# Patient Record
Sex: Female | Born: 1950 | Race: White | Hispanic: No | Smoking: Current some day smoker
Health system: Southern US, Community
[De-identification: ages and names within clinical notes are randomized; demographics above are authoritative.]

## PROBLEM LIST (undated history)

## (undated) DIAGNOSIS — F419 Anxiety disorder, unspecified: Secondary | ICD-10-CM

## (undated) DIAGNOSIS — I10 Essential (primary) hypertension: Secondary | ICD-10-CM

## (undated) DIAGNOSIS — E039 Hypothyroidism, unspecified: Secondary | ICD-10-CM

## (undated) DIAGNOSIS — T7840XA Allergy, unspecified, initial encounter: Secondary | ICD-10-CM

## (undated) DIAGNOSIS — J449 Chronic obstructive pulmonary disease, unspecified: Secondary | ICD-10-CM

## (undated) DIAGNOSIS — E785 Hyperlipidemia, unspecified: Secondary | ICD-10-CM

## (undated) DIAGNOSIS — M810 Age-related osteoporosis without current pathological fracture: Secondary | ICD-10-CM

## (undated) DIAGNOSIS — L409 Psoriasis, unspecified: Secondary | ICD-10-CM

## (undated) DIAGNOSIS — K219 Gastro-esophageal reflux disease without esophagitis: Secondary | ICD-10-CM

## (undated) DIAGNOSIS — G8929 Other chronic pain: Secondary | ICD-10-CM

## (undated) DIAGNOSIS — M1990 Unspecified osteoarthritis, unspecified site: Secondary | ICD-10-CM

## (undated) DIAGNOSIS — M542 Cervicalgia: Secondary | ICD-10-CM

## (undated) HISTORY — DX: Other chronic pain: G89.29

## (undated) HISTORY — DX: Hyperlipidemia, unspecified: E78.5

## (undated) HISTORY — PX: NECK SURGERY: SHX720

## (undated) HISTORY — DX: Cervicalgia: M54.2

## (undated) HISTORY — PX: APPENDECTOMY: SHX54

## (undated) HISTORY — DX: Allergy, unspecified, initial encounter: T78.40XA

## (undated) HISTORY — DX: Chronic obstructive pulmonary disease, unspecified: J44.9

## (undated) HISTORY — DX: Psoriasis, unspecified: L40.9

## (undated) HISTORY — PX: ABDOMINAL HYSTERECTOMY: SHX81

## (undated) HISTORY — DX: Age-related osteoporosis without current pathological fracture: M81.0

## (undated) HISTORY — DX: Unspecified osteoarthritis, unspecified site: M19.90

---

## 1975-02-22 HISTORY — PX: SPINE SURGERY: SHX786

## 1998-05-08 ENCOUNTER — Inpatient Hospital Stay (HOSPITAL_COMMUNITY): Admission: EM | Admit: 1998-05-08 | Discharge: 1998-05-14 | Payer: Self-pay | Admitting: Emergency Medicine

## 1998-05-08 ENCOUNTER — Encounter: Payer: Self-pay | Admitting: Internal Medicine

## 1998-05-09 ENCOUNTER — Encounter: Payer: Self-pay | Admitting: Internal Medicine

## 1998-05-10 ENCOUNTER — Encounter: Payer: Self-pay | Admitting: Internal Medicine

## 1998-05-12 ENCOUNTER — Encounter: Payer: Self-pay | Admitting: Internal Medicine

## 2001-05-24 ENCOUNTER — Other Ambulatory Visit: Admission: RE | Admit: 2001-05-24 | Discharge: 2001-05-24 | Payer: Self-pay | Admitting: Family Medicine

## 2001-05-29 ENCOUNTER — Encounter: Admission: RE | Admit: 2001-05-29 | Discharge: 2001-05-29 | Payer: Self-pay | Admitting: Family Medicine

## 2001-05-29 ENCOUNTER — Encounter: Payer: Self-pay | Admitting: Family Medicine

## 2001-11-29 ENCOUNTER — Encounter: Admission: RE | Admit: 2001-11-29 | Discharge: 2001-11-29 | Payer: Self-pay | Admitting: Family Medicine

## 2001-11-29 ENCOUNTER — Encounter: Payer: Self-pay | Admitting: Family Medicine

## 2002-12-02 ENCOUNTER — Encounter: Admission: RE | Admit: 2002-12-02 | Discharge: 2002-12-02 | Payer: Self-pay | Admitting: Family Medicine

## 2002-12-02 ENCOUNTER — Encounter: Payer: Self-pay | Admitting: Family Medicine

## 2003-12-31 ENCOUNTER — Ambulatory Visit: Payer: Self-pay | Admitting: Family Medicine

## 2004-02-03 ENCOUNTER — Encounter: Admission: RE | Admit: 2004-02-03 | Discharge: 2004-02-03 | Payer: Self-pay | Admitting: Family Medicine

## 2004-02-09 ENCOUNTER — Ambulatory Visit: Payer: Self-pay | Admitting: Family Medicine

## 2004-04-09 ENCOUNTER — Ambulatory Visit: Payer: Self-pay | Admitting: Family Medicine

## 2004-04-16 ENCOUNTER — Ambulatory Visit: Payer: Self-pay | Admitting: Family Medicine

## 2004-05-24 ENCOUNTER — Ambulatory Visit: Payer: Self-pay | Admitting: Family Medicine

## 2004-12-24 ENCOUNTER — Ambulatory Visit: Payer: Self-pay | Admitting: Family Medicine

## 2005-04-25 ENCOUNTER — Ambulatory Visit: Payer: Self-pay | Admitting: Family Medicine

## 2005-04-25 ENCOUNTER — Encounter: Payer: Self-pay | Admitting: Family Medicine

## 2005-04-25 ENCOUNTER — Other Ambulatory Visit: Admission: RE | Admit: 2005-04-25 | Discharge: 2005-04-25 | Payer: Self-pay | Admitting: Family Medicine

## 2005-04-25 LAB — CONVERTED CEMR LAB: Pap Smear: NORMAL

## 2005-04-26 ENCOUNTER — Ambulatory Visit: Payer: Self-pay | Admitting: Internal Medicine

## 2005-05-02 ENCOUNTER — Encounter: Admission: RE | Admit: 2005-05-02 | Discharge: 2005-05-02 | Payer: Self-pay | Admitting: Family Medicine

## 2005-05-18 ENCOUNTER — Ambulatory Visit: Payer: Self-pay | Admitting: Family Medicine

## 2005-06-01 ENCOUNTER — Ambulatory Visit: Payer: Self-pay | Admitting: Family Medicine

## 2005-07-28 ENCOUNTER — Ambulatory Visit: Payer: Self-pay | Admitting: Family Medicine

## 2006-05-04 ENCOUNTER — Encounter: Admission: RE | Admit: 2006-05-04 | Discharge: 2006-05-04 | Payer: Self-pay | Admitting: Family Medicine

## 2007-02-25 ENCOUNTER — Encounter: Payer: Self-pay | Admitting: Family Medicine

## 2007-02-26 ENCOUNTER — Telehealth: Payer: Self-pay | Admitting: Family Medicine

## 2007-02-26 ENCOUNTER — Ambulatory Visit: Payer: Self-pay | Admitting: Family Medicine

## 2007-02-26 DIAGNOSIS — L408 Other psoriasis: Secondary | ICD-10-CM | POA: Insufficient documentation

## 2007-02-26 DIAGNOSIS — L409 Psoriasis, unspecified: Secondary | ICD-10-CM | POA: Insufficient documentation

## 2007-02-26 DIAGNOSIS — Z87891 Personal history of nicotine dependence: Secondary | ICD-10-CM | POA: Insufficient documentation

## 2007-02-26 DIAGNOSIS — F17200 Nicotine dependence, unspecified, uncomplicated: Secondary | ICD-10-CM | POA: Insufficient documentation

## 2007-02-26 DIAGNOSIS — E785 Hyperlipidemia, unspecified: Secondary | ICD-10-CM | POA: Insufficient documentation

## 2007-02-27 ENCOUNTER — Encounter: Payer: Self-pay | Admitting: Family Medicine

## 2007-12-06 ENCOUNTER — Telehealth: Payer: Self-pay | Admitting: Family Medicine

## 2008-02-06 ENCOUNTER — Ambulatory Visit: Payer: Self-pay | Admitting: Family Medicine

## 2008-02-08 ENCOUNTER — Encounter: Admission: RE | Admit: 2008-02-08 | Discharge: 2008-02-08 | Payer: Self-pay | Admitting: Family Medicine

## 2008-02-08 LAB — CONVERTED CEMR LAB
AST: 21 units/L (ref 0–37)
Albumin: 3.9 g/dL (ref 3.5–5.2)
Alkaline Phosphatase: 65 units/L (ref 39–117)
Basophils Absolute: 0 10*3/uL (ref 0.0–0.1)
Basophils Relative: 0.5 % (ref 0.0–3.0)
Bilirubin, Direct: 0.1 mg/dL (ref 0.0–0.3)
Cholesterol: 233 mg/dL (ref 0–200)
Eosinophils Relative: 1.3 % (ref 0.0–5.0)
GFR calc Af Amer: 95 mL/min
HDL: 48.7 mg/dL (ref >39.0)
Monocytes Absolute: 0.8 10*3/uL (ref 0.1–1.0)
Monocytes Relative: 9 % (ref 3.0–12.0)
Neutro Abs: 5.4 10*3/uL (ref 1.4–7.7)
Platelets: 249 10*3/uL (ref 150–400)
RBC: 4.91 M/uL (ref 3.87–5.11)
Sodium: 143 meq/L (ref 135–145)
TSH: 2.08 microintl units/mL (ref 0.35–5.50)
Total CHOL/HDL Ratio: 4.8
Total Protein: 6.8 g/dL (ref 6.0–8.3)
Triglycerides: 135 mg/dL (ref 0–149)
VLDL: 27 mg/dL (ref 0–40)
WBC: 8.9 10*3/uL (ref 4.5–10.5)

## 2008-02-11 ENCOUNTER — Ambulatory Visit: Payer: Self-pay | Admitting: Family Medicine

## 2008-02-12 ENCOUNTER — Encounter (INDEPENDENT_AMBULATORY_CARE_PROVIDER_SITE_OTHER): Payer: Self-pay | Admitting: *Deleted

## 2008-03-06 ENCOUNTER — Encounter (INDEPENDENT_AMBULATORY_CARE_PROVIDER_SITE_OTHER): Payer: Self-pay | Admitting: *Deleted

## 2008-03-07 ENCOUNTER — Ambulatory Visit: Payer: Self-pay | Admitting: Internal Medicine

## 2008-03-17 ENCOUNTER — Encounter: Payer: Self-pay | Admitting: Internal Medicine

## 2008-03-17 ENCOUNTER — Ambulatory Visit: Payer: Self-pay | Admitting: Internal Medicine

## 2008-03-18 ENCOUNTER — Encounter: Payer: Self-pay | Admitting: Internal Medicine

## 2008-06-10 ENCOUNTER — Ambulatory Visit: Payer: Self-pay | Admitting: Family Medicine

## 2008-06-10 DIAGNOSIS — M542 Cervicalgia: Secondary | ICD-10-CM | POA: Insufficient documentation

## 2008-06-16 ENCOUNTER — Encounter: Admission: RE | Admit: 2008-06-16 | Discharge: 2008-06-16 | Payer: Self-pay | Admitting: Family Medicine

## 2008-07-10 ENCOUNTER — Encounter: Admission: RE | Admit: 2008-07-10 | Discharge: 2008-07-10 | Payer: Self-pay | Admitting: Orthopaedic Surgery

## 2008-07-10 ENCOUNTER — Encounter: Payer: Self-pay | Admitting: Family Medicine

## 2008-08-20 ENCOUNTER — Ambulatory Visit (HOSPITAL_COMMUNITY): Admission: RE | Admit: 2008-08-20 | Discharge: 2008-08-21 | Payer: Self-pay | Admitting: Orthopaedic Surgery

## 2010-03-23 NOTE — Consult Note (Signed)
Summary: [facility]   Imported By: Lanelle Bal 04/17/2009 08:51:54  _____________________________________________________________________  External Attachment:    Type:   Image     Comment:   External Document

## 2010-04-09 ENCOUNTER — Ambulatory Visit (INDEPENDENT_AMBULATORY_CARE_PROVIDER_SITE_OTHER): Payer: BC Managed Care – PPO | Admitting: Family Medicine

## 2010-04-09 ENCOUNTER — Encounter: Payer: Self-pay | Admitting: Family Medicine

## 2010-04-09 DIAGNOSIS — J209 Acute bronchitis, unspecified: Secondary | ICD-10-CM

## 2010-04-09 DIAGNOSIS — F17200 Nicotine dependence, unspecified, uncomplicated: Secondary | ICD-10-CM

## 2010-04-20 NOTE — Assessment & Plan Note (Signed)
Summary: COLD/CLE   BCBS   Vital Signs:  Patient profile:   60 year old female Height:      60 inches Weight:      147.50 pounds BMI:     28.91 Temp:     98.1 degrees F oral Pulse rate:   76 / minute Pulse rhythm:   regular BP sitting:   128 / 76  (left arm) Cuff size:   regular  Vitals Entered By: Lewanda Rife LPN (April 09, 2010 8:14 AM) CC: cold,productive cough with green to clear phlegm. hoarsness.   History of Present Illness: thinks she has a bad cold productive cough with green phlegm and wheeze  a little fever in the beginning   nasal symptoms are getting better no facial pain   took a guifenisen cough med and helping some   still a smoker -- but cut down to 3-4 cig per day     Allergies: 1)  Asa 2)  Lipitor  Past History:  Past Medical History: Last updated: 06/10/2008 Hyperlipidemia psoriasis  tab abuse  allergic rhinitis   Past Surgical History: Last updated: 03/22/2008 Cervical fusion ( Dr. Channing Mutters 1997) Hysterectomy- endometriosis.  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 Eyes:  Denies blurring, discharge, and eye irritation. CV:  Denies chest pain or discomfort, lightheadness, and palpitations. Resp:  Complains of cough, sputum productive, and wheezing. GI:  Denies abdominal pain, change in bowel habits,  indigestion, and nausea. Derm:  Denies itching, lesion(s), poor wound healing, and rash. Neuro:  Denies numbness and tingling.  Physical Exam  General:  Well-developed,well-nourished,in no acute distress; alert,appropriate and cooperative throughout examination Head:  normocephalic, atraumatic, and no abnormalities observed.  no sinus tenderness Eyes:  vision grossly intact, pupils equal, pupils round, pupils reactive to light, and no injection.   Ears:  R ear normal and L ear normal.   Nose:  nares are injected and congested bilaterally  Mouth:  pharynx pink and moist.   Lungs:  harsh bs with some rhonchi at bases occ end exp wheeze  air exch is fair not sob no rales  Heart:  Normal rate and regular rhythm. S1 and S2 normal without gallop, murmur, click, rub or other extra sounds.   Impression & Recommendations:  Problem # 1:  BRONCHITIS, ACUTE WITH MILD BRONCHOSPASM (ICD-466.0) Assessment New  will cover with zpak in light of smoking hx  proair as needed -inst in how to use pred taper- disc side eff of this  guifen- cod cough syrup with caution pt advised to update me if symptoms worsen or do not improve - esp if inc wheeze or fever  The following medications were removed from the medication list:    Augmentin 875-125 Mg Tabs (Amoxicillin-pot clavulanate) .Marland Kitchen... 1 by mouth two times a day for 10 days Her updated medication list for this problem includes:    Robitussin Chest Congestion 100 Mg/63ml Syrp (Guaifenesin) ..... Otc as directed.  Zithromax Z-pak 250 Mg Tabs (Azithromycin) .Marland Kitchen... Take by mouth as directed    Proair Hfa 108 (90 Base) Mcg/act Aers (Albuterol sulfate) .Marland Kitchen... 2 puffs up to every 4 hours as needed wheeze    Guaifenesin Ac 100-10 Mg/13ml Syrp (Guaifenesin-codeine) .Marland Kitchen... 1-2 teaspoons by mouth up to every 4-6 hours as needed  Orders: Prescription Created Electronically [id number])  Problem # 2:  TOBACCO ABUSE (ICD-305.1) Assessment: Improved this is improved  with some cut down disc that this is a good time to totally quit- unsure if totally motivated discussed in detail risks of smoking, and possible outcomes including COPD, vascular dz, cancer and also respiratory infections/sinus problems   she voiced understanding   Complete Medication List: 1)  Zocor 40 Mg Tabs (Simvastatin) .Marland Kitchen.. 1 by mouth once daily 2)  Robitussin Chest Congestion 100 Mg/62ml Syrp (Guaifenesin) .... Otc as directed. 3)  Zithromax Z-pak 250 Mg Tabs (Azithromycin) .... Take by mouth as directed 4)  Prednisone 10 Mg Tabs (Prednisone) .... Take by mouth as directed 5)  Proair Hfa 108 (90 Base) Mcg/act Aers (Albuterol sulfate) .... 2 puffs up to every 4 hours as needed wheeze 6)  Guaifenesin Ac 100-10 Mg/31ml Syrp (Guaifenesin-codeine) .Marland Kitchen.. 1-2 teaspoons by mouth up to every 4-6 hours as needed  Patient Instructions: 1)  take zpak as directed 2)  cough syrup with caution- will sedate  3)  take the prednisone as follows:  4)  3 pills once daily for 3 days then 5)  2 pills once daily for 3 days then 6)  1 pill once daily for 3 days then stop  7)  use inhaler as needed  8)  drink fluids and rest  9)  if worse - seek care  10)  if not improving next week update me 11)  keep working on quitting smoking  Prescriptions: GUAIFENESIN AC 100-10 MG/5ML SYRP (GUAIFENESIN-CODEINE) 1-2 teaspoons by mouth up to every 4-6 hours as needed  #120cc x 0   Entered and Authorized by:   Judith Part MD   Signed by:   Judith Part MD on 04/09/2010   Method used:   Print then Give to Patient   RxID:   [id number] PROAIR HFA 108 (90 BASE) MCG/ACT AERS (ALBUTEROL SULFATE) 2 puffs up to every 4 hours as needed wheeze  #1 mdi x 1   Entered and Authorized by:   Judith Part MD   Signed by:   Judith Part MD on 04/09/2010   Method used:   Electronically to        [facility]* (retail)       [address]       [place], [place]  [address]       Ph: [phone]       Fax:  [phone]   RxID:   [id number] PREDNISONE 10 MG TABS (PREDNISONE) take by mouth as directed  #18 x 0   Entered and Authorized by:   Judith Part MD   Signed by:   Judith Part MD on 04/09/2010   Method used:   Electronically to        [facility]* (retail)       [address]       [place], [place]  [address]       Ph: [phone]       Fax: [phone]   RxID:   [id number] ZITHROMAX Z-PAK 250 MG TABS (AZITHROMYCIN) take by mouth as directed  #1 pack x 0  Entered and Authorized by:   Judith Part MD   Signed by:   Judith Part MD on 04/09/2010   Method used:   Electronically to        [facility]* (retail)       [address]       [place], [place]  [address]       Ph: [phone]       Fax: [phone]   RxID:   [id number]    Orders Added: 1)  Prescription Created Electronically [G8553] 2)  Est. Patient Level III [94854]    Current Allergies (reviewed today): ASA LIPITOR

## 2010-04-29 ENCOUNTER — Telehealth (INDEPENDENT_AMBULATORY_CARE_PROVIDER_SITE_OTHER): Payer: Self-pay | Admitting: *Deleted

## 2010-04-30 ENCOUNTER — Other Ambulatory Visit: Payer: Self-pay | Admitting: Family Medicine

## 2010-04-30 ENCOUNTER — Encounter (INDEPENDENT_AMBULATORY_CARE_PROVIDER_SITE_OTHER): Payer: Self-pay | Admitting: *Deleted

## 2010-04-30 ENCOUNTER — Other Ambulatory Visit (INDEPENDENT_AMBULATORY_CARE_PROVIDER_SITE_OTHER): Payer: BC Managed Care – PPO

## 2010-04-30 DIAGNOSIS — Z0000 Encounter for general adult medical examination without abnormal findings: Secondary | ICD-10-CM

## 2010-04-30 DIAGNOSIS — E785 Hyperlipidemia, unspecified: Secondary | ICD-10-CM

## 2010-04-30 LAB — LDL CHOLESTEROL, DIRECT: Direct LDL: 136.5 mg/dL

## 2010-04-30 LAB — HEPATIC FUNCTION PANEL
AST: 19 U/L (ref 0–37)
Albumin: 3.6 g/dL (ref 3.5–5.2)
Alkaline Phosphatase: 70 U/L (ref 39–117)
Bilirubin, Direct: 0.1 mg/dL (ref 0.0–0.3)

## 2010-04-30 LAB — BASIC METABOLIC PANEL
BUN: 9 mg/dL (ref 6–23)
CO2: 26 mEq/L (ref 19–32)
Chloride: 108 mEq/L (ref 96–112)
Potassium: 3.7 mEq/L (ref 3.5–5.1)
Sodium: 143 mEq/L (ref 135–145)

## 2010-04-30 LAB — CBC WITH DIFFERENTIAL/PLATELET
Basophils Absolute: 0 10*3/uL (ref 0.0–0.1)
Basophils Relative: 0.5 % (ref 0.0–3.0)
Eosinophils Absolute: 0.2 10*3/uL (ref 0.0–0.7)
Lymphs Abs: 2.1 10*3/uL (ref 0.7–4.0)
Monocytes Absolute: 0.7 10*3/uL (ref 0.1–1.0)
Monocytes Relative: 9.4 % (ref 3.0–12.0)
Neutro Abs: 4 10*3/uL (ref 1.4–7.7)

## 2010-04-30 LAB — LIPID PANEL
Cholesterol: 235 mg/dL — ABNORMAL HIGH (ref 0–200)
HDL: 35.1 mg/dL — ABNORMAL LOW (ref >39.00)
Triglycerides: 318 mg/dL — ABNORMAL HIGH (ref 0.0–149.0)
VLDL: 63.6 mg/dL — ABNORMAL HIGH (ref 0.0–40.0)

## 2010-04-30 LAB — TSH: TSH: 2.73 u[IU]/mL (ref 0.35–5.50)

## 2010-05-03 ENCOUNTER — Encounter (INDEPENDENT_AMBULATORY_CARE_PROVIDER_SITE_OTHER): Payer: BC Managed Care – PPO | Admitting: Family Medicine

## 2010-05-03 ENCOUNTER — Encounter: Payer: Self-pay | Admitting: Family Medicine

## 2010-05-03 ENCOUNTER — Other Ambulatory Visit: Payer: Self-pay | Admitting: Family Medicine

## 2010-05-03 DIAGNOSIS — F329 Major depressive disorder, single episode, unspecified: Secondary | ICD-10-CM

## 2010-05-03 DIAGNOSIS — Z1231 Encounter for screening mammogram for malignant neoplasm of breast: Secondary | ICD-10-CM

## 2010-05-03 DIAGNOSIS — F3289 Other specified depressive episodes: Secondary | ICD-10-CM | POA: Insufficient documentation

## 2010-05-03 DIAGNOSIS — Z0000 Encounter for general adult medical examination without abnormal findings: Secondary | ICD-10-CM

## 2010-05-03 DIAGNOSIS — Z23 Encounter for immunization: Secondary | ICD-10-CM

## 2010-05-04 NOTE — Progress Notes (Signed)
----   Converted from flag ---- ---- 04/29/2010 9:51 AM, Colon Flattery Tower MD wrote: please check wellness and lipid for v70.0 and 272 thanks  ---- 04/28/2010 7:56 AM, Liane Comber CMA (AAMA) wrote: Lab orders please! Good Morning! This pt is scheduled for cpx labs Friday, which labs to draw and dx codes to use? Thanks Tasha ------------------------------

## 2010-05-05 ENCOUNTER — Ambulatory Visit
Admission: RE | Admit: 2010-05-05 | Discharge: 2010-05-05 | Disposition: A | Discharge status: Home / Self Care | Payer: BC Managed Care – PPO | Source: Ambulatory Visit | Attending: Family Medicine | Admitting: Family Medicine

## 2010-05-05 DIAGNOSIS — Z1231 Encounter for screening mammogram for malignant neoplasm of breast: Secondary | ICD-10-CM

## 2010-05-05 LAB — HM MAMMOGRAPHY: HM Mammogram: NORMAL

## 2010-05-10 ENCOUNTER — Encounter: Payer: Self-pay | Admitting: Family Medicine

## 2010-05-11 NOTE — Assessment & Plan Note (Signed)
Summary: cpx   Vital Signs:  Patient profile:   61 year old female Height:      60 inches Weight:      145.25 pounds BMI:     28.47 Temp:     98.9 degrees F oral Pulse rate:   84 / minute Pulse rhythm:   regular BP sitting:   140 / 76  (left arm) Cuff size:   regular  Vitals Entered By: Lewanda Rife LPN (May 03, 2010 2:34 PM)  Serial Vital Signs/Assessments:  Time      Position  BP       Pulse  Resp  Temp     By                     130/70                         Judith Part MD  CC: CPX LMP partial hyst 1980   History of Present Illness: here for health mt exam and to review chronic med problems and new depression  is not doing much - is retired - tries to stay as busy as possible  not very active - no exercise  chronic neck pain keeps her down - had a fusion and not much rom  not interested in pain clinic    wt is down 2 lb with bmi of 28 140/76 bp first check   lipids are changed with inc in trig and dec in LDL  trig 318  and HDL of 35 and LDL 136 (down from 161) has not been taking her zocor for 1 year -- for muscle pain  not a big volume eater -- and eating less fatty foods    tab status - about the same - less than pack a day  does not honestly know if she wants to quit or not  tries to smoke less and less -- does not smoke in house   hyst - past with 1 ov remaining  no pain or bleeding  pap 07  her daughters are here and wanted to talk about new depression worse since retiring and loosing her father  no motivation  will not go out much no smiling or facial change no joy no SI however very poor appetitie   has never had anything like this before  dexa nl 09 (this was 3rd nl dexa) no broken bones still smokes   colon polyp 1/10-- for re check in 5 years   mam 12/09-- wants to schedule  self exam no lumps  Td 07    Allergies: 1)  Asa 2)  Lipitor 3)  Zocor  Past History:  Past Surgical History: Last updated: 03/22/2008 Cervical  fusion ( Dr. Channing Mutters 1997) Hysterectomy- endometriosis.  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 Eyes:  Denies blurring and eye irritation. CV:  Denies chest pain or discomfort, lightheadness, palpitations, and shortness of breath with exertion. Resp:  Denies cough, shortness of breath, and wheezing. GI:  Denies abdominal pain, change in bowel habits, and nausea. GU:  Denies abnormal vaginal bleeding and discharge. MS:  Denies muscle aches and cramps. Derm:  Denies itching, lesion(s), poor wound healing, and rash. Neuro:  Denies numbness and tingling. Psych:  Complains of depression, easily tearful, and irritability; denies anxiety, panic attacks, sense of great danger, and suicidal thoughts/plans. Endo:  Denies cold intolerance, excessive thirst, excessive urination, and heat intolerance. Heme:  Denies abnormal bruising and bleeding.  Physical Exam  General:  well appearing but generally fatigued with dull affect Head:  normocephalic, atraumatic, and no abnormalities observed.   Eyes:  vision grossly intact, pupils equal, pupils round, and pupils reactive to light.  no conjunctival pallor, injection or icterus  Ears:  R ear normal  and L ear normal.   Nose:  no nasal discharge.   Mouth:  pharynx pink and moist.   Neck:  supple with full rom and no masses or thyromegally, no JVD or carotid bruit  Chest Wall:  No deformities, masses, or tenderness noted. Lungs:  CTA with diffusely distant bs  no wheeze  Heart:  Normal rate and regular rhythm. S1 and S2 normal without gallop, murmur, click, rub or other extra sounds. Abdomen:  Bowel sounds positive,abdomen soft and non-tender without masses, organomegaly or hernias noted. no renal bruits  Msk:  No deformity or scoliosis noted of thoracic or lumbar spine.  no acute joint changes  Pulses:  R and L carotid,radial,femoral,dorsalis pedis and posterior tibial pulses are full and equal bilaterally Extremities:  No clubbing, cyanosis, edema, or deformity noted with normal full range of motion of all joints.   Neurologic:  sensation intact to light touch, gait normal, and DTRs symmetrical and normal.   Skin:  Intact without suspicious lesions or rashes Cervical Nodes:  No lymphadenopathy noted Inguinal Nodes:  No significant adenopathy Psych:  dulled affect very little facial exp good relationship to family members present  no SI   Impression & Recommendations:  Problem # 1:  HEALTH MAINTENANCE EXAM (ICD-V70.0) Assessment Comment Only reviewed health habits including diet, exercise and skin cancer prevention reviewed health maintenance list and family history spent much of visit disc depression with pt and her daughters  rev wellness labs in detail will do bimanual pelvic exam at f/u pneumovax today then will get flu shot at pharmacy  Problem # 2:  OTHER SCREENING MAMMOGRAM (ICD-V76.12) Assessment: Comment Only sched mammogram will do breast exam at f/u Orders: Radiology Referral (Radiology)  Problem # 3:  TOBACCO ABUSE (ICD-305.1) Assessment: Unchanged discussed in detail risks of smoking, and possible outcomes including COPD, vascular dz, cancer and also  respiratory infections/sinus problems  pt states she is not ready to quit   Problem # 4:  HYPERLIPIDEMIA (ICD-272.4) Assessment: Improved  off statin LDL is actually down(better diet) and trig up  rev labs rev low fat diet in detail The following medications were removed from the medication list:    Zocor 40 Mg Tabs (Simvastatin) .Marland Kitchen... 1 by mouth once daily  Labs Reviewed: SGOT: 19 (04/30/2010)   SGPT: 11 (04/30/2010)   HDL:35.10 (04/30/2010), 48.7 (02/06/2008)  LDL:DEL (02/06/2008)  Chol:235 (04/30/2010), 233 (02/06/2008)  Trig:318.0 (04/30/2010), 135 (02/06/2008)  Problem # 5:  DEPRESSION (ICD-311) Assessment: New disc this with pt and her daughters  in length  poor motivation  loss and many changes lately  tearful at times and loss of emotions this has also caused some short term mem loss spent 25 minutes face to face time with pt , over 50% of which was spent on counseling and coordination of care  f/u 2 mo - consider med if not imp (pt is fearful of med) Orders: Psychology Referral (Psychology)  Complete Medication List: 1)  Proair Hfa 108 (90 Base) Mcg/act Aers (Albuterol sulfate) .... 2 puffs up to every 4 hours as needed wheeze  Other Orders: Pneumococcal Vaccine (96295) Admin 1st Vaccine (28413)  Patient Instructions: 1)  the current recommendation for calcium intake is 1200-1500 mg daily with 1000 IU of vitamin D  2)  I highly recommend you get a flu shot at a pharmacy today  3)  pneumonia vaccine  4)  if you are interested in a shingles vaccine in early summer - call your insurance company and then call us to schedule it  5)  you can raise your HDL (good cholesterol) by increasing exercise and eating omega 3 fatty acid supplement like fish oil or flax seed oil over the counter 6)  you can lower LDL (bad cholesterol) by limiting saturated fats in diet like red meat, fried foods, egg yolks, fatty breakfast meats, high fat dairy products and shellfish  7)  we will do  counseling referral at check out  8)  we will do mammogram referral at check out  9)  follow up in 2 months - we will do breast and pelvic exam that day as well  10)  goal- to exercise 5 days a week for at least 20 minutes   Orders Added: 1)  Pneumococcal Vaccine [90732] 2)  Admin 1st Vaccine [90471] 3)  Psychology Referral [Psychology] 4)  Radiology Referral [Radiology] 5)  Est. Patient 40-64 years [99396] 6)  Est. Patient Level III [24401]   Immunizations Administered:  Pneumonia Vaccine:    Vaccine Type: Pneumovax    Site: left deltoid    Mfr: Merck    Dose: 0.5 ml    Route: IM    Given by: Lewanda Rife LPN    Exp. Date: 07/16/2011    Lot #: [id number]    VIS given: 01/26/09 version given May 03, 2010.   Immunizations Administered:  Pneumonia Vaccine:    Vaccine Type: Pneumovax    Site: left deltoid    Mfr: Merck    Dose: 0.5 ml    Route: IM    Given by: Lewanda Rife LPN    Exp. Date: 07/16/2011    Lot #: [id number]    VIS given: 01/26/09 version given May 03, 2010.  Current Allergies (reviewed today): ASA LIPITOR ZOCOR

## 2010-05-12 ENCOUNTER — Ambulatory Visit (INDEPENDENT_AMBULATORY_CARE_PROVIDER_SITE_OTHER): Payer: BC Managed Care – PPO | Admitting: Psychology

## 2010-05-12 DIAGNOSIS — F4323 Adjustment disorder with mixed anxiety and depressed mood: Secondary | ICD-10-CM

## 2010-05-20 NOTE — Letter (Signed)
Summary: Results Follow up Letter  [facility]  [address] [place], [place] [address]   Phone: [phone]  Fax: [phone]    05/10/2010 MRN: [id number]    Tammy Neal [address] [place], [place]  [address]  [place]    Dear Ms. Kraner,  The following are the results of your recent test(s):  Test         Result    Pap Smear:        Normal _____  Not Normal _____ Comments: ______________________________________________________ Cholesterol: LDL(Bad cholesterol):         Your goal is less than:         HDL (Good cholesterol):       Your goal is more than: Comments:  ______________________________________________________ Mammogram:        Normal __X___  Not Normal _____ Comments:Repeat in one year.   ___________________________________________________________________ Hemoccult:        Normal _____  Not normal _______ Comments:    _____________________________________________________________________ Other Tests:    We routinely do not discuss normal results over the telephone.  If you desire a copy of the results, or you have any questions about this information we can discuss them at your next office visit.   Sincerely,    Idamae Schuller Lisandro Meggett,MD  MT/ri

## 2010-05-25 ENCOUNTER — Ambulatory Visit: Payer: BC Managed Care – PPO | Admitting: Psychology

## 2010-05-31 LAB — CBC
Hemoglobin: 16.1 g/dL — ABNORMAL HIGH (ref 12.0–15.0)
MCHC: 34.6 g/dL (ref 30.0–36.0)
MCV: 93.9 fL (ref 78.0–100.0)
Platelets: 215 10*3/uL (ref 150–400)

## 2010-05-31 LAB — BASIC METABOLIC PANEL
BUN: 7 mg/dL (ref 6–23)
Calcium: 9.3 mg/dL (ref 8.4–10.5)
Chloride: 108 mEq/L (ref 96–112)
Creatinine, Ser: 0.64 mg/dL (ref 0.4–1.2)
GFR calc Af Amer: >60 mL/min (ref >60)
GFR calc non Af Amer: >60 mL/min (ref >60)
Glucose, Bld: 96 mg/dL (ref 70–99)

## 2010-06-01 ENCOUNTER — Ambulatory Visit (INDEPENDENT_AMBULATORY_CARE_PROVIDER_SITE_OTHER): Payer: BC Managed Care – PPO | Admitting: Psychology

## 2010-06-01 DIAGNOSIS — F4323 Adjustment disorder with mixed anxiety and depressed mood: Secondary | ICD-10-CM

## 2010-06-08 ENCOUNTER — Ambulatory Visit (INDEPENDENT_AMBULATORY_CARE_PROVIDER_SITE_OTHER): Payer: BC Managed Care – PPO | Admitting: Psychology

## 2010-06-08 DIAGNOSIS — F4323 Adjustment disorder with mixed anxiety and depressed mood: Secondary | ICD-10-CM

## 2010-06-23 ENCOUNTER — Ambulatory Visit (INDEPENDENT_AMBULATORY_CARE_PROVIDER_SITE_OTHER): Payer: BC Managed Care – PPO | Admitting: Psychology

## 2010-06-23 DIAGNOSIS — F4323 Adjustment disorder with mixed anxiety and depressed mood: Secondary | ICD-10-CM

## 2010-06-26 ENCOUNTER — Encounter: Payer: Self-pay | Admitting: Family Medicine

## 2010-07-01 IMAGING — CR DG CERVICAL SPINE COMPLETE 4+V
5 series · 5 of 5 positions shown · non-contrast
Comparison: None.

CLINICAL DATA: Neck pain.  Neck fusion 7887.

CERVICAL SPINE - COMPLETE 4+ VIEW

[view not recorded (1 of 5)]
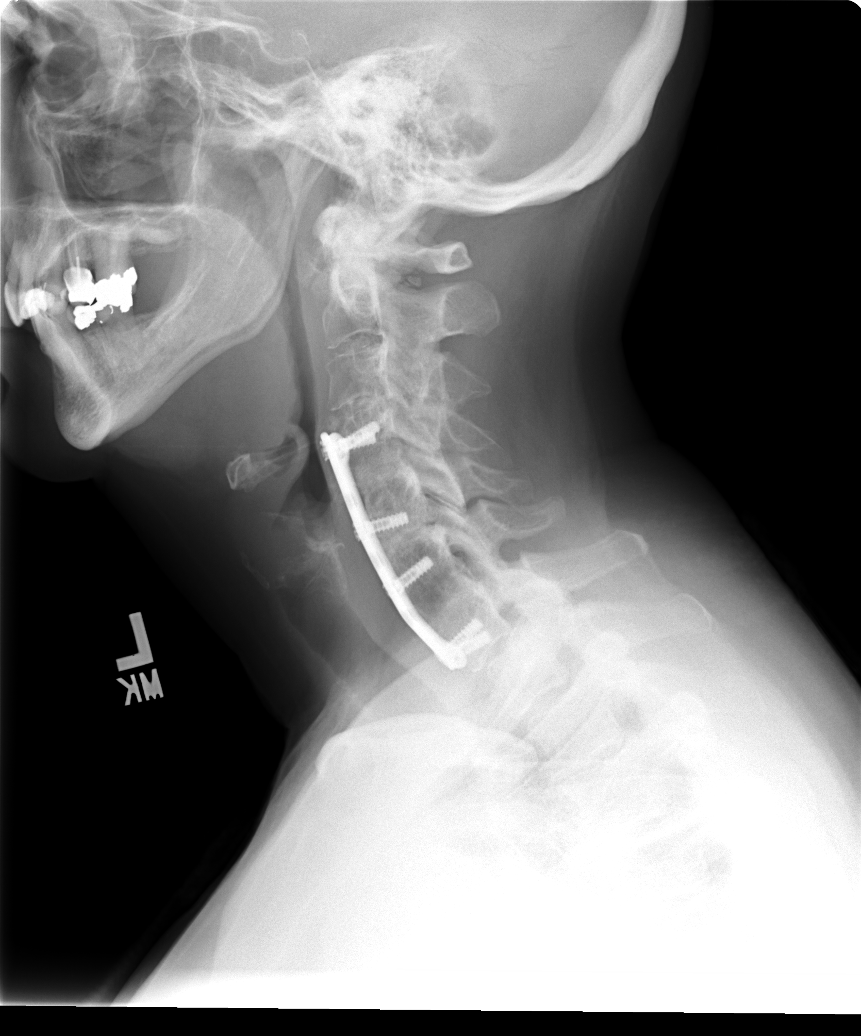

[view not recorded (2 of 5)]
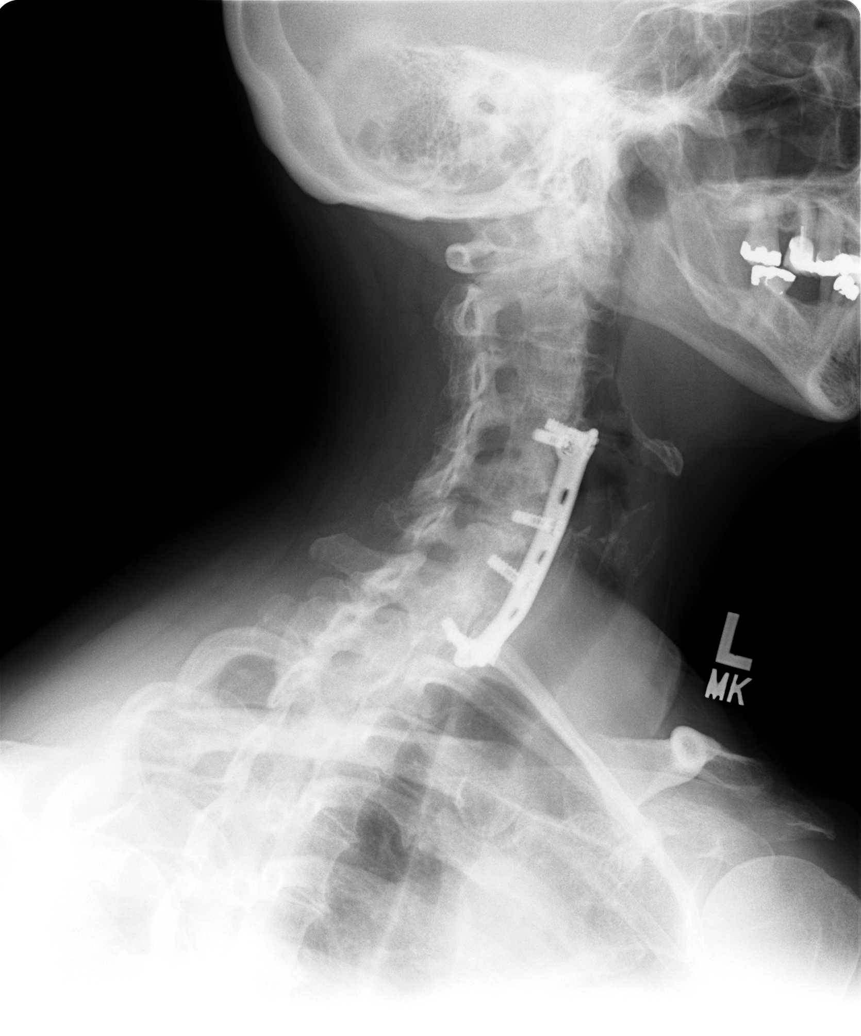

[view not recorded (3 of 5)]
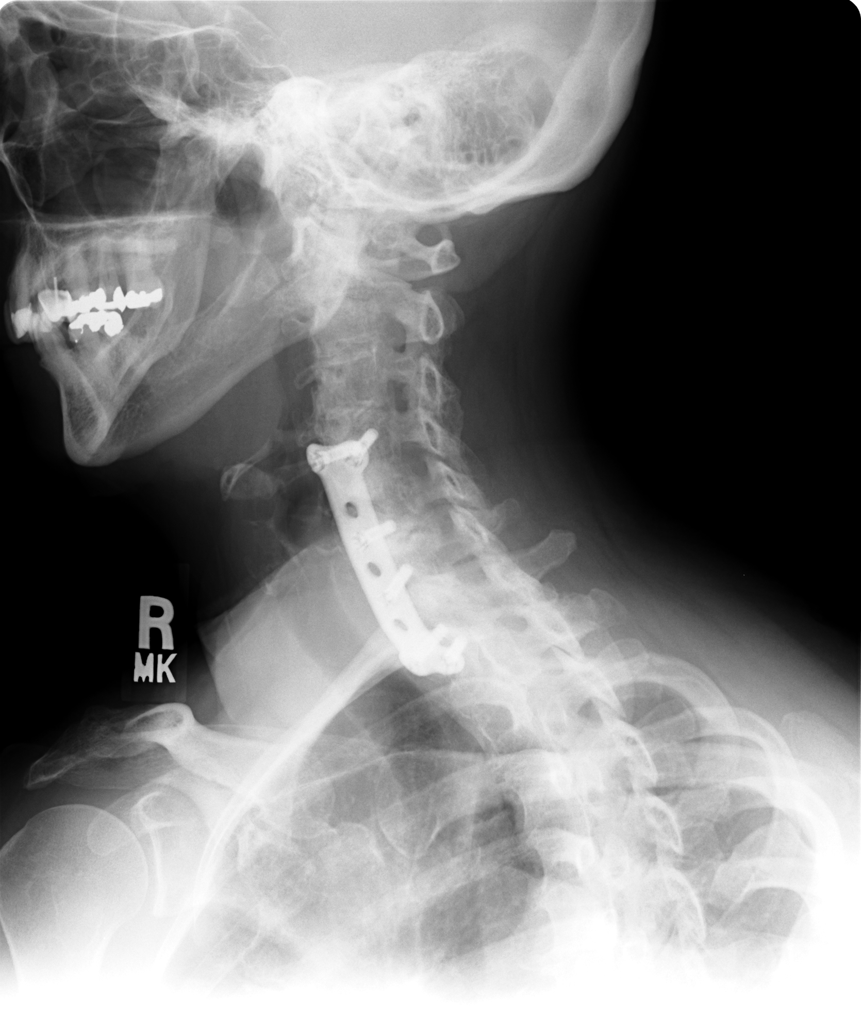

[view not recorded (4 of 5)]
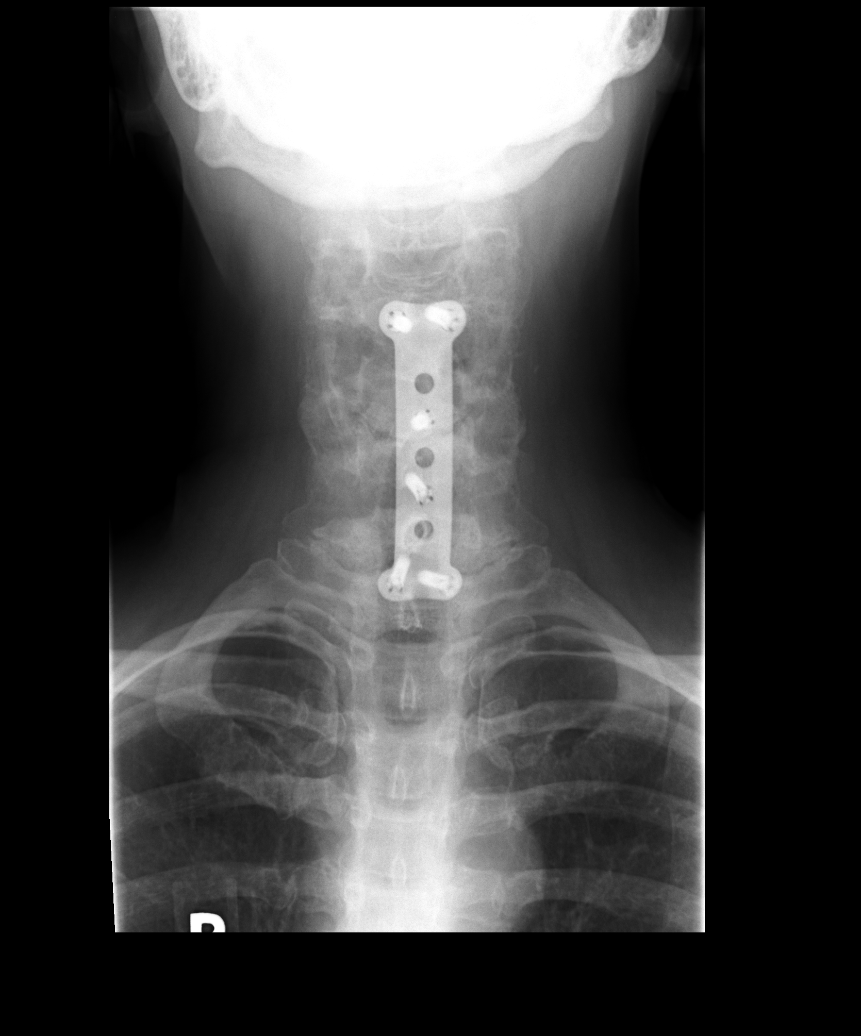

[view not recorded (5 of 5)]
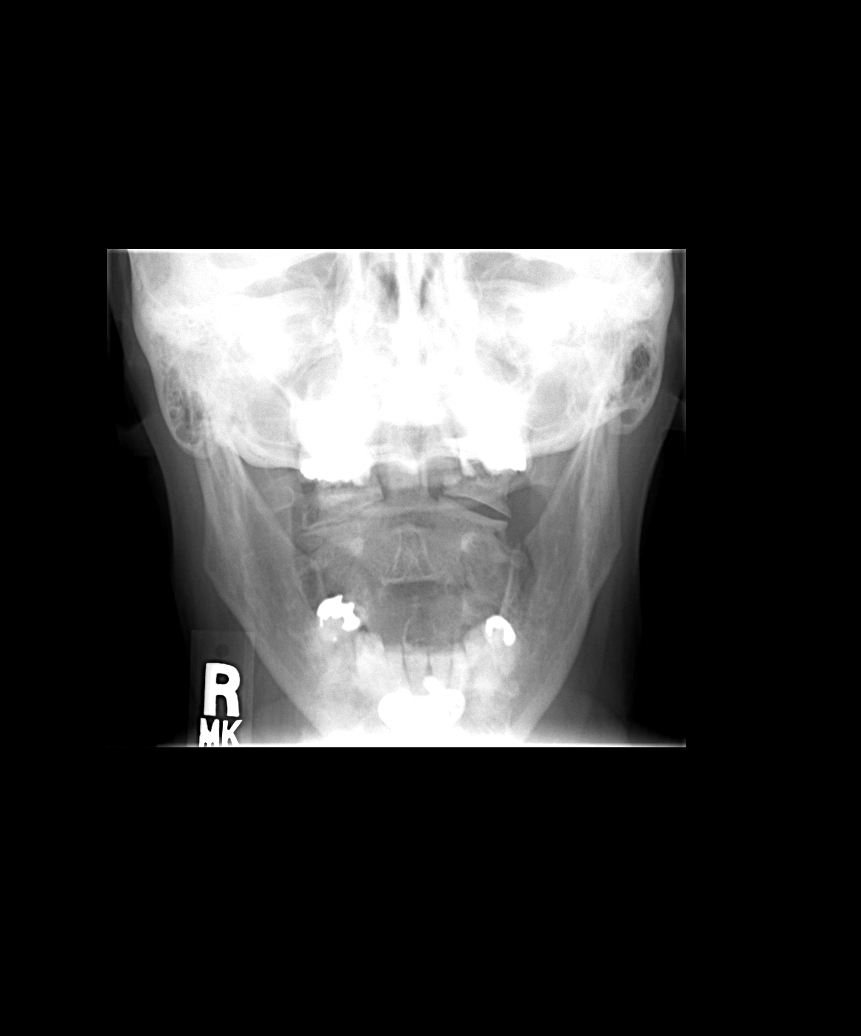

[5 of 5 positions shown; findings below may reference images not displayed]

FINDINGS: 3 mm of anterior slip of C7 on T1.

Anterior plate and interbody fusion of C4-C7.  There appears to be
solid interbody fusion at C4-5  and C6-7.  Question pseudoarthrosis
at C5-6.  This could be better diagnosed with CT scan.  Mild
foraminal narrowing on the left at C3-4 due to uncovertebral
spurring and facet overgrowth.  There is mild foraminal narrowing
on the left at C5-6 due to uncovertebral spurring.  No fracture is
identified.
IMPRESSION: ACDF of C4-C7.  There is possible pseudoarthrosis at C5-6 with
lucency at  the disc space.  This could be better evaluated with
CT.

3 mm anterior slip of C7 on T1 likely due to disc and facet
degeneration.

## 2010-07-05 ENCOUNTER — Encounter: Payer: Self-pay | Admitting: Family Medicine

## 2010-07-05 ENCOUNTER — Ambulatory Visit (INDEPENDENT_AMBULATORY_CARE_PROVIDER_SITE_OTHER): Payer: BC Managed Care – PPO | Admitting: Family Medicine

## 2010-07-05 DIAGNOSIS — Z01419 Encounter for gynecological examination (general) (routine) without abnormal findings: Secondary | ICD-10-CM | POA: Insufficient documentation

## 2010-07-05 DIAGNOSIS — F17200 Nicotine dependence, unspecified, uncomplicated: Secondary | ICD-10-CM

## 2010-07-05 DIAGNOSIS — F329 Major depressive disorder, single episode, unspecified: Secondary | ICD-10-CM

## 2010-07-05 NOTE — Assessment & Plan Note (Signed)
Disc in detail risks of smoking and possible outcomes including copd, vascular/ heart disease, cancer , respiratory and sinus infections  Pt voices understanding  ? If ready to quit

## 2010-07-05 NOTE — Assessment & Plan Note (Signed)
This is much imp after counseling Disc exercise- and better self care  Overall - feels she does not need med  Disc red flags to watch out for for depression

## 2010-07-05 NOTE — Progress Notes (Signed)
Subjective:    Patient ID: Tammy Neal, female    DOB: 1950-12-18, 60 y.o.   MRN: [id number]  HPI Here for f/u of depression and also gyn exam No longer sad or down  Saw counselor- thinks it was helpful  Was released  Got some perspective  Does not need med   No regular exercise- is everyone's caretaker - and this help  Will think about it - does not like to walk -- unless she has a partner Is remodeling her house - painting     Hx of hyst with oophrectomy on one side No symptoms  Needs breast exam  Mam last was normal   Depression disc at last visit Was ref to counseling   Cut way back on smoking - less than 1ppd and not in the house  Is motivated somewhat - to keep going -- has to be ready   Past Medical History  Diagnosis Date  . Hyperlipidemia   . Depression   . Allergy     allergic rhinitis  . Psoriasis   . Neck pain, chronic     with fusion    Past Surgical History  Procedure Date  . Spine surgery 1977    cervical fusion Dr Channing Mutters  . Abdominal hysterectomy     endometriosis 1 ovary left  . Appendectomy     History   Social History  . Marital Status: Single    Spouse Name: N/A    Number of Children: N/A  . Years of Education: N/A   Occupational History  . Not on file.   Social History Main Topics  . Smoking status: Current Everyday Smoker -- 1.0 packs/day  . Smokeless tobacco: Not on file  . Alcohol Use:   . Drug Use:   . Sexually Active:    Other Topics Concern  . Not on file   Social History Narrative  . No narrative on file      Review of Systems ROS  Review of Systems  Constitutional: Negative for fever, appetite change, fatigue and unexpected weight change.  Eyes: Negative for pain and visual disturbance.  Respiratory: Negative for cough and shortness of breath.   Cardiovascular: Negative.   Gastrointestinal: Negative for nausea, diarrhea and constipation.  Genitourinary: Negative for urgency and frequency., vag d/c/ pelvic  pain or bleeding  Skin: Negative for pallor.  Neurological: Negative for weakness, light-headedness, numbness and headaches.  Hematological: Negative for adenopathy. Does not bruise/bleed easily.  Psychiatric/Behavioral: pos for depression that is improved . The patient is not nervous/anxious.      .     Objective:   Physical Exam  Vitals reviewed. Constitutional: She appears well-developed and well-nourished. No distress.  HENT:  Head: Normocephalic and atraumatic.  Neck: No JVD present. No thyromegaly present.  Cardiovascular: Normal rate, regular rhythm and normal heart sounds.   Pulmonary/Chest: Effort normal and breath sounds normal.       Diffusely distant bs   Genitourinary: Vagina normal. No breast swelling, tenderness, discharge or bleeding. No vaginal discharge found.       Nl bimanual exam without masses or tenderness  Musculoskeletal: She exhibits no edema.  Lymphadenopathy:    She has no cervical adenopathy.  Skin: Skin is warm and dry.  Psychiatric: She has a normal mood and affect.       Improved affect Smiles and more facial expression  Good eye contact and comm skills           Assessment & Plan:

## 2010-07-05 NOTE — Patient Instructions (Addendum)
I'm glad your depression is improved  Let me know if symptom re-occur Exercise is important for body and brain-- try to do something 20 minutes per day  Gyn exam done today- normal  Keep thinking about quitting smoking

## 2010-07-05 NOTE — Assessment & Plan Note (Signed)
In smoker who had hyst in past No symptoms  1 ovary remains Nl bimanual exam and breast exam

## 2010-07-06 ENCOUNTER — Ambulatory Visit (INDEPENDENT_AMBULATORY_CARE_PROVIDER_SITE_OTHER): Payer: BC Managed Care – PPO | Admitting: Family Medicine

## 2010-07-06 DIAGNOSIS — Z23 Encounter for immunization: Secondary | ICD-10-CM

## 2010-07-06 DIAGNOSIS — Z2911 Encounter for prophylactic immunotherapy for respiratory syncytial virus (RSV): Secondary | ICD-10-CM

## 2010-07-06 NOTE — Op Note (Signed)
Tammy Neal, BROWNLEY               ACCOUNT NO.:  [id number]   MEDICAL RECORD NO.:  [id number]          PATIENT TYPE:  OIB   LOCATION:  5016                         FACILITY:  [facility]   PHYSICIAN:  Mark C. Ophelia Charter, M.D.    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   PROCEDURE:  C5-C6 posterior cervical fusion and wiring, iliac crest,  bone marrow aspirate, and Vitoss.   SURGEON:  Mark C. Ophelia Charter, MD   ASSISTANT:  Wende Neighbors, PA-C   ANESTHESIA:  General plus Marcaine local.   ESTIMATED BLOOD LOSS:  Minimal.   A 60 year old female, status post anterior cervical fusion with  persistent pain and symptoms, and CT scan documenting motion at C5-6  level with flexion and on flexion/extension x-rays.  She had been fused  from C4-C7, and CT scan showed that the C5-6 level had not fused, but  the other levels were solid.   PROCEDURE:  After induction of general anesthesia orotracheal  intubation, preoperative Ancef prophylaxis, positioning prone on  horseshoe head holder, standard prepping and draping with arms tucked to  side.  Careful padding over the ulnar nerve pads underneath the  shoulders, area squared with 10/10 drapes, Betadine Vi-Drape, iliac  crest was prepped as well on the right side for aspiration for bone  marrow aspirate near the end of the case.  Area squared with towels,  sterile skin marker, Betadine Vi-Drape, laminectomy sheet, sterile Mayo  stand at the head, and surgical time-out procedure checklist was  completed.  Incision was made to midline.  Bifid C5 was identified.  Subperiosteal dissection and there was no motion between C6 and C7, and  no motion between C4-C5.  There was more motion with mobility at the C5-  6 with a few millimeters.  Cross-table lateral x-ray  with the Kocher  clamp placed between 5 and 6 and along between 4 and 5 was obtained  confirming appropriate level.  Anterior hardware was identified as well.  Iliac crest was aspirated through the Vi-Drape after stab incision with  15 blade using the sharp trocar first to the bone, then dull, and then  progressively penetrating taking 2-3 mL of bone marrow aspirate as the  tip of the trocar was moved to optimize ostial progenitor cells.  A 10  mL were taken, put on 10 mL Vitoss strip, which was then covered.  A 24-  gauge wire was twisted into a cable using 3 strands.  It was passed  below C6 and there was a minimal gap between C5 and C6, and C4 was more  prominent, so wire was passed over the top of C4.  There was no motion  at C4-5 from the previous fusion, however, C5 had some partial ankylosis  to the spinous process and there did not appear to be a gap between 4  and 5.  Wire was twisted down securely.  The posterior lamina was pared  with 4-mm bur.  The slope of C4 was rather sharp and notch had been cut  in the top C4 for the wire was passed.  It was twisted down, bent over,  and cut.  Using towel clips or Kochers, there was no motion present at  the C5-6 level after tightening down the wire.  Vitoss was cut into 2  pieces, placed down over the prepared bony surface of the lamina, and  then sequential layer closure of 0 Vicryl in the deep fascia, 2-0 Vicryl  subcutaneous tissue, and then subcuticular skin closure.  Marcaine  infiltration postop.  Tincture of benzoin, Steri-  Strips, postop dressing, and soft cervical collar.  The patient  tolerated the procedure well and was transferred to recovery room in  stable condition.  Instrument count and needle count were correct, and  surgical time-out checklist was completed at the time of closure.      Mark C. Ophelia Charter, M.D.  Electronically Signed     Veverly Fells. Ophelia Charter, M.D.  Electronically Signed    MCY/MEDQ  D:  09/24/2008  T:   09/25/2008  Job:  [id number]

## 2010-07-08 NOTE — Progress Notes (Signed)
  Subjective:    Patient ID: Tammy Neal, female    DOB: [birth year], 60 y.o.   MRN: [id number]  HPI Here for vaccine only   Review of Systems     Objective:   Physical Exam        Assessment & Plan:

## 2011-04-04 ENCOUNTER — Other Ambulatory Visit: Payer: Self-pay | Admitting: Family Medicine

## 2011-04-04 DIAGNOSIS — Z1231 Encounter for screening mammogram for malignant neoplasm of breast: Secondary | ICD-10-CM

## 2011-05-09 ENCOUNTER — Ambulatory Visit
Admission: RE | Admit: 2011-05-09 | Discharge: 2011-05-09 | Disposition: A | Discharge status: Home / Self Care | Payer: BC Managed Care – PPO | Source: Ambulatory Visit | Attending: Family Medicine | Admitting: Family Medicine

## 2011-05-09 DIAGNOSIS — Z1231 Encounter for screening mammogram for malignant neoplasm of breast: Secondary | ICD-10-CM

## 2011-05-11 ENCOUNTER — Encounter: Payer: Self-pay | Admitting: *Deleted

## 2011-06-01 ENCOUNTER — Ambulatory Visit (INDEPENDENT_AMBULATORY_CARE_PROVIDER_SITE_OTHER): Payer: BC Managed Care – PPO | Admitting: Family Medicine

## 2011-06-01 ENCOUNTER — Ambulatory Visit (INDEPENDENT_AMBULATORY_CARE_PROVIDER_SITE_OTHER)
Admission: RE | Admit: 2011-06-01 | Discharge: 2011-06-01 | Disposition: A | Discharge status: Home / Self Care | Payer: BC Managed Care – PPO | Source: Ambulatory Visit | Attending: Family Medicine | Admitting: Family Medicine

## 2011-06-01 ENCOUNTER — Encounter: Payer: Self-pay | Admitting: Family Medicine

## 2011-06-01 VITALS — BP 120/68 | HR 63 | Temp 98.0°F | Ht 60.0 in | Wt 148.8 lb

## 2011-06-01 DIAGNOSIS — M79609 Pain in unspecified limb: Secondary | ICD-10-CM

## 2011-06-01 DIAGNOSIS — M79675 Pain in left toe(s): Secondary | ICD-10-CM

## 2011-06-01 LAB — CBC WITH DIFFERENTIAL/PLATELET
Basophils Absolute: 0 10*3/uL (ref 0.0–0.1)
Basophils Relative: 0.5 % (ref 0.0–3.0)
Eosinophils Absolute: 0.1 10*3/uL (ref 0.0–0.7)
HCT: 44.4 % (ref 36.0–46.0)
Hemoglobin: 15 g/dL (ref 12.0–15.0)
Lymphs Abs: 1.9 10*3/uL (ref 0.7–4.0)
MCHC: 33.7 g/dL (ref 30.0–36.0)
Neutro Abs: 6.3 10*3/uL (ref 1.4–7.7)
Neutrophils Relative %: 69.1 % (ref 43.0–77.0)
RBC: 4.67 Mil/uL (ref 3.87–5.11)
RDW: 13.7 % (ref 11.5–14.6)

## 2011-06-01 LAB — URIC ACID: Uric Acid, Serum: 5.2 mg/dL (ref 2.4–7.0)

## 2011-06-01 NOTE — Patient Instructions (Signed)
Xray of the foot today- will update you with result Also labs for gout /infection  Use ice if foot is bothering you  Wear a roomy wide / sturdy shoe

## 2011-06-01 NOTE — Assessment & Plan Note (Signed)
2 week hx of L 4th toe swelling and pain (not much tenderness today)  Xray  Uric acid, cbc  No hx of injury but that is suspected  Pt does smoke , but nl pulses in foot  Will update Adv cold compress and supportive shoe until then

## 2011-06-01 NOTE — Progress Notes (Signed)
  Subjective:    Patient ID: Tammy Neal, female    DOB: 19-Jan-1951, 61 y.o.   MRN: [id number]  HPI Has a toe that swells and hurts - is the 4th toe 2 weeks ago - is staying about the same  Not really sore to the touch   No injury or stubbing  No new shoes   Does not feel like the nail is ingrown   Rest of foot ok   Smoking - has been working on quitting - cut down to 3-4 cig per day Close to quitting  Proud of this Stress is lower   Review of Systems     Objective:   Physical Exam  Constitutional: She appears well-developed and well-nourished. No distress.       overwt and well appearing   Eyes: Conjunctivae and EOM are normal. Pupils are equal, round, and reactive to light.  Cardiovascular: Normal rate and regular rhythm.   Pulmonary/Chest: Effort normal and breath sounds normal. No respiratory distress. She has no wheezes.       Diffusely distant bs   Musculoskeletal: Normal range of motion. She exhibits edema and tenderness.       L 4th toe is independently swollen (diffuse) with pink color but no warmth Nl rom  Very little tenderness to palp  Pt does favor other foot to walk   Skin: Skin is warm and dry. No rash noted. No pallor.  Psychiatric: She has a normal mood and affect.          Assessment & Plan:

## 2011-06-05 ENCOUNTER — Telehealth: Payer: Self-pay | Admitting: Family Medicine

## 2011-06-05 DIAGNOSIS — M79675 Pain in left toe(s): Secondary | ICD-10-CM

## 2011-06-05 NOTE — Telephone Encounter (Signed)
Message copied by Judy Pimple on Sun Jun 05, 2011  3:50 PM ------      Message from: Patience Musca      Created: Fri Jun 03, 2011  9:27 AM       Patient notified as instructed by telephone. Pt is agreeable to go to foot dr. In [place]. Pt will wait to hear from pt care coordinator and can be reached at [phone].

## 2011-06-05 NOTE — Telephone Encounter (Signed)
Ref to pod in [place] done

## 2011-10-26 ENCOUNTER — Telehealth: Payer: Self-pay | Admitting: Family Medicine

## 2011-10-26 DIAGNOSIS — E785 Hyperlipidemia, unspecified: Secondary | ICD-10-CM

## 2011-10-26 DIAGNOSIS — Z0000 Encounter for general adult medical examination without abnormal findings: Secondary | ICD-10-CM

## 2011-10-26 NOTE — Telephone Encounter (Signed)
Message copied by Judy Pimple on Wed Oct 26, 2011 10:35 PM ------      Message from: Baldomero Lamy      Created: Mon Oct 17, 2011 10:07 AM      Regarding: Cpx labs Thurs  9/5       Please order  future cpx labs for pt's upcomming lab appt.      Thanks      Rodney Booze

## 2011-10-27 ENCOUNTER — Other Ambulatory Visit (INDEPENDENT_AMBULATORY_CARE_PROVIDER_SITE_OTHER): Payer: BC Managed Care – PPO

## 2011-10-27 DIAGNOSIS — E785 Hyperlipidemia, unspecified: Secondary | ICD-10-CM

## 2011-10-27 DIAGNOSIS — Z0000 Encounter for general adult medical examination without abnormal findings: Secondary | ICD-10-CM

## 2011-10-27 LAB — CBC WITH DIFFERENTIAL/PLATELET
Basophils Relative: 0.5 % (ref 0.0–3.0)
Eosinophils Relative: 1.7 % (ref 0.0–5.0)
Lymphocytes Relative: 31.7 % (ref 12.0–46.0)
Neutrophils Relative %: 55.2 % (ref 43.0–77.0)
RBC: 4.79 Mil/uL (ref 3.87–5.11)
WBC: 8.7 10*3/uL (ref 4.5–10.5)

## 2011-10-27 LAB — COMPREHENSIVE METABOLIC PANEL
AST: 21 U/L (ref 0–37)
Albumin: 3.7 g/dL (ref 3.5–5.2)
BUN: 13 mg/dL (ref 6–23)
CO2: 27 mEq/L (ref 19–32)
Calcium: 9.2 mg/dL (ref 8.4–10.5)
Chloride: 108 mEq/L (ref 96–112)
Glucose, Bld: 89 mg/dL (ref 70–99)
Potassium: 4.6 mEq/L (ref 3.5–5.1)

## 2011-10-27 LAB — LIPID PANEL
Cholesterol: 242 mg/dL — ABNORMAL HIGH (ref 0–200)
Triglycerides: 315 mg/dL — ABNORMAL HIGH (ref 0.0–149.0)

## 2011-10-27 LAB — TSH: TSH: 3.16 u[IU]/mL (ref 0.35–5.50)

## 2011-11-02 ENCOUNTER — Ambulatory Visit (INDEPENDENT_AMBULATORY_CARE_PROVIDER_SITE_OTHER): Payer: BC Managed Care – PPO | Admitting: Family Medicine

## 2011-11-02 ENCOUNTER — Encounter: Payer: Self-pay | Admitting: Family Medicine

## 2011-11-02 VITALS — BP 130/74 | HR 72 | Temp 98.3°F | Ht 61.0 in | Wt 148.5 lb

## 2011-11-02 DIAGNOSIS — L304 Erythema intertrigo: Secondary | ICD-10-CM | POA: Insufficient documentation

## 2011-11-02 DIAGNOSIS — F17200 Nicotine dependence, unspecified, uncomplicated: Secondary | ICD-10-CM

## 2011-11-02 DIAGNOSIS — Z23 Encounter for immunization: Secondary | ICD-10-CM

## 2011-11-02 DIAGNOSIS — E785 Hyperlipidemia, unspecified: Secondary | ICD-10-CM

## 2011-11-02 DIAGNOSIS — Z0000 Encounter for general adult medical examination without abnormal findings: Secondary | ICD-10-CM

## 2011-11-02 DIAGNOSIS — B372 Candidiasis of skin and nail: Secondary | ICD-10-CM

## 2011-11-02 MED ORDER — KETOCONAZOLE 2 % EX CREA: TOPICAL_CREAM | Freq: Every day | CUTANEOUS | Status: AC

## 2011-11-02 NOTE — Patient Instructions (Addendum)
Flu shot today Use the ketoconazole cream under breasts for fungal rash- update me if no improvement  Please keep thinking about quitting smoking  Avoid red meat/ fried foods/ egg yolks/ fatty breakfast meats/ butter, cheese and high fat dairy/ and shellfish  -- it is very important to get your cholesterol down to decrease risk of heart attack and stroke  Try to get 1200-1500 mg of calcium per day with at least 1000 iu of vitamin D - for bone health  If you are concerned about memory- follow up with a family member who can give me some history, and I will do a memory test in the office

## 2011-11-02 NOTE — Assessment & Plan Note (Signed)
Disc in detail risks of smoking and possible outcomes including copd, vascular/ heart disease, cancer , respiratory and sinus infections  Pt voices understanding Pt not yet ready to quit  

## 2011-11-02 NOTE — Progress Notes (Signed)
Subjective:    Patient ID: Tammy Neal, female    DOB: 02-11-1951, 61 y.o.   MRN: [id number]  HPI Here for health maintenance exam and to review chronic medical problems   Is doing well overall Summer was ok   Her daughters are worried about her memory  She tends to forget things  She is not worried about , has not been lost or confused   Wt is stable with bmi of 28  Had a hysterectomy with 1 ovary removed in past  Did gyn exam last year-nl  No symptoms at all  No x of abn pap smears   mammo 3/13- she will get a reminder  Self exam-no lumps or changes   colonosc 1/10- was normal  No family hx of colon cancer She has never had a polyp  Flu shot today  dexa nl in 09 Ca and D -not regular  Smoking - has cut down to 1/2 ppd  Is really trying hard to cut down -- but not motivated to totally quit yet  No fractues  Psoriasis is the same Some redness under breasts with itching - ? If that is something else ? From sweating    Lipids Lab Results  Component Value Date   CHOL 242* 10/27/2011   CHOL 235* 04/30/2010   CHOL 233* 02/06/2008   Lab Results  Component Value Date   HDL 37.80* 10/27/2011   HDL 30.86* 04/30/2010   HDL 57.8 02/06/2008   No results found for this basename: LDLCALC   Lab Results  Component Value Date   TRIG 315.0* 10/27/2011   TRIG 318.0* 04/30/2010   TRIG 135 02/06/2008   Lab Results  Component Value Date   CHOLHDL 6 10/27/2011   CHOLHDL 7 04/30/2010   CHOLHDL 4.8 CALC 02/06/2008   Lab Results  Component Value Date   LDLDIRECT 148.4 10/27/2011   LDLDIRECT 136.5 04/30/2010   LDLDIRECT 161.4 02/06/2008   unable to take several statins in the past due to muscle pain  Does not want to try another med  Diet - no shellfish or butter, no red meat , but does eat fried foods  Not big portions   Cannot take asa   Patient Active Problem List  Diagnosis  . HYPERLIPIDEMIA  . TOBACCO ABUSE  . PSORIASIS  . NECK PAIN  . DEPRESSION  . Gynecological  examination  . Pain of toe of left foot  . Routine general medical examination at a health care facility   Past Medical History  Diagnosis Date  . Hyperlipidemia   . Depression   . Allergy     allergic rhinitis  . Psoriasis   . Neck pain, chronic     with fusion   Past Surgical History  Procedure Date  . Spine surgery 1977    cervical fusion Dr Channing Mutters  . Abdominal hysterectomy     endometriosis 1 ovary left  . Appendectomy    History  Substance Use Topics  . Smoking status: Current Every Day Smoker -- 1.0 packs/day  . Smokeless tobacco: Not on file  . Alcohol Use: No   Family History  Problem Relation Age of Onset  . Hypertension Mother   . Diabetes Mother   . Asthma Father   . COPD Father    Allergies  Allergen Reactions  . Aspirin     REACTION: GI upset  . Atorvastatin     REACTION: pain  . Simvastatin     REACTION: muscle pain  No current outpatient prescriptions on file prior to visit.      Review of Systems Review of Systems  Constitutional: Negative for fever, appetite change, fatigue and unexpected weight change.  Eyes: Negative for pain and visual disturbance.  Respiratory: Negative for cough and shortness of breath.   Cardiovascular: Negative for cp or palpitations    Gastrointestinal: Negative for nausea, diarrhea and constipation.  Genitourinary: Negative for urgency and frequency.  Skin: Negative for pallor and pos for rash - psoriasis, and also redness under breasts  Neurological: Negative for weakness, light-headedness, numbness and headaches.  Hematological: Negative for adenopathy. Does not bruise/bleed easily.  Psychiatric/Behavioral: Negative for dysphoric mood. The patient is not nervous/anxious.         Objective:   Physical Exam  Constitutional: She appears well-developed and well-nourished. No distress.       overwt and well appearing   HENT:  Head: Normocephalic and atraumatic.  Right Ear: External ear normal.  Left Ear:  External ear normal.  Nose: Nose normal.  Mouth/Throat: Oropharynx is clear and moist.  Eyes: Conjunctivae normal and EOM are normal. Pupils are equal, round, and reactive to light. No scleral icterus.  Neck: Normal range of motion. Neck supple. No JVD present. Carotid bruit is not present. No thyromegaly present.  Cardiovascular: Normal rate, regular rhythm, normal heart sounds and intact distal pulses.  Exam reveals no gallop.   Pulmonary/Chest: Effort normal and breath sounds normal. No respiratory distress. She has no wheezes. She exhibits no tenderness.  Abdominal: Soft. Bowel sounds are normal. She exhibits no distension, no abdominal bruit and no mass. There is no tenderness.  Genitourinary: No breast swelling, tenderness, discharge or bleeding.       Breast exam: No mass, nodules, thickening, tenderness, bulging, retraction, inflamation, nipple discharge or skin changes noted.  No axillary or clavicular LA.  Chaperoned exam.    Musculoskeletal: Normal range of motion. She exhibits no edema and no tenderness.  Lymphadenopathy:    She has no cervical adenopathy.  Neurological: She is alert. She has normal reflexes. No cranial nerve deficit. She exhibits normal muscle tone. Coordination normal.  Skin: Skin is warm and dry. Rash noted. There is erythema. No pallor.       Small plaque of psoriasis on back  Redness under breasts-moist/ well demarcated-resembling intertrigo candidal  Psychiatric: She has a normal mood and affect.          Assessment & Plan:

## 2011-11-02 NOTE — Assessment & Plan Note (Signed)
Pt intol of statins and does not want to try further med  Disc imp of chol control (esp in smoker) to avoid cardiovasc problems Disc goals for lipids and reasons to control them Rev labs with pt Rev low sat fat diet in detail  Given a handout on the topic about diet Needs to stop fried foods

## 2011-11-02 NOTE — Assessment & Plan Note (Signed)
Mild- under breasts  Disc keeping area clean and dry Ketoconazole px to use daily  Update if not starting to improve in a week or if worsening

## 2011-11-02 NOTE — Assessment & Plan Note (Signed)
Reviewed health habits including diet and exercise and skin cancer prevention Also reviewed health mt list, fam hx and immunizations  Rev wellness labs in detail Flu shot today Recommend smoking cessation/ wt loss Also needs to start ca and D for OP prev

## 2012-06-25 ENCOUNTER — Other Ambulatory Visit: Payer: Self-pay

## 2012-06-25 DIAGNOSIS — Z1231 Encounter for screening mammogram for malignant neoplasm of breast: Secondary | ICD-10-CM

## 2012-07-31 ENCOUNTER — Ambulatory Visit
Admission: RE | Admit: 2012-07-31 | Discharge: 2012-07-31 | Disposition: A | Discharge status: Home / Self Care | Payer: BC Managed Care – PPO | Source: Ambulatory Visit

## 2012-07-31 DIAGNOSIS — Z1231 Encounter for screening mammogram for malignant neoplasm of breast: Secondary | ICD-10-CM

## 2012-08-01 ENCOUNTER — Encounter: Payer: Self-pay | Admitting: *Deleted

## 2013-02-26 ENCOUNTER — Encounter: Payer: Self-pay | Admitting: Internal Medicine

## 2013-03-04 ENCOUNTER — Ambulatory Visit (INDEPENDENT_AMBULATORY_CARE_PROVIDER_SITE_OTHER): Payer: BC Managed Care – PPO | Admitting: Family Medicine

## 2013-03-04 ENCOUNTER — Encounter: Payer: Self-pay | Admitting: Family Medicine

## 2013-03-04 VITALS — BP 130/78 | HR 83 | Temp 99.6°F | Ht 61.0 in | Wt 140.0 lb

## 2013-03-04 DIAGNOSIS — R509 Fever, unspecified: Secondary | ICD-10-CM

## 2013-03-04 DIAGNOSIS — B9789 Other viral agents as the cause of diseases classified elsewhere: Principal | ICD-10-CM

## 2013-03-04 DIAGNOSIS — F17200 Nicotine dependence, unspecified, uncomplicated: Secondary | ICD-10-CM

## 2013-03-04 DIAGNOSIS — J069 Acute upper respiratory infection, unspecified: Secondary | ICD-10-CM | POA: Insufficient documentation

## 2013-03-04 DIAGNOSIS — R059 Cough, unspecified: Secondary | ICD-10-CM

## 2013-03-04 DIAGNOSIS — R05 Cough: Secondary | ICD-10-CM

## 2013-03-04 LAB — POCT INFLUENZA A/B
INFLUENZA A, POC: NEGATIVE
Influenza B, POC: NEGATIVE

## 2013-03-04 NOTE — Progress Notes (Signed)
Pre-visit discussion using our clinic review tool. No additional management support is needed unless otherwise documented below in the visit note.  

## 2013-03-04 NOTE — Assessment & Plan Note (Signed)
Disc in detail risks of smoking and possible outcomes including copd, vascular/ heart disease, cancer , respiratory and sinus infections  Pt voices understanding She is contemplating quitting and has cut down

## 2013-03-04 NOTE — Patient Instructions (Signed)
I think you have a head and chest cold that is getting better  Drink lots of fluids and get extra rest  Try mucinex DM for cough if needed  If worse or facial pain - or wheezing - let me know Update if not starting to improve in a week or if worsening    Think about quitting smoking - keep working on it   Smoking Cessation, Tips for Success If you are ready to quit smoking, congratulations! You have chosen to help yourself be healthier. Cigarettes bring nicotine, tar, carbon monoxide, and other irritants into your body. Your lungs, heart, and blood vessels will be able to work better without these poisons. There are many different ways to quit smoking. Nicotine gum, nicotine patches, a nicotine inhaler, or nicotine nasal spray can help with physical craving. Hypnosis, support groups, and medicines help break the habit of smoking. WHAT THINGS CAN I DO TO MAKE QUITTING EASIER?  Here are some tips to help you quit for good:  Pick a date when you will quit smoking completely. Tell all of your friends and family about your plan to quit on that date.  Do not try to slowly cut down on the number of cigarettes you are smoking. Pick a quit date and quit smoking completely starting on that day.  Throw away all cigarettes.   Clean and remove all ashtrays from your home, work, and car.   On a card, write down your reasons for quitting. Carry the card with you and read it when you get the urge to smoke.   Cleanse your body of nicotine. Drink enough water and fluids to keep your urine clear or pale yellow. Do this after quitting to flush the nicotine from your body.   Learn to predict your moods. Do not let a bad situation be your excuse to have a cigarette. Some situations in your life might tempt you into wanting a cigarette.   Never have "just one" cigarette. It leads to wanting another and another. Remind yourself of your decision to quit.   Change habits associated with smoking. If you  smoked while driving or when feeling stressed, try other activities to replace smoking. Stand up when drinking your coffee. Brush your teeth after eating. Sit in a different chair when you read the paper. Avoid alcohol while trying to quit, and try to drink fewer caffeinated beverages. Alcohol and caffeine may urge you to smoke.   Avoid foods and drinks that can trigger a desire to smoke, such as sugary or spicy foods and alcohol.   Ask people who smoke not to smoke around you.   Have something planned to do right after eating or having a cup of coffee. For example, plan to take a walk or exercise.   Try a relaxation exercise to calm you down and decrease your stress. Remember, you may be tense and nervous for the first 2 weeks after you quit, but this will pass.   Find new activities to keep your hands busy. Play with a pen, coin, or rubber band. Doodle or draw things on paper.   Brush your teeth right after eating. This will help cut down on the craving for the taste of tobacco after meals. You can also try mouthwash.   Use oral substitutes in place of cigarettes. Try using lemon drops, carrots, cinnamon sticks, or chewing gum. Keep them handy so they are available when you have the urge to smoke.   When you have the urge  to smoke, try deep breathing.   Designate your home as a nonsmoking area.   If you are a heavy smoker, ask your health care provider about a prescription for nicotine chewing gum. It can ease your withdrawal from nicotine.   Reward yourself. Set aside the cigarette money you save and buy yourself something nice.   Look for support from others. Join a support group or smoking cessation program. Ask someone at home or at work to help you with your plan to quit smoking.   Always ask yourself, "Do I need this cigarette or is this just a reflex?" Tell yourself, "Today, I choose not to smoke," or "I do not want to smoke." You are reminding yourself of your  decision to quit.  Do not replace cigarette smoking with electronic cigarettes (commonly called e-cigarettes). The safety of e-cigarettes is unknown, and some may contain harmful chemicals.  If you relapse, do not give up! Plan ahead and think about what you will do the next time you get the urge to smoke.  HOW WILL I FEEL WHEN I QUIT SMOKING? You may have symptoms of withdrawal because your body is used to nicotine (the addictive substance in cigarettes). You may crave cigarettes, be irritable, feel very hungry, cough often, get headaches, or have difficulty concentrating. The withdrawal symptoms are only temporary. They are strongest when you first quit but will go away within 10 14 days. When withdrawal symptoms occur, stay in control. Think about your reasons for quitting. Remind yourself that these are signs that your body is healing and getting used to being without cigarettes. Remember that withdrawal symptoms are easier to treat than the major diseases that smoking can cause.  Even after the withdrawal is over, expect periodic urges to smoke. However, these cravings are generally short lived and will go away whether you smoke or not. Do not smoke!  WHAT RESOURCES ARE AVAILABLE TO HELP ME QUIT SMOKING? Your health care provider can direct you to community resources or hospitals for support, which may include:  Group support.  Education.  Hypnosis.  Therapy. Document Released: 11/06/2003 Document Revised: 11/28/2012 Document Reviewed: 07/26/2012 [facility] Patient Information 2014 [place], [place].

## 2013-03-04 NOTE — Assessment & Plan Note (Signed)
Reassuring exam and pt is improving  Disc symptomatic care - see instructions on AVS  Update if not starting to improve in a week or if worsening   Counseled on smoking cessation

## 2013-03-04 NOTE — Progress Notes (Signed)
Subjective:    Patient ID: Tammy Neal, female    DOB: 1950-03-11, 63 y.o.   MRN: [id number]  HPI Here with uri symptoms  Started last week -- and improving but not better  Still coughing   No chills or sweats  99.6 today  Nasal symptoms are improved  No facial pain  Nasal d/c is a little green   Cough- prod - a little bit of mucous (not a lot - white to grey)  No wheeze or sob    Cut way back on cigarettes to less than 1/2 ppd   Patient Active Problem List   Diagnosis Date Noted  . Viral URI with cough 03/04/2013  . Candidal intertrigo 11/02/2011  . Routine general medical examination at a health care facility 10/26/2011  . Pain of toe of left foot 06/01/2011  . Gynecological examination 07/05/2010  . DEPRESSION 05/03/2010  . NECK PAIN 06/10/2008  . HYPERLIPIDEMIA 02/26/2007  . TOBACCO ABUSE 02/26/2007  . PSORIASIS 02/26/2007   Past Medical History  Diagnosis Date  . Hyperlipidemia   . Depression   . Allergy     allergic rhinitis  . Psoriasis   . Neck pain, chronic     with fusion   Past Surgical History  Procedure Laterality Date  . Spine surgery  1977    cervical fusion Dr Carloyn Manner  . Abdominal hysterectomy      endometriosis 1 ovary left  . Appendectomy     History  Substance Use Topics  . Smoking status: Current Every Day Smoker -- 0.50 packs/day  . Smokeless tobacco: Not on file  . Alcohol Use: No   Family History  Problem Relation Age of Onset  . Hypertension Mother   . Diabetes Mother   . Asthma Father   . COPD Father    Allergies  Allergen Reactions  . Aspirin     REACTION: GI upset  . Atorvastatin     REACTION: pain  . Simvastatin     REACTION: muscle pain   No current outpatient prescriptions on file prior to visit.   No current facility-administered medications on file prior to visit.    Review of Systems Review of Systems  Constitutional: Negative for fever, appetite change,  and unexpected weight change.  ENt pos for  congestion and rhinorrhea and st/ neg for facial pain  Eyes: Negative for pain and visual disturbance.  Respiratory: Negative for wheeze and shortness of breath.   Cardiovascular: Negative for cp or palpitations    Gastrointestinal: Negative for nausea, diarrhea and constipation.  Genitourinary: Negative for urgency and frequency.  Skin: Negative for pallor or rash   Neurological: Negative for weakness, light-headedness, numbness and headaches.  Hematological: Negative for adenopathy. Does not bruise/bleed easily.  Psychiatric/Behavioral: Negative for dysphoric mood. The patient is not nervous/anxious.         Objective:   Physical Exam  Constitutional: She appears well-developed and well-nourished. No distress.  HENT:  Head: Normocephalic and atraumatic.  Right Ear: External ear normal.  Left Ear: External ear normal.  Mouth/Throat: Oropharynx is clear and moist. No oropharyngeal exudate.  Nares are injected and congested   No sinus tenderness   Eyes: Conjunctivae and EOM are normal. Pupils are equal, round, and reactive to light. Right eye exhibits no discharge. Left eye exhibits no discharge. No scleral icterus.  Neck: Normal range of motion. Neck supple.  Cardiovascular: Normal rate and regular rhythm.   Pulmonary/Chest: Effort normal and breath sounds normal. No respiratory  distress. She has no wheezes. She has no rales.  Lymphadenopathy:    She has no cervical adenopathy.  Neurological: She is alert.  Skin: Skin is warm and dry. No rash noted.  Psychiatric: She has a normal mood and affect.          Assessment & Plan:

## 2013-03-23 ENCOUNTER — Telehealth: Payer: Self-pay | Admitting: Family Medicine

## 2013-03-23 NOTE — Telephone Encounter (Signed)
Relevant patient education mailed to patient.  

## 2013-04-23 ENCOUNTER — Encounter: Payer: Self-pay | Admitting: Family Medicine

## 2013-04-23 ENCOUNTER — Ambulatory Visit (INDEPENDENT_AMBULATORY_CARE_PROVIDER_SITE_OTHER): Payer: BC Managed Care – PPO | Admitting: Family Medicine

## 2013-04-23 ENCOUNTER — Telehealth: Payer: Self-pay | Admitting: Family Medicine

## 2013-04-23 VITALS — BP 152/90 | HR 88 | Temp 98.0°F | Wt 138.8 lb

## 2013-04-23 DIAGNOSIS — R42 Dizziness and giddiness: Secondary | ICD-10-CM

## 2013-04-23 MED ORDER — FLUTICASONE PROPIONATE 50 MCG/ACT NA SUSP: 2.0000 | Freq: Every day | NASAL | Status: DC

## 2013-04-23 NOTE — Patient Instructions (Addendum)
Let's start monitoring blood pressure at home because today it's elevated - let me know if consistently >140/90. I wonder if this dizzy episode yesterday was due to sinus congestion you currently have. I think you have a viral sinus infection/congestion. Treat with nasal saline (over the counter) and nasal steroid (sent to pharmacy). Let us know if dizziness returns or new symptoms develop.

## 2013-04-23 NOTE — Progress Notes (Signed)
BP 152/90  Pulse 88  Temp(Src) 98 F (36.7 C) (Oral)  Wt 138 lb 12.8 oz (62.959 kg)   CC: dizziness  Subjective:    Patient ID: Tammy Neal, female    DOB: [birth year], 62 y.o.   MRN: [id number]  HPI: Tammy Neal is a 63 y.o. female presenting on 04/23/2013 with Dizziness   Pleasant 63 yo patient of Dr. Marliss Coots presents today with complaint of an episode of dizziness yesterday.  Daughter was with her, asked her to lay down.  This slowly resolved over 1-2 minutes.  Has never had dizzy episode in the past.  Described as lightheadedness.  No vertigo or presyncope.  Increased head congestion and hoarseness over last 1 week.  This is getting better on its own.   No fevers/chills, headaches, vision changes, confusion, slurred speech.  No paresthesias or numbness or weakness of the body with this episode.  H/o neck surgeries x2, with residual chronic neck pain.  Slowly cutting back to 1/2 ppd.   Mother with h/o stroke 60s.  Pt with aspirin intolerance - GI upset.  BP Readings from Last 3 Encounters:  04/23/13 152/90  03/04/13 130/78  11/02/11 130/74    Relevant past medical, surgical, family and social history reviewed and updated as indicated.  Allergies and medications reviewed and updated. No current outpatient prescriptions on file prior to visit.   No current facility-administered medications on file prior to visit.    Review of Systems Per HPI unless specifically indicated above    Objective:    BP 152/90  Pulse 88  Temp(Src) 98 F (36.7 C) (Oral)  Wt 138 lb 12.8 oz (62.959 kg)  Physical Exam  Nursing note and vitals reviewed. Constitutional: She is oriented to person, place, and time. She appears well-developed and well-nourished. No distress.  HENT:  Head: Normocephalic and atraumatic.  Right Ear: Hearing, tympanic membrane, external ear and ear canal normal.  Left Ear: Hearing, tympanic membrane, external ear and ear canal normal.  Nose: No mucosal  edema or rhinorrhea. Right sinus exhibits no maxillary sinus tenderness and no frontal sinus tenderness. Left sinus exhibits no maxillary sinus tenderness and no frontal sinus tenderness.  Mouth/Throat: Uvula is midline, oropharynx is clear and moist and mucous membranes are normal. No oropharyngeal exudate, posterior oropharyngeal edema, posterior oropharyngeal erythema or tonsillar abscesses.  Mild congestion behind L TM  Eyes: Conjunctivae and EOM are normal. Pupils are equal, round, and reactive to light. No scleral icterus.  Neck: Normal range of motion. Neck supple. Carotid bruit is not present.  Cardiovascular: Normal rate, regular rhythm, normal heart sounds and intact distal pulses.   No murmur heard. Pulmonary/Chest: Effort normal and breath sounds normal. No respiratory distress. She has no wheezes. She has no rales.  Lymphadenopathy:    She has no cervical adenopathy.  Neurological: She is alert and oriented to person, place, and time. She has normal strength. No cranial nerve deficit or sensory deficit. She displays a negative Romberg sign. Coordination normal.  CN 2-12 intact Station and gait intact Normal FTN  Skin: Skin is warm and dry. No rash noted.    Orthostatic vital signs negative today in office.    Assessment & Plan:   Problem List Items Addressed This Visit   Dizziness - Primary     ?related to current sinus inflammation/congestion.  Anticipate viral vs noninfectious. Treat with nasal saline , flonase, and rest and hydration. Update if sxs recur or persist for further evaluation. Nonfocal neurological  exam today, negative orthostatic vital signs today. For elevated BP today - pt will monitor and notify me if persistently elevated ( granddaughter is CNA and can check bp at home)        Follow up plan: Return if symptoms worsen or fail to improve.

## 2013-04-23 NOTE — Assessment & Plan Note (Addendum)
?  related to current sinus inflammation/congestion.  Anticipate viral vs noninfectious. Treat with nasal saline , flonase, and rest and hydration. Update if sxs recur or persist for further evaluation. Nonfocal neurological exam today, negative orthostatic vital signs today. For elevated BP today - pt will monitor and notify me if persistently elevated ( granddaughter is CNA and can check bp at home)

## 2013-04-23 NOTE — Progress Notes (Signed)
Pre visit review using our clinic review tool, if applicable. No additional management support is needed unless otherwise documented below in the visit note. 

## 2013-04-23 NOTE — Telephone Encounter (Signed)
Relevant patient education mailed to patient.  

## 2013-07-30 ENCOUNTER — Encounter: Payer: Self-pay | Admitting: Internal Medicine

## 2013-08-06 ENCOUNTER — Encounter: Payer: Self-pay | Admitting: Internal Medicine

## 2013-08-08 ENCOUNTER — Other Ambulatory Visit: Payer: Self-pay

## 2013-08-08 DIAGNOSIS — Z1231 Encounter for screening mammogram for malignant neoplasm of breast: Secondary | ICD-10-CM

## 2013-08-15 ENCOUNTER — Encounter (INDEPENDENT_AMBULATORY_CARE_PROVIDER_SITE_OTHER): Payer: Self-pay

## 2013-08-15 ENCOUNTER — Ambulatory Visit
Admission: RE | Admit: 2013-08-15 | Discharge: 2013-08-15 | Disposition: A | Discharge status: Home / Self Care | Payer: BC Managed Care – PPO | Source: Ambulatory Visit

## 2013-08-15 DIAGNOSIS — Z1231 Encounter for screening mammogram for malignant neoplasm of breast: Secondary | ICD-10-CM

## 2013-08-16 ENCOUNTER — Encounter: Payer: Self-pay | Admitting: *Deleted

## 2013-09-10 ENCOUNTER — Ambulatory Visit (INDEPENDENT_AMBULATORY_CARE_PROVIDER_SITE_OTHER): Payer: BC Managed Care – PPO | Admitting: Internal Medicine

## 2013-09-10 ENCOUNTER — Encounter: Payer: Self-pay | Admitting: Internal Medicine

## 2013-09-10 VITALS — BP 112/64 | HR 74 | Temp 98.0°F | Wt 138.0 lb

## 2013-09-10 DIAGNOSIS — M1990 Unspecified osteoarthritis, unspecified site: Secondary | ICD-10-CM

## 2013-09-10 DIAGNOSIS — M7989 Other specified soft tissue disorders: Secondary | ICD-10-CM

## 2013-09-10 DIAGNOSIS — M129 Arthropathy, unspecified: Secondary | ICD-10-CM

## 2013-09-10 NOTE — Progress Notes (Signed)
Subjective:    Patient ID: Tammy Neal, female    DOB: 10-Sep-1950, 63 y.o.   MRN: [id number]  HPI  Pt presents to the clinic today with c/o toe swelling. This started 2-3 days ago. She reports the 2nd toe on her left foot is affected. The toe is red but is not painful. She denies any specific injury to the area. She reports that she has had something like this in the past. She was sent to a specialist where she had an injection in it which did help. She has not tried anything OTC.  Review of Systems      Past Medical History  Diagnosis Date  . Hyperlipidemia   . Depression   . Allergy     allergic rhinitis  . Psoriasis   . Neck pain, chronic     with fusion    No current outpatient prescriptions on file.   No current facility-administered medications for this visit.    Allergies  Allergen Reactions  . Aspirin     REACTION: GI upset  . Atorvastatin     REACTION: pain  . Simvastatin     REACTION: muscle pain    Family History  Problem Relation Age of Onset  . Hypertension Mother   . Diabetes Mother   . Asthma Father   . COPD Father   . Stroke Mother 47    History   Social History  . Marital Status: Single    Spouse Name: N/A    Number of Children: N/A  . Years of Education: N/A   Occupational History  . Not on file.   Social History Main Topics  . Smoking status: Current Every Day Smoker -- 0.50 packs/day  . Smokeless tobacco: Not on file  . Alcohol Use: No  . Drug Use: No  . Sexual Activity: Not on file   Other Topics Concern  . Not on file   Social History Narrative  . No narrative on file     Constitutional: Denies fever, malaise, fatigue, headache or abrupt weight changes.  Musculoskeletal: Pt reports toe swelling. Denies decrease in range of motion, difficulty with gait, muscle pain.  Skin: Pt reports redness. Denies rashes, lesions or ulcercations.    No other specific complaints in a complete review of systems (except as listed  in HPI above).  Objective:   Physical Exam  BP 112/64  Pulse 74  Temp(Src) 98 F (36.7 C) (Oral)  Wt 138 lb (62.596 kg)  SpO2 98% Wt Readings from Last 3 Encounters:  09/10/13 138 lb (62.596 kg)  04/23/13 138 lb 12.8 oz (62.959 kg)  03/04/13 140 lb (63.504 kg)    General: Appears her stated age, well developed, well nourished in NAD. Skin: Warm, dry and intact. No rashes, lesions or ulcerations noted. Redness noted of 2nd toe left foot but no warmth. Cardiovascular: Normal rate and rhythm. S1,S2 noted.  No murmur, rubs or gallops noted. No JVD or BLE edema. No carotid bruits noted. Pulmonary/Chest: Normal effort and positive vesicular breath sounds. No respiratory distress. No wheezes, rales or ronchi noted.  Musculoskeletal: . Normal flexion and extension of the toe. No pain with palpation. No difficulty with gait.  BMET    Component Value Date/Time   NA 142 10/27/2011 0817   K 4.6 10/27/2011 0817   CL 108 10/27/2011 0817   CO2 27 10/27/2011 0817   GLUCOSE 89 10/27/2011 0817   BUN 13 10/27/2011 0817   CREATININE 0.8 10/27/2011 0817  CALCIUM 9.2 10/27/2011 0817   GFRNONAA >60 08/18/2008 1114   GFRAA  Value: >60        The eGFR has been calculated using the MDRD equation. This calculation has not been validated in all clinical situations. eGFR's persistently <60 mL/min signify possible Chronic Kidney Disease. 08/18/2008 1114    Lipid Panel     Component Value Date/Time   CHOL 242* 10/27/2011 0817   TRIG 315.0* 10/27/2011 0817   HDL 37.80* 10/27/2011 0817   CHOLHDL 6 10/27/2011 0817   VLDL 63.0* 10/27/2011 0817    CBC    Component Value Date/Time   WBC 8.7 10/27/2011 0817   RBC 4.79 10/27/2011 0817   HGB 15.3* 10/27/2011 0817   HCT 45.3 10/27/2011 0817   PLT 243.0 10/27/2011 0817   MCV 94.6 10/27/2011 0817   MCHC 33.7 10/27/2011 0817   RDW 13.4 10/27/2011 0817   LYMPHSABS 2.8 10/27/2011 0817   MONOABS 1.0 10/27/2011 0817   EOSABS 0.1 10/27/2011 0817   BASOSABS 0.0 10/27/2011 0817    Hgb A1C No results  found for this basename: HGBA1C        Assessment & Plan:  2nd tow swelling, left foot, likely arthritis flare:  She is already a patient at [facility], Dr. Milinda Pointer She will call and request an appt I do not think this is gout   RTC as needed or if symptoms persist or worsen

## 2013-09-10 NOTE — Progress Notes (Signed)
Pre visit review using our clinic review tool, if applicable. No additional management support is needed unless otherwise documented below in the visit note. 

## 2013-09-10 NOTE — Patient Instructions (Addendum)
Arthritis, Nonspecific °Arthritis is inflammation of a joint. This usually means pain, redness, warmth or swelling are present. One or more joints may be involved. There are a number of types of arthritis. Your caregiver may not be able to tell what type of arthritis you have right away. °CAUSES  °The most common cause of arthritis is the wear and tear on the joint (osteoarthritis). This causes damage to the cartilage, which can break down over time. The knees, hips, back and neck are most often affected by this type of arthritis. °Other types of arthritis and common causes of joint pain include: °· Sprains and other injuries near the joint. Sometimes minor sprains and injuries cause pain and swelling that develop hours later. °· Rheumatoid arthritis. This affects hands, feet and knees. It usually affects both sides of your body at the same time. It is often associated with chronic ailments, fever, weight loss and general weakness. °· Crystal arthritis. Gout and pseudo gout can cause occasional acute severe pain, redness and swelling in the foot, ankle, or knee. °· Infectious arthritis. Bacteria can get into a joint through a break in overlying skin. This can cause infection of the joint. Bacteria and viruses can also spread through the blood and affect your joints. °· Drug, infectious and allergy reactions. Sometimes joints can become mildly painful and slightly swollen with these types of illnesses. °SYMPTOMS  °· Pain is the main symptom. °· Your joint or joints can also be red, swollen and warm or hot to the touch. °· You may have a fever with certain types of arthritis, or even feel overall ill. °· The joint with arthritis will hurt with movement. Stiffness is present with some types of arthritis. °DIAGNOSIS  °Your caregiver will suspect arthritis based on your description of your symptoms and on your exam. Testing may be needed to find the type of arthritis: °· Blood and sometimes urine tests. °· X-ray tests  and sometimes CT or MRI scans. °· Removal of fluid from the joint (arthrocentesis) is done to check for bacteria, crystals or other causes. Your caregiver (or a specialist) will numb the area over the joint with a local anesthetic, and use a needle to remove joint fluid for examination. This procedure is only minimally uncomfortable. °· Even with these tests, your caregiver may not be able to tell what kind of arthritis you have. Consultation with a specialist (rheumatologist) may be helpful. °TREATMENT  °Your caregiver will discuss with you treatment specific to your type of arthritis. If the specific type cannot be determined, then the following general recommendations may apply. °Treatment of severe joint pain includes: °· Rest. °· Elevation. °· Anti-inflammatory medication (for example, ibuprofen) may be prescribed. Avoiding activities that cause increased pain. °· Only take over-the-counter or prescription medicines for pain and discomfort as recommended by your caregiver. °· Cold packs over an inflamed joint may be used for 10 to 15 minutes every hour. Hot packs sometimes feel better, but do not use overnight. Do not use hot packs if you are diabetic without your caregiver's permission. °· A cortisone shot into arthritic joints may help reduce pain and swelling. °· Any acute arthritis that gets worse over the next 1 to 2 days needs to be looked at to be sure there is no joint infection. °Long-term arthritis treatment involves modifying activities and lifestyle to reduce joint stress jarring. This can include weight loss. Also, exercise is needed to nourish the joint cartilage and remove waste. This helps keep the muscles   around the joint strong. °HOME CARE INSTRUCTIONS  °· Do not take aspirin to relieve pain if gout is suspected. This elevates uric acid levels. °· Only take over-the-counter or prescription medicines for pain, discomfort or fever as directed by your caregiver. °· Rest the joint as much as  possible. °· If your joint is swollen, keep it elevated. °· Use crutches if the painful joint is in your leg. °· Drinking plenty of fluids may help for certain types of arthritis. °· Follow your caregiver's dietary instructions. °· Try low-impact exercise such as: °¨ Swimming. °¨ Water aerobics. °¨ Biking. °¨ Walking. °· Morning stiffness is often relieved by a warm shower. °· Put your joints through regular range-of-motion. °SEEK MEDICAL CARE IF:  °· You do not feel better in 24 hours or are getting worse. °· You have side effects to medications, or are not getting better with treatment. °SEEK IMMEDIATE MEDICAL CARE IF:  °· You have a fever. °· You develop severe joint pain, swelling or redness. °· Many joints are involved and become painful and swollen. °· There is severe back pain and/or leg weakness. °· You have loss of bowel or bladder control. °Document Released: 03/17/2004 Document Revised: 05/02/2011 Document Reviewed: 04/02/2008 °ExitCare® Patient Information ©2015 [employer]. This information is not intended to replace advice given to you by your health care provider. Make sure you discuss any questions you have with your health care provider. ° °

## 2013-09-13 ENCOUNTER — Ambulatory Visit (AMBULATORY_SURGERY_CENTER): Payer: Self-pay | Admitting: *Deleted

## 2013-09-13 ENCOUNTER — Ambulatory Visit: Payer: Self-pay | Admitting: Podiatry

## 2013-09-13 VITALS — Ht 61.0 in | Wt 141.0 lb

## 2013-09-13 DIAGNOSIS — Z86010 Personal history of colonic polyps: Secondary | ICD-10-CM

## 2013-09-13 MED ORDER — MOVIPREP 100 G PO SOLR: ORAL | Status: DC

## 2013-09-13 NOTE — Progress Notes (Signed)
No egg or soy allergy  No anesthesia or intubation problems per pt.  Pt has had 2 cervical fusions but states she has no difficulty in moving her neck  No diet medications taken  Registered in [facility]

## 2013-09-19 ENCOUNTER — Encounter: Payer: Self-pay | Admitting: Podiatry

## 2013-09-19 ENCOUNTER — Ambulatory Visit (INDEPENDENT_AMBULATORY_CARE_PROVIDER_SITE_OTHER): Payer: BC Managed Care – PPO | Admitting: Podiatry

## 2013-09-19 ENCOUNTER — Ambulatory Visit (INDEPENDENT_AMBULATORY_CARE_PROVIDER_SITE_OTHER): Payer: BC Managed Care – PPO

## 2013-09-19 VITALS — BP 149/82 | HR 69 | Resp 16 | Ht 62.0 in | Wt 150.0 lb

## 2013-09-19 DIAGNOSIS — M7750 Other enthesopathy of unspecified foot: Secondary | ICD-10-CM

## 2013-09-19 DIAGNOSIS — M2042 Other hammer toe(s) (acquired), left foot: Secondary | ICD-10-CM

## 2013-09-19 DIAGNOSIS — M2040 Other hammer toe(s) (acquired), unspecified foot: Secondary | ICD-10-CM

## 2013-09-19 DIAGNOSIS — M7752 Other enthesopathy of left foot: Secondary | ICD-10-CM

## 2013-09-19 NOTE — Progress Notes (Signed)
Left #2 toe red and swollen , its been about a month or more. She states it has happened to the second digit of the right foot before and she was given cortisone injection. She denies trauma fever chills nausea vomiting muscle aches or pains rashes and tick bites.  Objective: Vital signs are stable she is alert and oriented x3. Pulses are strongly palpable. Red swollen toe overlying the DIPJ second digit left foot. This does not appear to be states cellulitic nor does it feel warm to the touch. No signs of infection. Radiographic evaluation demonstrates no signs of infection simply arthritis.  Assessment: Probable capsulitis arthritis second digit left foot.  Plan: We sent her for an arthritic profile and I injected periarticular dexamethasone and local anesthetic after sterile Betadine skin prep. I will followup with her in approximately 2 weeks

## 2013-09-20 ENCOUNTER — Telehealth: Payer: Self-pay | Admitting: *Deleted

## 2013-09-20 LAB — ANA: Anti Nuclear Antibody(ANA): NEGATIVE

## 2013-09-20 LAB — C-REACTIVE PROTEIN: CRP: 2.8 mg/L (ref 0.0–4.9)

## 2013-09-20 LAB — RHEUMATOID FACTOR

## 2013-09-20 LAB — URIC ACID: Uric Acid: 4.7 mg/dL (ref 2.5–7.1)

## 2013-09-20 LAB — SEDIMENTATION RATE: SED RATE: 3 mm/h (ref 0–40)

## 2013-09-20 NOTE — Telephone Encounter (Signed)
Informed patient of blood work results.

## 2013-09-20 NOTE — Telephone Encounter (Signed)
Message copied by Dierdre Searles on Fri Sep 20, 2013  3:59 PM ------      Message from: HYATT, MAX T      Created: Fri Sep 20, 2013 12:54 PM       Blood work looks normal and will follow up with her at reg scheduled appointment. ------

## 2013-09-27 ENCOUNTER — Ambulatory Visit (AMBULATORY_SURGERY_CENTER): Payer: BC Managed Care – PPO | Admitting: Internal Medicine

## 2013-09-27 ENCOUNTER — Encounter: Payer: Self-pay | Admitting: Internal Medicine

## 2013-09-27 VITALS — BP 116/62 | HR 64 | Temp 96.5°F | Resp 24 | Ht 61.0 in | Wt 141.0 lb

## 2013-09-27 DIAGNOSIS — D126 Benign neoplasm of colon, unspecified: Secondary | ICD-10-CM

## 2013-09-27 DIAGNOSIS — Z86010 Personal history of colonic polyps: Secondary | ICD-10-CM

## 2013-09-27 DIAGNOSIS — D123 Benign neoplasm of transverse colon: Secondary | ICD-10-CM

## 2013-09-27 MED ORDER — SODIUM CHLORIDE 0.9 % IV SOLN: 500.0000 mL | INTRAVENOUS | Status: DC

## 2013-09-27 NOTE — Patient Instructions (Signed)

## 2013-09-27 NOTE — Progress Notes (Signed)
Report to PACU, RN, vss, BBS= Clear.  

## 2013-09-27 NOTE — Progress Notes (Signed)
YOU HAD AN ENDOSCOPIC PROCEDURE TODAY AT THE [facility]: Refer to the procedure report that was given to you for any specific questions about what was found during the examination.  If the procedure report does not answer your questions, please call your gastroenterologist to clarify.  If you requested that your care partner not be given the details of your procedure findings, then the procedure report has been included in a sealed envelope for you to review at your convenience later.  YOU SHOULD EXPECT: Some feelings of bloating in the abdomen. Passage of more gas than usual.  Walking can help get rid of the air that was put into your GI tract during the procedure and reduce the bloating. If you had a lower endoscopy (such as a colonoscopy or flexible sigmoidoscopy) you may notice spotting of blood in your stool or on the toilet paper. If you underwent a bowel prep for your procedure, then you may not have a normal bowel movement for a few days.  DIET: Your first meal following the procedure should be a light meal and then it is ok to progress to your normal diet.  A half-sandwich or bowl of soup is an example of a good first meal.  Heavy or fried foods are harder to digest and may make you feel nauseous or bloated.  Likewise meals heavy in dairy and vegetables can cause extra gas to form and this can also increase the bloating.  Drink plenty of fluids but you should avoid alcoholic beverages for 24 hours.  ACTIVITY: Your care partner should take you home directly after the procedure.  You should plan to take it easy, moving slowly for the rest of the day.  You can resume normal activity the day after the procedure however you should NOT DRIVE or use heavy machinery for 24 hours (because of the sedation medicines used during the test).    SYMPTOMS TO REPORT IMMEDIATELY: A gastroenterologist can be reached at any hour.  During normal business hours, 8:30 AM to 5:00 PM Monday through Friday,  call [phone].  After hours and on weekends, please call the GI answering service at [phone] who will take a message and have the physician on call contact you.   Following lower endoscopy (colonoscopy or flexible sigmoidoscopy):  Excessive amounts of blood in the stool  Significant tenderness or worsening of abdominal pains  Swelling of the abdomen that is new, acute  Fever of 100F or higher  FOLLOW UP: If any biopsies were taken you will be contacted by phone or by letter within the next 1-3 weeks.  Call your gastroenterologist if you have not heard about the biopsies in 3 weeks.  Our staff will call the home number listed on your records the next business day following your procedure to check on you and address any questions or concerns that you may have at that time regarding the information given to you following your procedure. This is a courtesy call and so if there is no answer at the home number and we have not heard from you through the emergency physician on call, we will assume that you have returned to your regular daily activities without incident.  SIGNATURES/CONFIDENTIALITY: You and/or your care partner have signed paperwork which will be entered into your electronic medical record.  These signatures attest to the fact that that the information above on your After Visit Summary has been reviewed and is understood.  Full responsibility of the confidentiality of this   discharge information lies with you and/or your care-partner.    Handouts were given to your care partner on polyps, diverticulosis, a high fiber diet with liberal fluid intake. You may resume your current medications today. Await biopsy results. Please call if any questions or concerns.

## 2013-09-27 NOTE — Op Note (Signed)
[facility]  [address] [place], [id number]   COLONOSCOPY PROCEDURE REPORT  PATIENT: Tammy Neal, Tammy Neal  MR#: [id number] BIRTHDATE: March 03, 1950 , 52  yrs. old GENDER: Female ENDOSCOPIST: Eustace Quail, MD REFERRED GN:[id number] Program Recall PROCEDURE DATE:  09/27/2013 PROCEDURE:   Colonoscopy with snare polypectomy x 1 First Screening Colonoscopy - Avg.  risk and is 50 yrs.  old or older - No.  Prior Negative Screening - Now for repeat screening. N/A  History of Adenoma - Now for follow-up colonoscopy & has been > or = to 3 yrs.  Yes hx of adenoma.  Has been 3 or more years since last colonoscopy.  Polyps Removed Today? Yes. ASA CLASS:   Class II INDICATIONS:Patient's personal history of adenomatous colon polyps. Index exam 2004 negative; January 2010 small adenomas. MEDICATIONS: MAC sedation, administered by CRNA and propofol (Diprivan) 230mg  IV  DESCRIPTION OF PROCEDURE:   After the risks benefits and alternatives of the procedure were thoroughly explained, informed consent was obtained.  A digital rectal exam revealed no abnormalities of the rectum.   The [id number] PEDIATRIC endoscope was introduced through the anus and advanced to the cecum, which was identified by both the appendix and ileocecal valve. No adverse events experienced.   The quality of the prep was excellent, using MoviPrep  The instrument was then slowly withdrawn as the colon was fully examined.  COLON FINDINGS: A diminutive polyp was found in the transverse colon.  A polypectomy was performed with a cold snare.  The resection was complete and the polyp tissue was completely retrieved.   Severe diverticulosis was noted throughout the colon with marked stenosis at the rectosigmoid junction.   The colon mucosa was otherwise normal.  Retroflexed views revealed no abnormalities. The time to cecum=9 min 06 secs.  Withdrawal time=7 minutes 26 seconds.  The scope was withdrawn and  the procedure completed. COMPLICATIONS: There were no complications.  ENDOSCOPIC IMPRESSION: 1.   Diminutive polyp was found in the transverse colon; polypectomy was performed with a cold snare 2.   Severe diverticulosis was noted throughout the colon with marked stenosis at the rectosigmoid junction 3.   The colon mucosa was otherwise normal  RECOMMENDATIONS: 1. Repeat colonoscopy in 5 years if polyp adenomatous; otherwise 10 years (PEDIATRIC SCOPE)   eSigned:  Eustace Quail, MD 09/27/2013 12:11 PM   cc: Abner Greenspan, MD and The Patient

## 2013-09-27 NOTE — Progress Notes (Signed)
Called to room to assist during endoscopic procedure.  Patient ID and intended procedure confirmed with present staff. Received instructions for my participation in the procedure from the performing physician.  

## 2013-09-30 ENCOUNTER — Telehealth: Payer: Self-pay | Admitting: *Deleted

## 2013-09-30 NOTE — Telephone Encounter (Signed)
  Follow up Call-  Call back number 09/27/2013  Post procedure Call Back phone  # [phone]  Permission to leave phone message Yes     No answer at # given.  Left message on voicemail.

## 2013-10-08 ENCOUNTER — Encounter: Payer: Self-pay | Admitting: Internal Medicine

## 2013-10-14 ENCOUNTER — Ambulatory Visit (INDEPENDENT_AMBULATORY_CARE_PROVIDER_SITE_OTHER): Payer: BC Managed Care – PPO | Admitting: Podiatry

## 2013-10-14 VITALS — BP 132/69 | HR 84 | Resp 16

## 2013-10-14 DIAGNOSIS — M7752 Other enthesopathy of left foot: Secondary | ICD-10-CM

## 2013-10-14 DIAGNOSIS — M7750 Other enthesopathy of unspecified foot: Secondary | ICD-10-CM

## 2013-10-14 MED ORDER — DOXYCYCLINE HYCLATE 100 MG PO TABS: 100.0000 mg | ORAL_TABLET | Freq: Two times a day (BID) | ORAL | Status: DC

## 2013-10-14 NOTE — Progress Notes (Signed)
She presents today for followup of her second digit left foot. She states that after the injection he remains swollen and red. He really doesn't hurt her that much.  Objective: Pulses are palpable left foot. No change in physical exam second digit left foot. Blood work/arthritic profile was normal.  Assessment: Erythematous edematous DIPJ second digit left foot.  Plan: Started her on doxycycline 100 mg twice daily for possible bug bite.

## 2013-11-04 ENCOUNTER — Encounter: Payer: Self-pay | Admitting: Internal Medicine

## 2013-11-04 ENCOUNTER — Ambulatory Visit: Payer: BC Managed Care – PPO | Admitting: Podiatry

## 2013-11-14 ENCOUNTER — Encounter: Payer: Self-pay | Admitting: Podiatry

## 2013-11-14 ENCOUNTER — Ambulatory Visit (INDEPENDENT_AMBULATORY_CARE_PROVIDER_SITE_OTHER): Payer: BC Managed Care – PPO | Admitting: Podiatry

## 2013-11-14 VITALS — BP 140/86 | HR 66 | Resp 16

## 2013-11-14 DIAGNOSIS — M2042 Other hammer toe(s) (acquired), left foot: Secondary | ICD-10-CM

## 2013-11-14 DIAGNOSIS — M2040 Other hammer toe(s) (acquired), unspecified foot: Secondary | ICD-10-CM

## 2013-11-14 DIAGNOSIS — M19079 Primary osteoarthritis, unspecified ankle and foot: Secondary | ICD-10-CM

## 2013-11-15 NOTE — Progress Notes (Signed)
She presents today for followup of a hammertoe and a edematous red toe second digit of the left foot. She states that just her antibiotics until no longer hurts but still remains red.  Objective: Vital signs are stable she is alert and oriented x3 the distal aspect of the second digit left foot does demonstrate mild erythema and typical anatomical abnormality of the joint.  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  Plan: Followup with me as needed.

## 2014-11-25 ENCOUNTER — Ambulatory Visit (INDEPENDENT_AMBULATORY_CARE_PROVIDER_SITE_OTHER)
Admission: RE | Admit: 2014-11-25 | Discharge: 2014-11-25 | Disposition: A | Discharge status: Home / Self Care | Payer: BLUE CROSS/BLUE SHIELD | Source: Ambulatory Visit | Attending: Family Medicine | Admitting: Family Medicine

## 2014-11-25 ENCOUNTER — Ambulatory Visit (INDEPENDENT_AMBULATORY_CARE_PROVIDER_SITE_OTHER): Payer: BLUE CROSS/BLUE SHIELD | Admitting: Family Medicine

## 2014-11-25 ENCOUNTER — Ambulatory Visit
Admission: RE | Admit: 2014-11-25 | Discharge: 2014-11-25 | Disposition: A | Discharge status: Home / Self Care | Payer: BLUE CROSS/BLUE SHIELD | Source: Ambulatory Visit | Attending: Family Medicine | Admitting: Family Medicine

## 2014-11-25 ENCOUNTER — Encounter: Payer: Self-pay | Admitting: Family Medicine

## 2014-11-25 VITALS — BP 134/76 | HR 74 | Temp 98.6°F | Ht 62.0 in | Wt 126.5 lb

## 2014-11-25 DIAGNOSIS — M25562 Pain in left knee: Secondary | ICD-10-CM

## 2014-11-25 DIAGNOSIS — Z23 Encounter for immunization: Secondary | ICD-10-CM | POA: Diagnosis not present

## 2014-11-25 NOTE — Patient Instructions (Signed)
Xray of knee now  Elevate it when sitting  Put a cold compress on knee 10 minutes at a time every chance you get  Tylenol is ok   We will make a plan when xray is read

## 2014-11-25 NOTE — Progress Notes (Signed)
Pre visit review using our clinic review tool, if applicable. No additional management support is needed unless otherwise documented below in the visit note. 

## 2014-11-25 NOTE — Progress Notes (Signed)
Subjective:    Patient ID: Tammy Neal, female    DOB: January 25, 1951, 64 y.o.   MRN: [id number]  HPI Here with L knee pain - started over a year ago but now getting worse  It hurts and at times it feels unstable  Has not fallen   No known trauma   No swelling  No rash or color change   Today pain is worse laterally - it changes places   It occ catches -sharp pain  Rubbing it helps  Has not tried ice or heat  Tried tylenol   Has psoriasis  No known autoimmune arthritis   Patient Active Problem List   Diagnosis Date Noted  . Routine general medical examination at a health care facility 10/26/2011  . Gynecological examination 07/05/2010  . DEPRESSION 05/03/2010  . NECK PAIN 06/10/2008  . HYPERLIPIDEMIA 02/26/2007  . TOBACCO ABUSE 02/26/2007  . PSORIASIS 02/26/2007   Past Medical History  Diagnosis Date  . Allergy     allergic rhinitis  . Psoriasis   . Neck pain, chronic     with fusion  . Arthritis   . Depression     no per pt  . COPD (chronic obstructive pulmonary disease) ([place])   . Hyperlipidemia     no meds  . Osteoporosis    Past Surgical History  Procedure Laterality Date  . Spine surgery  1977    cervical fusion Dr Carloyn Manner x2  . Abdominal hysterectomy      endometriosis 1 ovary left  . Appendectomy     Social History  Substance Use Topics  . Smoking status: Current Every Day Smoker -- 0.50 packs/day  . Smokeless tobacco: Never Used  . Alcohol Use: No   Family History  Problem Relation Age of Onset  . Hypertension Mother   . Diabetes Mother   . Stroke Mother 19  . Asthma Father   . COPD Father   . Colon cancer Neg Hx   . Esophageal cancer Neg Hx   . Ulcerative colitis Neg Hx   . Stomach cancer Neg Hx    Allergies  Allergen Reactions  . Aspirin     REACTION: GI upset  . Atorvastatin     REACTION: pain  . Simvastatin     REACTION: muscle pain   No current outpatient prescriptions on file prior to visit.   No current  facility-administered medications on file prior to visit.      Review of Systems Review of Systems  Constitutional: Negative for fever, appetite change, fatigue and unexpected weight change.  Eyes: Negative for pain and visual disturbance.  Respiratory: Negative for cough and shortness of breath.   Cardiovascular: Negative for cp or palpitations    Gastrointestinal: Negative for nausea, diarrhea and constipation.  Genitourinary: Negative for urgency and frequency.  Skin: Negative for pallor and pos for rash from psoriasis  MSK pos for L knee pain and stiffness Neurological: Negative for weakness, light-headedness, numbness and headaches.  Hematological: Negative for adenopathy. Does not bruise/bleed easily.  Psychiatric/Behavioral: Negative for dysphoric mood. The patient is not nervous/anxious.         Objective:   Physical Exam  Constitutional: She appears well-developed and well-nourished. No distress.  HENT:  Head: Normocephalic and atraumatic.  Eyes: Conjunctivae and EOM are normal. Pupils are equal, round, and reactive to light.  Neck: Normal range of motion. Neck supple.  Cardiovascular: Normal rate.   Pulmonary/Chest: Effort normal and breath sounds normal.  Diffusely distant bs  Musculoskeletal: She exhibits tenderness. She exhibits no edema.       Left knee: She exhibits decreased range of motion. She exhibits no swelling, no effusion, no ecchymosis, no deformity, no erythema, normal alignment, no LCL laxity, normal patellar mobility, normal meniscus and no MCL laxity. Tenderness found. Lateral joint line tenderness noted.  Stable knee-nl lachman and drawer test  Some crepitus  Pain with flex past 90 deg   Nl rom hips     Lymphadenopathy:    She has no cervical adenopathy.  Neurological: She is alert. She has normal reflexes. She exhibits normal muscle tone.  Skin: Skin is warm and dry.  Baseline psoriasis   Psychiatric: She has a normal mood and affect.           Assessment & Plan:   Problem List Items Addressed This Visit      Other   Left knee pain - Primary    Ongoing L knee pain with recent flare/worse when on it , no swelling or hx of trauma  Suspect OA  Non focal exam  Xray today  Tylenol prn Recommend elevation/ ice prn   Adv further with reading       Relevant Orders   DG Knee AP/LAT W/Sunrise Left (Completed)    Other Visit Diagnoses    Need for influenza vaccination        Relevant Orders    Flu Vaccine QUAD 36+ mos PF IM (Fluarix & Fluzone Quad PF) (Completed)

## 2014-11-26 NOTE — Assessment & Plan Note (Signed)
Ongoing L knee pain with recent flare/worse when on it , no swelling or hx of trauma  Suspect OA  Non focal exam  Xray today  Tylenol prn Recommend elevation/ ice prn   Adv further with reading

## 2014-12-02 ENCOUNTER — Telehealth: Payer: Self-pay | Admitting: Family Medicine

## 2014-12-02 DIAGNOSIS — M25562 Pain in left knee: Secondary | ICD-10-CM

## 2014-12-02 NOTE — Telephone Encounter (Signed)
Done

## 2014-12-02 NOTE — Telephone Encounter (Signed)
Ref to orthop for knee pain

## 2014-12-02 NOTE — Addendum Note (Signed)
Addended by: Loura Pardon A on: 12/02/2014 01:54 PM   Modules accepted: Orders

## 2014-12-02 NOTE — Telephone Encounter (Signed)
Patient's daughter called and said patient would like to be referred to Orthopaedic.  Patient prefers Tammy Neal.  Patient is staying with her daughter in [place].  Patient's daughter said she'll bring patient back when appointment is made.  Patient can be reached at her daughter's phone number.

## 2014-12-04 NOTE — Telephone Encounter (Signed)
Called daughter cell phone as requested regarding referral. No answer, no vm set up

## 2014-12-15 ENCOUNTER — Encounter: Payer: Self-pay | Admitting: Family Medicine

## 2014-12-15 ENCOUNTER — Ambulatory Visit (INDEPENDENT_AMBULATORY_CARE_PROVIDER_SITE_OTHER)
Admission: RE | Admit: 2014-12-15 | Discharge: 2014-12-15 | Disposition: A | Discharge status: Home / Self Care | Payer: BLUE CROSS/BLUE SHIELD | Source: Ambulatory Visit | Attending: Family Medicine | Admitting: Family Medicine

## 2014-12-15 ENCOUNTER — Ambulatory Visit (INDEPENDENT_AMBULATORY_CARE_PROVIDER_SITE_OTHER): Payer: BLUE CROSS/BLUE SHIELD | Admitting: Family Medicine

## 2014-12-15 VITALS — BP 130/64 | HR 69 | Temp 98.6°F | Ht 62.0 in | Wt 128.5 lb

## 2014-12-15 DIAGNOSIS — M25562 Pain in left knee: Secondary | ICD-10-CM | POA: Diagnosis not present

## 2014-12-15 DIAGNOSIS — M25552 Pain in left hip: Secondary | ICD-10-CM | POA: Diagnosis not present

## 2014-12-15 DIAGNOSIS — M1612 Unilateral primary osteoarthritis, left hip: Secondary | ICD-10-CM

## 2014-12-15 DIAGNOSIS — M7632 Iliotibial band syndrome, left leg: Secondary | ICD-10-CM | POA: Diagnosis not present

## 2014-12-15 DIAGNOSIS — L409 Psoriasis, unspecified: Secondary | ICD-10-CM

## 2014-12-15 MED ORDER — PREDNISONE 20 MG PO TABS: ORAL_TABLET | ORAL | Status: DC

## 2014-12-15 MED ORDER — TRAMADOL HCL 50 MG PO TABS: 50.0000 mg | ORAL_TABLET | Freq: Four times a day (QID) | ORAL | Status: DC | PRN

## 2014-12-15 NOTE — Progress Notes (Signed)
Dr. Frederico Hamman T. Jame Seelig, MD, [facility] [place], [address] Phone: [phone] Fax: [phone]  12/15/2014  Patient: Tammy Neal, MRN: [id number], DOB: October 07, 1950, 64 y.o.  Primary Physician:  Tammy Pardon, MD   Chief Complaint  Patient presents with  . Hip Pain    Left-radiates down to knee   Subjective:   Tammy Neal is a 64 y.o. very pleasant female patient who presents with the following:  Consulting MD: Dr. Glori Bickers Reason: L Hip pain  Pleasant patient who is been having some intermittent problems off and on for some time, but for the last few months she has been having quite a bit of hip and groin pain on the left, and this is radiating down her leg more on the lateral aspect.  She points also having some pain in the true groin.  Also within the last month or 2 she has been developing some left knee pain, and has some lateral pain and pain along the ITB.  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  Her primary complaint is her left hip.  Additional history is provided by her daughter today.  I reviewed all films with the patient face-to-face in the office today including both her knee and her hip films.  Left hip shows mild to moderate osteoarthritic change with relatively prominent bone spurs, notably inferiorly as well as joint space narrowing.  Relatively the right hip is more well preserved compared to the left.  Left knee is quite preserved with only a minimum amount of osteoarthritis on the [place] view in the medial compartment.  Overall minimal osteoarthritic change for age.  Past Medical History, Surgical History, Social History, Family History, Problem List, Medications, and Allergies have been reviewed and updated if relevant.  Patient Active Problem List   Diagnosis Date Noted  . Left knee pain 11/25/2014  . Routine general  medical examination at a health care facility 10/26/2011  . Gynecological examination 07/05/2010  . DEPRESSION 05/03/2010  . NECK PAIN 06/10/2008  . HYPERLIPIDEMIA 02/26/2007  . TOBACCO ABUSE 02/26/2007  . PSORIASIS 02/26/2007    Past Medical History  Diagnosis Date  . Allergy     allergic rhinitis  . Psoriasis   . Neck pain, chronic     with fusion  . Arthritis   . Depression     no per pt  . COPD (chronic obstructive pulmonary disease) ([place])   . Hyperlipidemia     no meds  . Osteoporosis     Past Surgical History  Procedure Laterality Date  . Spine surgery  1977    cervical fusion Dr Carloyn Manner x2  . Abdominal hysterectomy      endometriosis 1 ovary left  . Appendectomy      Social History   Social History  . Marital Status: Single    Spouse Name: N/A  . Number of Children: N/A  . Years of Education: N/A   Occupational History  . Not on file.   Social History Main Topics  . Smoking status: Current Every Day Smoker -- 0.50 packs/day  . Smokeless tobacco: Never Used  . Alcohol Use: No  . Drug Use: No  . Sexual Activity: Not on file   Other Topics Concern  . Not on file   Social History Narrative    Family History  Problem Relation Age of  Onset  . Hypertension Mother   . Diabetes Mother   . Stroke Mother 41  . Asthma Father   . COPD Father   . Colon cancer Neg Hx   . Esophageal cancer Neg Hx   . Ulcerative colitis Neg Hx   . Stomach cancer Neg Hx     Allergies  Allergen Reactions  . Aspirin     REACTION: GI upset  . Atorvastatin     REACTION: pain  . Simvastatin     REACTION: muscle pain    Medication list reviewed and updated in full in [facility].  GEN: No fevers, chills. Nontoxic. Primarily MSK c/o today. MSK: Detailed in the HPI GI: tolerating PO intake without difficulty Neuro: No numbness, parasthesias, or tingling associated. Otherwise, the pertinent positives and negatives are listed above and in the HPI, otherwise a full  review of systems has been reviewed and is negative unless noted positive.   Objective:   BP 130/64 mmHg  Pulse 69  Temp(Src) 98.6 F (37 C) (Oral)  Ht 5\' 2"  (1.575 m)  Wt 128 lb 8 oz (58.287 kg)  BMI 23.50 kg/m2  GEN: WDWN, NAD, Non-toxic, A & O x 3 HEENT: Atraumatic, Normocephalic. Neck supple. No masses, No LAD. Ears and Nose: No external deformity. CV: RRR, No M/G/R. No JVD. No thrill. No extra heart sounds. PULM: CTA B, no wheezes, crackles, rhonchi. No retractions. No resp. distress. No accessory muscle use. EXTR: No c/c/e NEURO normal sensation and strength throughout. PSYCH: Normally interactive. Conversant. Not depressed or anxious appearing.  Calm demeanor.    HIP EXAM: SIDE: L ROM: Abduction, Flexion, Internal and External range of motion: abduction is fairly limited.  The patient is only able to abduct about 30-35.  Approximate 15 loss of motion and internal and external range of motion, and these all do cause pain terminally Pain with terminal IROM and EROM: yes GTB: NT SLR: NEG Knees: No effusion FABER: NT REVERSE FABER: NT, neg Piriformis: NT at direct palpation Str: flexion: 5/5 abduction: 4+/5 adduction: 5/5 Strength testing non-tender  Knee:  L Gait: moderate trendelenburg gait ROM: 0-135 Effusion: neg Echymosis or edema: none Patellar tendon NT Painful PLICA: neg Patellar grind: negative Medial and lateral patellar facet loading: negative medial and lateral joint lines:NT Mcmurray's neg Flexion-pinch neg Varus and valgus stress: stable Lachman: neg [place] and [place] drawer: neg Hip abduction, IR, ER: WNL Hip flexion str: 5/5 Hip abd: 5/5 Quad: 5/5 VMO atrophy:No Hamstring concentric and eccentric: 5/5   Ober's and Noble exam +  Radiology: Dg Knee Ap/lat W/sunrise Left  11/25/2014  CLINICAL DATA:  Progressive left knee pain over the past year, history of psoriasis, nose soft tissue swelling, no history of trauma. EXAM: LEFT KNEE 3 VIEWS  COMPARISON:  None in PACs FINDINGS: The bones of the left knee are adequately mineralized. There is beaking of the medial tibial spine. There is minimal narrowing of the medial joint compartment. There is no joint effusion. No erosive changes are observed. The patellofemoral joint exhibits no acute abnormality. The proximal fibula is intact. IMPRESSION: There are minimal osteoarthritic changes of the left knee centered on the medial compartment. There is no acute bony abnormality. Electronically Signed   By: David  Martinique M.D.   On: 11/25/2014 13:32   Dg Hip Unilat With Pelvis 2-3 Views Left  12/15/2014  CLINICAL DATA:  Left hip pain for several months. EXAM: DG HIP (WITH OR WITHOUT PELVIS) 2-3V LEFT COMPARISON:  None. FINDINGS: No  fracture.  No bone lesion. There is mild concentric left hip joint space narrowing. Marginal osteophytes project from the femoral head, mostly inferiorly. There is bony productive change along the superior lateral acetabulum. There is mild sclerosis along the inferior margin of the left acetabulum and more subtly along the superior aspect. Bones are demineralized. SI joints and symphysis pubis are normally aligned. Soft tissues are unremarkable. IMPRESSION: 1. No fracture or acute finding.  No bone lesion. 2. Arthropathic changes of the left hip. There are more subtle arthropathic changes of the right hip. Electronically Signed   By: Lajean Manes M.D.   On: 12/15/2014 16:32    Assessment and Plan:   Primary osteoarthritis of left hip  Left hip pain - Plan: DG HIP UNILAT WITH PELVIS 2-3 VIEWS LEFT  Left knee pain  Iliotibial band syndrome affecting lower leg, left  Psoriasis  The patient's primary problem is her left hip with some fairly prominent bone spurs that are most likely limiting her motion.  She also has some mild to moderate arthritis of the left hip.  Her gait is off, and I suspect that this is the reason that her knee is hurting.  Her knee has excellent  range of motion, no sign of internal derangement and minimal to no arthritic change on films.  Multiple areas in the body, I'm going to give the patient some oral prednisone to try to combat multiple joints.  Tramadol p.r.n. Along with Tylenol.  Noted aspirin allergy, and the patient cannot take NSAIDs.  If she is still symptomatic in  4-6 weeks, then I think an ultrasound guided intra-articular hip injection would likely be of benefit.  I appreciate the opportunity to evaluate this very friendly patient. If you have any question regarding her care or prognosis, do not hesitate to ask.   Follow-up: 6 weeks if not better, if better prn  New Prescriptions   PREDNISONE (DELTASONE) 20 MG TABLET    2 tablets a day for 5 days, then 1 tablet a day for 5 days   TRAMADOL (ULTRAM) 50 MG TABLET    Take 1 tablet (50 mg total) by mouth every 6 (six) hours as needed.   Modified Medications   No medications on file   Orders Placed This Encounter  Procedures  . DG HIP UNILAT WITH PELVIS 2-3 VIEWS LEFT    Signed,  Tammy Wiemers T. Shanae Luo, MD   Patient's Medications  New Prescriptions   PREDNISONE (DELTASONE) 20 MG TABLET    2 tablets a day for 5 days, then 1 tablet a day for 5 days   TRAMADOL (ULTRAM) 50 MG TABLET    Take 1 tablet (50 mg total) by mouth every 6 (six) hours as needed.  Previous Medications   No medications on file  Modified Medications   No medications on file  Discontinued Medications   No medications on file

## 2014-12-15 NOTE — Progress Notes (Signed)
Pre visit review using our clinic review tool, if applicable. No additional management support is needed unless otherwise documented below in the visit note. 

## 2015-01-02 ENCOUNTER — Encounter: Payer: Self-pay | Admitting: Family Medicine

## 2015-01-02 ENCOUNTER — Ambulatory Visit (INDEPENDENT_AMBULATORY_CARE_PROVIDER_SITE_OTHER): Payer: BLUE CROSS/BLUE SHIELD | Admitting: Family Medicine

## 2015-01-02 VITALS — BP 114/64 | HR 68 | Temp 98.0°F | Ht 62.0 in | Wt 128.5 lb

## 2015-01-02 DIAGNOSIS — G3184 Mild cognitive impairment, so stated: Secondary | ICD-10-CM | POA: Diagnosis not present

## 2015-01-02 DIAGNOSIS — A084 Viral intestinal infection, unspecified: Secondary | ICD-10-CM | POA: Insufficient documentation

## 2015-01-02 DIAGNOSIS — F17200 Nicotine dependence, unspecified, uncomplicated: Secondary | ICD-10-CM

## 2015-01-02 MED ORDER — DONEPEZIL HCL 5 MG PO TABS: 5.0000 mg | ORAL_TABLET | Freq: Every day | ORAL | Status: DC

## 2015-01-02 NOTE — Progress Notes (Signed)
Subjective:    Patient ID: Tammy Neal, female    DOB: June 02, 1950, 64 y.o.   MRN: [id number]  HPI Here for f/u of hosp in [place] 12/18/08/30   She was very sick with n/v/d - dx with viral gastroenteritis  Was admitted for w/u of this - and 12 hours of symptom  CT of abdomen and pelvis  Some diverticulosis prominent loops of small bowel   Lots of fluids   Last 2 days feels just about 100% Still GI system is unsettled Ate very bland food and soups for the first week   Had eaten at [employer]-chicken tenders/ sweet potato/ green beans   Family thinks she does not drink enough fluids   Memory issues are also getting worse  Still smokes  Also decreased motivation  Does not feel depressed  Would be open to medication  Patient Active Problem List   Diagnosis Date Noted  . Viral gastroenteritis 01/02/2015  . Left knee pain 11/25/2014  . Routine general medical examination at a health care facility 10/26/2011  . Gynecological examination 07/05/2010  . DEPRESSION 05/03/2010  . NECK PAIN 06/10/2008  . HYPERLIPIDEMIA 02/26/2007  . TOBACCO ABUSE 02/26/2007  . PSORIASIS 02/26/2007   Past Medical History  Diagnosis Date  . Allergy     allergic rhinitis  . Psoriasis   . Neck pain, chronic     with fusion  . Arthritis   . Depression     no per pt  . COPD (chronic obstructive pulmonary disease) ([place])   . Hyperlipidemia     no meds  . Osteoporosis    Past Surgical History  Procedure Laterality Date  . Spine surgery  1977    cervical fusion Dr Carloyn Manner x2  . Abdominal hysterectomy      endometriosis 1 ovary left  . Appendectomy     Social History  Substance Use Topics  . Smoking status: Current Every Day Smoker -- 0.50 packs/day  . Smokeless tobacco: Never Used  . Alcohol Use: No   Family History  Problem Relation Age of Onset  . Hypertension Mother   . Diabetes Mother   . Stroke Mother 62  . Asthma Father   . COPD Father   . Colon cancer Neg Hx   .  Esophageal cancer Neg Hx   . Ulcerative colitis Neg Hx   . Stomach cancer Neg Hx    Allergies  Allergen Reactions  . Aspirin     REACTION: GI upset  . Atorvastatin     REACTION: pain  . Simvastatin     REACTION: muscle pain   No current outpatient prescriptions on file prior to visit.   No current facility-administered medications on file prior to visit.     Review of Systems Review of Systems  Constitutional: Negative for fever, appetite change,  and unexpected weight change. pos for fatigue  Eyes: Negative for pain and visual disturbance.  Respiratory: Negative for cough and shortness of breath.   Cardiovascular: Negative for cp or palpitations    Gastrointestinal: Negative for nausea, diarrhea and constipation. (improved) Genitourinary: Negative for urgency and frequency.  Skin: Negative for pallor or rash   Neurological: Negative for weakness, light-headedness, numbness and headaches.  Hematological: Negative for adenopathy. Does not bruise/bleed easily.  Psychiatric/Behavioral: Negative for dysphoric mood. The patient is not nervous/anxious.  pos for short term memory loss and problems concentrating        Objective:   Physical Exam  Constitutional: She appears well-developed  and well-nourished. No distress.  Well appearing   HENT:  Head: Normocephalic and atraumatic.  Mouth/Throat: Oropharynx is clear and moist.  Eyes: Conjunctivae and EOM are normal. Pupils are equal, round, and reactive to light. No scleral icterus.  Neck: Normal range of motion. Neck supple.  Cardiovascular: Normal rate, regular rhythm and normal heart sounds.   Pulmonary/Chest: Effort normal and breath sounds normal. No respiratory distress. She has no wheezes. She has no rales.  Diffusely distant bs   Abdominal: Soft. Bowel sounds are normal. She exhibits no distension and no mass. There is no tenderness. There is no rebound and no guarding.  Lymphadenopathy:    She has no cervical  adenopathy.  Neurological: She is alert.  Skin: Skin is warm and dry. No erythema. No pallor.  Psychiatric: She has a normal mood and affect.  Very quiet  Answers questions appropriately          Assessment & Plan:   Problem List Items Addressed This Visit      Digestive   Viral gastroenteritis - Primary    Rev hospital records and studies re: recent gastroenteritis  Doing better  Enc better hydration for kidney and overall health  Will update if symptoms return         Nervous and Auditory   Mild cognitive impairment    Pt did get confused in the hospital as expected  Also her family notes her short term memory has continued to worsen since last visit  Denies depression  Continues to smoke with no intention of quitting  Will begin aricept 5 mg (in the next several weeks)-disc poss side eff including nausea  Will titrate up as tolerated F/u planned  Safety discussed         Other   TOBACCO ABUSE    Disc in detail risks of smoking and possible outcomes including copd, vascular/ heart disease, cancer , respiratory and sinus infections  Pt voices understanding  She does not intend to quit

## 2015-01-02 NOTE — Progress Notes (Signed)
Pre visit review using our clinic review tool, if applicable. No additional management support is needed unless otherwise documented below in the visit note. 

## 2015-01-02 NOTE — Patient Instructions (Signed)
I'm glad you are doing better  Work on hydration - 10-12 servings of fluids daily - heavy on water / limiting tea/dark soda with caffeine  and coffee  Gradually advance diet  Wipe surfaces with bleach cleanser   If symptoms re occur - let me know   Start aricept 5 mg at bedtime (when you are ready)- if intolerable side effects -stop it and let me know  Think about quitting smoking   Follow up with me in about 2 months

## 2015-01-03 NOTE — Assessment & Plan Note (Signed)
Pt did get confused in the hospital as expected  Also her family notes her short term memory has continued to worsen since last visit  Denies depression  Continues to smoke with no intention of quitting  Will begin aricept 5 mg (in the next several weeks)-disc poss side eff including nausea  Will titrate up as tolerated F/u planned  Safety discussed

## 2015-01-03 NOTE — Assessment & Plan Note (Signed)
Disc in detail risks of smoking and possible outcomes including copd, vascular/ heart disease, cancer , respiratory and sinus infections  Pt voices understanding  She does not intend to quit

## 2015-01-03 NOTE — Assessment & Plan Note (Signed)
Rev hospital records and studies re: recent gastroenteritis  Doing better  Enc better hydration for kidney and overall health  Will update if symptoms return

## 2015-01-06 ENCOUNTER — Telehealth: Payer: Self-pay

## 2015-01-06 MED ORDER — RIVASTIGMINE 4.6 MG/24HR TD PT24: 4.6000 mg | MEDICATED_PATCH | Freq: Every day | TRANSDERMAL | Status: DC

## 2015-01-06 NOTE — Telephone Encounter (Signed)
Daughter and pt called back, they were notified of Dr. Marliss Coots instructions and recommendations and verbalized understanding, Rx sent to pharmacy and pt will try patch next week

## 2015-01-06 NOTE — Telephone Encounter (Signed)
Called daughter back and no answer and no voicemail set up, called pt's home # and no answer so left voicemail requesting pt to call office back

## 2015-01-06 NOTE — Telephone Encounter (Signed)
Tammy Neal pts daughter (DPR signed) left v/m; pt started 1st dose of aricept at bedtime last night; pt woke up 30 mins after taking med pt had itchy skin, felt jittery and nervous, felt funny all over. Tammy Neal looked up med and thinks these can be normal side effects of med. Pt did not have any nausea. Tammy Neal wants to know if should try to take aricept again tonight or what to do. Natasha request cb.

## 2015-01-06 NOTE — Telephone Encounter (Signed)
I would not continue it - ? If early allergic reaction   If affordable the next thing I would try is the exelon patch (low dose)- will write for call in  Let the aricept get out of her system for a week or so before you try it Keep me posted

## 2015-01-14 ENCOUNTER — Ambulatory Visit: Payer: BLUE CROSS/BLUE SHIELD | Admitting: Family Medicine

## 2015-01-21 ENCOUNTER — Ambulatory Visit (INDEPENDENT_AMBULATORY_CARE_PROVIDER_SITE_OTHER): Payer: BLUE CROSS/BLUE SHIELD | Admitting: Family Medicine

## 2015-01-21 ENCOUNTER — Encounter: Payer: Self-pay | Admitting: Family Medicine

## 2015-01-21 VITALS — BP 120/70 | HR 70 | Temp 98.5°F | Ht 62.0 in | Wt 128.2 lb

## 2015-01-21 DIAGNOSIS — M1612 Unilateral primary osteoarthritis, left hip: Secondary | ICD-10-CM

## 2015-01-21 DIAGNOSIS — M25552 Pain in left hip: Secondary | ICD-10-CM | POA: Diagnosis not present

## 2015-01-21 MED ORDER — DICLOFENAC SODIUM 75 MG PO TBEC: 75.0000 mg | DELAYED_RELEASE_TABLET | Freq: Two times a day (BID) | ORAL | Status: DC

## 2015-01-21 MED ORDER — METHYLPREDNISOLONE ACETATE 40 MG/ML IJ SUSP
80.0000 mg | Freq: Once | INTRAMUSCULAR | Status: AC
  Administered 2015-01-21: 80 mg via INTRA_ARTICULAR

## 2015-01-21 NOTE — Progress Notes (Signed)
Pre visit review using our clinic review tool, if applicable. No additional management support is needed unless otherwise documented below in the visit note. 

## 2015-01-21 NOTE — Progress Notes (Signed)
Dr. Frederico Hamman T. Lexx Monte, MD, [facility] [place], [address] Phone: [phone] Fax: [phone]  01/21/2015  Patient: Tammy Neal, MRN: [id number], DOB: Jul 20, 1950, 64 y.o.  Primary Physician:  Loura Pardon, MD   Chief Complaint  Patient presents with  . Follow-up    Left Hip   Subjective:   Tammy Neal is a 64 y.o. very pleasant female patient who presents with the following:  F/u L hip: Dr. Glori Bickers asked me to f/u about her hip. Known moderate L hip OA.  Hip will hurt and get catches - all on the left side.  Stiff when waking up, keeps going all day long. Now sometimes using her cane.  She is here with her daughter.   We reviewed her hip films together again: She has at least moderate level osteoarthritic change in the left hip with some significant loss  Of her joint space, and she has a prominent inferior spur.  12/15/2014 Last OV with Owens Loffler, MD  Consulting MD: Dr. Glori Bickers Reason: L Hip pain  Pleasant patient who is been having some intermittent problems off and on for some time, but for the last few months she has been having quite a bit of hip and groin pain on the left, and this is radiating down her leg more on the lateral aspect.  She points also having some pain in the true groin.  Also within the last month or 2 she has been developing some left knee pain, and has some lateral pain and pain along the ITB.  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  Her primary complaint is her left hip.  Additional history is provided by her daughter today.  I reviewed all films with the patient face-to-face in the office today including both her knee and her hip films.  Left hip shows mild to moderate osteoarthritic change with relatively prominent bone spurs, notably inferiorly as well as joint space narrowing.  Relatively the right hip  is more well preserved compared to the left.  Left knee is quite preserved with only a minimum amount of osteoarthritis on the [place] view in the medial compartment.  Overall minimal osteoarthritic change for age.  Past Medical History, Surgical History, Social History, Family History, Problem List, Medications, and Allergies have been reviewed and updated if relevant.  Patient Active Problem List   Diagnosis Date Noted  . Viral gastroenteritis 01/02/2015  . Mild cognitive impairment 01/02/2015  . Left knee pain 11/25/2014  . Routine general medical examination at a health care facility 10/26/2011  . Gynecological examination 07/05/2010  . DEPRESSION 05/03/2010  . NECK PAIN 06/10/2008  . HYPERLIPIDEMIA 02/26/2007  . TOBACCO ABUSE 02/26/2007  . PSORIASIS 02/26/2007    Past Medical History  Diagnosis Date  . Allergy     allergic rhinitis  . Psoriasis   . Neck pain, chronic     with fusion  . Arthritis   . Depression     no per pt  . COPD (chronic obstructive pulmonary disease) ([place])   . Hyperlipidemia     no meds  . Osteoporosis     Past Surgical History  Procedure Laterality Date  . Spine surgery  1977    cervical fusion Dr Carloyn Manner x2  . Abdominal hysterectomy      endometriosis 1 ovary left  . Appendectomy  Social History   Social History  . Marital Status: Single    Spouse Name: N/A  . Number of Children: N/A  . Years of Education: N/A   Occupational History  . Not on file.   Social History Main Topics  . Smoking status: Current Every Day Smoker -- 0.50 packs/day  . Smokeless tobacco: Never Used  . Alcohol Use: No  . Drug Use: No  . Sexual Activity: Not on file   Other Topics Concern  . Not on file   Social History Narrative    Family History  Problem Relation Age of Onset  . Hypertension Mother   . Diabetes Mother   . Stroke Mother 8  . Asthma Father   . COPD Father   . Colon cancer Neg Hx   . Esophageal cancer Neg Hx   . Ulcerative  colitis Neg Hx   . Stomach cancer Neg Hx     Allergies  Allergen Reactions  . Aricept [Donepezil Hcl] Itching    Itchy, jittery (no hives)  . Aspirin     REACTION: GI upset  . Atorvastatin     REACTION: pain  . Simvastatin     REACTION: muscle pain    Medication list reviewed and updated in full in [place].  GEN: No fevers, chills. Nontoxic. Primarily MSK c/o today. MSK: Detailed in the HPI GI: tolerating PO intake without difficulty Neuro: No numbness, parasthesias, or tingling associated. Otherwise, the pertinent positives and negatives are listed above and in the HPI, otherwise a full review of systems has been reviewed and is negative unless noted positive.   Objective:   BP 120/70 mmHg  Pulse 70  Temp(Src) 98.5 F (36.9 C) (Oral)  Ht 5\' 2"  (1.575 m)  Wt 128 lb 4 oz (58.174 kg)  BMI 23.45 kg/m2  GEN: WDWN, NAD, Non-toxic, A & O x 3 HEENT: Atraumatic, Normocephalic. Neck supple. No masses, No LAD. Ears and Nose: No external deformity. CV: RRR, No M/G/R. No JVD. No thrill. No extra heart sounds. PULM: CTA B, no wheezes, crackles, rhonchi. No retractions. No resp. distress. No accessory muscle use. EXTR: No c/c/e NEURO normal sensation and strength throughout. PSYCH: Normally interactive. Conversant. Not depressed or anxious appearing.  Calm demeanor.    HIP EXAM: SIDE: L ROM: Abduction, Flexion, Internal and External range of motion: abduction is fairly limited.  The patient is only able to abduct about 30-35.  Approximate 15 loss of motion and internal and external range of motion, and these all do cause pain terminally Pain with terminal IROM and EROM: yes GTB: NT SLR: NEG Knees: No effusion FABER: NT REVERSE FABER: NT, neg Piriformis: NT at direct palpation Str: flexion: 5/5 abduction: 4+/5 adduction: 5/5 Strength testing non-tender  Radiology: Dg Hip Unilat With Pelvis 2-3 Views Left  12/15/2014  CLINICAL DATA:  Left hip pain for several  months. EXAM: DG HIP (WITH OR WITHOUT PELVIS) 2-3V LEFT COMPARISON:  None. FINDINGS: No fracture.  No bone lesion. There is mild concentric left hip joint space narrowing. Marginal osteophytes project from the femoral head, mostly inferiorly. There is bony productive change along the superior lateral acetabulum. There is mild sclerosis along the inferior margin of the left acetabulum and more subtly along the superior aspect. Bones are demineralized. SI joints and symphysis pubis are normally aligned. Soft tissues are unremarkable. IMPRESSION: 1. No fracture or acute finding.  No bone lesion. 2. Arthropathic changes of the left hip. There are more subtle arthropathic changes  of the right hip. Electronically Signed   By: Lajean Manes M.D.   On: 12/15/2014 16:32   Assessment and Plan:   Primary osteoarthritis of left hip  Left hip pain - Plan: methylPREDNISolone acetate (DEPO-MEDROL) injection 80 mg  Intraarticular hip injection would be a reasonable modality to try for relief. I do not think her arthritis has progressed to the point of needing THA yet.   Patient reluctant to take routine medication, but willing to use some prn NSAIDS with flares.   Ultrasound guided hip injection, LEFT Verbal and written consent was obtained. Potential complications including infection, bleeding, anesthesia to nerve were all discussed with the patient, and he would like to proceed. Using ultrasound machine, the intra-articular space was located. The patient was then prepped with Chloraprep x 2. A 22-gauge spinal needle was visualized going to the joint capsule, and fluid was observed entering the joint capsule. The combination of 8 cc of lidocaine 1% and 2 mL of Depo-Medrol 40 mg, equaling Depo-Medrol 80 mg was injected into the intra-articular hip space. The patient tolerated the procedure well and had almost immediate improvement of his pain symptoms. No complications. Procedure was tolerated well.   Follow-up:  prn  New Prescriptions   DICLOFENAC (VOLTAREN) 75 MG EC TABLET    Take 1 tablet (75 mg total) by mouth 2 (two) times daily.   Signed,  Maud Deed. Kalifa Cadden, MD   Patient's Medications  New Prescriptions   DICLOFENAC (VOLTAREN) 75 MG EC TABLET    Take 1 tablet (75 mg total) by mouth 2 (two) times daily.  Previous Medications   ACETAMINOPHEN ([facility]S 8 HOUR PAIN RELIEF) 650 MG CR TABLET    Take 1 capsule by mouth 2 (two) times daily as needed.   RIVASTIGMINE (EXELON) 4.6 MG/24HR    Place 1 patch (4.6 mg total) onto the skin daily.  Modified Medications   No medications on file  Discontinued Medications   No medications on file

## 2015-04-29 ENCOUNTER — Emergency Department
Admission: EM | Admit: 2015-04-29 | Discharge: 2015-04-29 | Disposition: A | Discharge status: Home / Self Care | Payer: Medicare Other | Attending: Emergency Medicine | Admitting: Emergency Medicine

## 2015-04-29 ENCOUNTER — Telehealth: Payer: Self-pay | Admitting: Family Medicine

## 2015-04-29 DIAGNOSIS — R220 Localized swelling, mass and lump, head: Secondary | ICD-10-CM | POA: Diagnosis not present

## 2015-04-29 DIAGNOSIS — L299 Pruritus, unspecified: Secondary | ICD-10-CM | POA: Diagnosis not present

## 2015-04-29 DIAGNOSIS — F17200 Nicotine dependence, unspecified, uncomplicated: Secondary | ICD-10-CM | POA: Diagnosis not present

## 2015-04-29 DIAGNOSIS — Z79899 Other long term (current) drug therapy: Secondary | ICD-10-CM | POA: Diagnosis not present

## 2015-04-29 DIAGNOSIS — T7840XA Allergy, unspecified, initial encounter: Secondary | ICD-10-CM | POA: Diagnosis not present

## 2015-04-29 DIAGNOSIS — Z791 Long term (current) use of non-steroidal anti-inflammatories (NSAID): Secondary | ICD-10-CM | POA: Insufficient documentation

## 2015-04-29 DIAGNOSIS — R221 Localized swelling, mass and lump, neck: Secondary | ICD-10-CM

## 2015-04-29 DIAGNOSIS — T490X5A Adverse effect of local antifungal, anti-infective and anti-inflammatory drugs, initial encounter: Secondary | ICD-10-CM | POA: Diagnosis not present

## 2015-04-29 MED ORDER — DIPHENHYDRAMINE HCL 50 MG/ML IJ SOLN
25.0000 mg | Freq: Once | INTRAMUSCULAR | Status: AC
  Administered 2015-04-29: 25 mg via INTRAVENOUS
  Filled 2015-04-29: qty 1

## 2015-04-29 MED ORDER — PREDNISONE 20 MG PO TABS: 60.0000 mg | ORAL_TABLET | Freq: Every day | ORAL | Status: DC

## 2015-04-29 MED ORDER — FAMOTIDINE IN NACL 20-0.9 MG/50ML-% IV SOLN
20.0000 mg | Freq: Once | INTRAVENOUS | Status: AC
  Administered 2015-04-29: 20 mg via INTRAVENOUS
  Filled 2015-04-29: qty 50

## 2015-04-29 MED ORDER — DIPHENHYDRAMINE HCL 25 MG PO TABS
25.0000 mg | ORAL_TABLET | Freq: Four times a day (QID) | ORAL | Status: DC | PRN
Start: 2015-04-29 — End: 2015-06-18

## 2015-04-29 MED ORDER — METHYLPREDNISOLONE SODIUM SUCC 125 MG IJ SOLR
125.0000 mg | Freq: Once | INTRAMUSCULAR | Status: AC
  Administered 2015-04-29: 125 mg via INTRAVENOUS
  Filled 2015-04-29: qty 2

## 2015-04-29 MED ORDER — FAMOTIDINE 40 MG PO TABS: 40.0000 mg | ORAL_TABLET | Freq: Every evening | ORAL | Status: DC

## 2015-04-29 NOTE — ED Notes (Signed)
Pt took Voltaren for the first time last night, thinks she is having allergic reaction. Awoke X 2 with SOB and felt like her throat was "tightening". Pt alert and oriented X4, active, cooperative, pt in NAD. RR even and unlabored, color WNL.

## 2015-04-29 NOTE — Discharge Instructions (Signed)
Allergies An allergy is an abnormal reaction to a substance by the body's defense system (immune system). Allergies can develop at any age. WHAT CAUSES ALLERGIES? An allergic reaction happens when the immune system mistakenly reacts to a normally harmless substance, called an allergen, as if it were harmful. The immune system releases antibodies to fight the substance. Antibodies eventually release a chemical called histamine into the bloodstream. The release of histamine is meant to protect the body from infection, but it also causes discomfort. An allergic reaction can be triggered by:  Eating an allergen.  Inhaling an allergen.  Touching an allergen. WHAT TYPES OF ALLERGIES ARE THERE? There are many types of allergies. Common types include:  Seasonal allergies. People with this type of allergy are usually allergic to substances that are only present during certain seasons, such as molds and pollens.  Food allergies.  Drug allergies.  Insect allergies.  Animal dander allergies. WHAT ARE SYMPTOMS OF ALLERGIES? Possible allergy symptoms include:  Swelling of the lips, face, tongue, mouth, or throat.  Sneezing, coughing, or wheezing.  Nasal congestion.  Tingling in the mouth.  Rash.  Itching.  Itchy, red, swollen areas of skin (hives).  Watery eyes.  Vomiting.  Diarrhea.  Dizziness.  Lightheadedness.  Fainting.  Trouble breathing or swallowing.  Chest tightness.  Rapid heartbeat. HOW ARE ALLERGIES DIAGNOSED? Allergies are diagnosed with a medical and family history and one or more of the following:  Skin tests.  Blood tests.  A food diary. A food diary is a record of all the foods and drinks you have in a day and of all the symptoms you experience.  The results of an elimination diet. An elimination diet involves eliminating foods from your diet and then adding them back in one by one to find out if a certain food causes an allergic reaction. HOW ARE  ALLERGIES TREATED? There is no cure for allergies, but allergic reactions can be treated with medicine. Severe reactions usually need to be treated at a hospital. HOW CAN REACTIONS BE PREVENTED? The best way to prevent an allergic reaction is by avoiding the substance you are allergic to. Allergy shots and medicines can also help prevent reactions in some cases. People with severe allergic reactions may be able to prevent a life-threatening reaction called anaphylaxis with a medicine given right after exposure to the allergen.   This information is not intended to replace advice given to you by your health care provider. Make sure you discuss any questions you have with your health care provider.   Document Released: 05/03/2002 Document Revised: 02/28/2014 Document Reviewed: 11/19/2013 Elsevier Interactive Patient Education 2016 Elsevier Inc.  Pruritus Pruritus is an itching feeling. There are many different conditions and factors that can make your skin itchy. Dry skin is one of the most common causes of itching. Most cases of itching do not require medical attention. Itchy skin can turn into a rash.  HOME CARE INSTRUCTIONS  Watch your pruritus for any changes. Take these steps to help with your condition:  Skin Care  Moisturize your skin as needed. A moisturizer that contains petroleum jelly is best for keeping moisture in your skin.  Take or apply medicines only as directed by your health care provider. This may include:  Corticosteroid cream.  Anti-itch lotions.  Oral anti-histamines.  Apply cool compresses to the affected areas.  Try taking a bath with:  Epsom salts. Follow the instructions on the packaging. You can get these at your local pharmacy or grocery  store.  Baking soda. Pour a small amount into the bath as directed by your health care provider.  Colloidal oatmeal. Follow the instructions on the packaging. You can get this at your local pharmacy or grocery  store.  Try applying baking soda paste to your skin. Stir water into baking soda until it reaches a paste-like consistency.   Do not scratch your skin.  Avoid hot showers or baths, which can make itching worse. A cold shower may help with itching as long as you use a moisturizer after.  Avoid scented soaps, detergents, and perfumes. Use gentle soaps, detergents, perfumes, and other cosmetic products. General Instructions  Avoid wearing tight clothes.  Keep a journal to help track what causes your itch. Write down:  What you eat.  What cosmetic products you use.  What you drink.  What you wear. This includes jewelry.  Use a humidifier. This keeps the air moist, which helps to prevent dry skin. SEEK MEDICAL CARE IF:  The itching does not go away after several days.  You sweat at night.  You have weight loss.  You are unusually thirsty.  You urinate more than normal.  You are more tired than normal.  You have abdominal pain.  Your skin tingles.  You feel weak.  Your skin or the whites of your eyes look yellow (jaundice).  Your skin feels numb.   This information is not intended to replace advice given to you by your health care provider. Make sure you discuss any questions you have with your health care provider.   Document Released: 10/20/2010 Document Revised: 06/24/2014 Document Reviewed: 02/03/2014 Elsevier Interactive Patient Education [employer].

## 2015-04-29 NOTE — Telephone Encounter (Signed)
This is an ER follow up.  Please change to a 30 min appt.

## 2015-04-29 NOTE — ED Notes (Signed)
Report to Janie, RN.

## 2015-04-29 NOTE — ED Provider Notes (Signed)
[facility] Emergency Department Provider Note  ____________________________________________  Time seen: Approximately [id number] AM  I have reviewed the triage vital signs and the nursing notes.   HISTORY  Chief Complaint Allergic Reaction    HPI Tammy Neal is a 65 y.o. female who comes in today with some throat swelling and itching. The patient started a new medication today. The patient's medication is diclofenac. She has a history of rheumatoid arthritis and had a cortisone shot in her hip multiple months ago. The patient was given the medication in the event that she started having some pain. The patient took a dose of her medicine last night around 9 PM. The patient reports that she started having some itching in her hands and itching everywhere. She went and spoke to her daughter at some point and then went back to bed. The patient's daughter reports that she came back around 4 panicky in saying that she felt as though her throat was closing and her voice was hoarse. The patient had a similar reaction to a memory medication but they do not remember the name. She reports that currently her throat feels okay. The patient did not take any medication prior to coming in. She reports that she was very scared due to her symptoms.The patient did not feel short of breath. She did have some difficulty swallowing as well.   Past Medical History  Diagnosis Date  . Allergy     allergic rhinitis  . Psoriasis   . Neck pain, chronic     with fusion  . Arthritis   . Depression     no per pt  . COPD (chronic obstructive pulmonary disease) ([place])   . Hyperlipidemia     no meds  . Osteoporosis     Patient Active Problem List   Diagnosis Date Noted  . Viral gastroenteritis 01/02/2015  . Mild cognitive impairment 01/02/2015  . Left knee pain 11/25/2014  . Routine general medical examination at a health care facility 10/26/2011  . Gynecological examination 07/05/2010  .  DEPRESSION 05/03/2010  . NECK PAIN 06/10/2008  . HYPERLIPIDEMIA 02/26/2007  . TOBACCO ABUSE 02/26/2007  . PSORIASIS 02/26/2007    Past Surgical History  Procedure Laterality Date  . Spine surgery  1977    cervical fusion Dr Carloyn Manner x2  . Abdominal hysterectomy      endometriosis 1 ovary left  . Appendectomy      Current Outpatient Rx  Name  Route  Sig  Dispense  Refill  . acetaminophen ([facility] 8 HOUR PAIN RELIEF) 650 MG CR tablet   Oral   Take 1 capsule by mouth 2 (two) times daily as needed.         . diclofenac (VOLTAREN) 75 MG EC tablet   Oral   Take 1 tablet (75 mg total) by mouth 2 (two) times daily.   60 tablet   2   . diphenhydrAMINE (BENADRYL) 25 MG tablet   Oral   Take 1 tablet (25 mg total) by mouth every 6 (six) hours as needed.   15 tablet   0   . famotidine (PEPCID) 40 MG tablet   Oral   Take 1 tablet (40 mg total) by mouth every evening.   30 tablet   1   . predniSONE (DELTASONE) 20 MG tablet   Oral   Take 3 tablets (60 mg total) by mouth daily.   12 tablet   0   . rivastigmine (EXELON) 4.6 mg/24hr  Transdermal   Place 1 patch (4.6 mg total) onto the skin daily. Patient not taking: Reported on 01/21/2015   30 patch   11     Allergies Aricept; Aspirin; Atorvastatin; and Simvastatin  Family History  Problem Relation Age of Onset  . Hypertension Mother   . Diabetes Mother   . Stroke Mother 61  . Asthma Father   . COPD Father   . Colon cancer Neg Hx   . Esophageal cancer Neg Hx   . Ulcerative colitis Neg Hx   . Stomach cancer Neg Hx     Social History Social History  Substance Use Topics  . Smoking status: Current Every Day Smoker -- 0.50 packs/day  . Smokeless tobacco: Never Used  . Alcohol Use: No    Review of Systems Constitutional: No fever/chills Eyes: No visual changes. ENT: Hoarse voice, throat tightening Cardiovascular: Denies chest pain. Respiratory: Denies shortness of breath. Gastrointestinal: No abdominal pain.  No  nausea, no vomiting.  No diarrhea.  No constipation. Genitourinary: Negative for dysuria. Musculoskeletal: Negative for back pain. Skin: Itching, Negative for rash. Neurological: Negative for headaches, focal weakness or numbness.  10-point ROS otherwise negative.  ____________________________________________   PHYSICAL EXAM:  VITAL SIGNS: ED Triage Vitals  Enc Vitals Group     BP 04/29/15 0509 137/79 mmHg     Pulse Rate 04/29/15 0509 71     Resp 04/29/15 0509 18     Temp 04/29/15 0509 98.2 F (36.8 C)     Temp src --      SpO2 04/29/15 0509 96 %     Weight 04/29/15 0509 137 lb (62.143 kg)     Height 04/29/15 0509 5\' 2"  (1.575 m)     Head Cir --      Peak Flow --      Pain Score --      Pain Loc --      Pain Edu? --      Excl. in [place]? --     Constitutional: Alert and oriented. Well appearing and in no acute distress. Eyes: Conjunctivae are normal. PERRL. EOMI. Head: Atraumatic. Nose: No congestion/rhinnorhea. Mouth/Throat: Mucous membranes are moist.  Oropharynx non-erythematous. Cardiovascular: Normal rate, regular rhythm. Grossly normal heart sounds.  Good peripheral circulation. Respiratory: Normal respiratory effort.  No retractions. Lungs CTAB. Gastrointestinal: Soft and nontender. No distention. Positive bowel sounds Musculoskeletal: No lower extremity tenderness nor edema.   Neurologic:  Normal speech and language.  Skin:  Skin is warm, dry and intact.  Psychiatric: Mood and affect are normal.   ____________________________________________   LABS (all labs ordered are listed, but only abnormal results are displayed)  Labs Reviewed - No data to display ____________________________________________  EKG  None ____________________________________________  RADIOLOGY  None ____________________________________________   PROCEDURES  Procedure(s) performed: None  Critical Care performed: No  ____________________________________________   INITIAL  IMPRESSION / ASSESSMENT AND PLAN / ED COURSE  Pertinent labs & imaging results that were available during my care of the patient were reviewed by me and considered in my medical decision making (see chart for details).  This is a 65 year old female who comes into the hospital today with some itching and throat tightening with a concern for allergic reaction. I did give the patient some Solu-Medrol, Benadryl and Pepcid. I will monitor the patient for some hours and if her symptoms are continually improving I will discharge her to home. At this time the patient reports that her throat feels okay and her itching is improved.  I will reassess the patient.  The patient feels improved. She will be discharged home.  ____________________________________________   FINAL CLINICAL IMPRESSION(S) / ED DIAGNOSES  Final diagnoses:  Allergic reaction, initial encounter  Itching  Throat swelling      Loney Hering, MD 04/29/15 [phone]

## 2015-04-29 NOTE — ED Notes (Signed)
Pt took voltaren for first time yest around 2100 states at 0100 this am she is itching all over, throat feels like it is tight.

## 2015-04-29 NOTE — ED Notes (Signed)
Pt started taking new medication and woke up daughter in the middle of the night stating she was having a hard time breathing and swallowing.

## 2015-04-29 NOTE — Telephone Encounter (Signed)
Spoke with Roselyn Reef and appt changed to 04/30/15 at 11:30 with Dr Deborra Medina.

## 2015-04-29 NOTE — Telephone Encounter (Signed)
[facility]  Patient Name: Tammy Neal  DOB: 1950-09-09    Initial Comment caller states mother is coughing up mucus   Nurse Assessment  Nurse: Wayne Sever, RN, Tillie Rung Date/Time (Eastern Time): 04/29/2015 10:16:33 AM  Confirm and document reason for call. If symptomatic, describe symptoms. You must click the next button to save text entered. ---Caller gave me permission to talk to her daughter about her. They went to ER last night for allergic reaction to [facility], her throat was closing up. She got IV Benadryl, Prednisone and Pepcid. She wanted to let the MD know about allergic reaction. She has been coughing for a few weeks and she wants to know if the MD wants to see her or she can call in cough medication. She is coughing up clear phlegm. Denies any fever.  Has the patient traveled out of the country within the last 30 days? ---Not Applicable  Does the patient have any new or worsening symptoms? ---Yes  Will a triage be completed? ---Yes  Related visit to physician within the last 2 weeks? ---No  Does the PT have any chronic conditions? (i.e. diabetes, asthma, etc.) ---Yes  List chronic conditions. ---Rheumatoid, Arthritis, Emphysema,  Is this a behavioral health or substance abuse call? ---No     Guidelines    Guideline Title Affirmed Question Affirmed Notes  Cough - Acute Productive SEVERE coughing spells (e.g., whooping sound after coughing, vomiting after coughing)    Final Disposition User   See Physician within 24 Hours [place], RN, Tillie Rung    Comments  Scheduled on 03/09 at 0900am with Dr. Deborra Medina   Referrals  REFERRED TO PCP OFFICE   Disagree/Comply: Comply

## 2015-04-29 NOTE — Telephone Encounter (Signed)
[facility] scheduled Pt appt to see Dr Deborra Medina on 04/30/15 at 9 am.

## 2015-04-30 ENCOUNTER — Ambulatory Visit (INDEPENDENT_AMBULATORY_CARE_PROVIDER_SITE_OTHER): Payer: Medicare Other | Admitting: Family Medicine

## 2015-04-30 ENCOUNTER — Encounter: Payer: Self-pay | Admitting: Family Medicine

## 2015-04-30 ENCOUNTER — Ambulatory Visit: Payer: Self-pay | Admitting: Family Medicine

## 2015-04-30 VITALS — BP 120/64 | HR 68 | Temp 98.1°F | Wt 127.2 lb

## 2015-04-30 DIAGNOSIS — T7840XA Allergy, unspecified, initial encounter: Secondary | ICD-10-CM | POA: Insufficient documentation

## 2015-04-30 DIAGNOSIS — R05 Cough: Secondary | ICD-10-CM | POA: Diagnosis not present

## 2015-04-30 DIAGNOSIS — T7840XD Allergy, unspecified, subsequent encounter: Secondary | ICD-10-CM

## 2015-04-30 DIAGNOSIS — R059 Cough, unspecified: Secondary | ICD-10-CM | POA: Insufficient documentation

## 2015-04-30 MED ORDER — HYDROCOD POLST-CPM POLST ER 10-8 MG/5ML PO SUER: 5.0000 mL | Freq: Two times a day (BID) | ORAL | Status: DC | PRN

## 2015-04-30 NOTE — Assessment & Plan Note (Signed)
New- exam reassuring. Likely viral. Does seem to be improving. Given rx for tussionex to fill as needed for severe cough at night. Call or return to clinic prn if these symptoms worsen or fail to improve as anticipated. The patient indicates understanding of these issues and agrees with the plan.

## 2015-04-30 NOTE — Progress Notes (Signed)
Pre visit review using our clinic review tool, if applicable. No additional management support is needed unless otherwise documented below in the visit note. 

## 2015-04-30 NOTE — Assessment & Plan Note (Signed)
Resolved. Agreed that she does not need to start oral prednisone at this point. Voltaren added to allergy list. Call or return to clinic prn if these symptoms worsen or fail to improve as anticipated. The patient indicates understanding of these issues and agrees with the plan.

## 2015-04-30 NOTE — Progress Notes (Signed)
Subjective:   Patient ID: KATHLENA DALMAU, female    DOB: [birth year], 65 y.o.   MRN: [id number]  Janaa Topping Frew is a pleasant 65 y.o. year old female pt of Dr. Glori Bickers, new to me, who presents to clinic today with her daughter for Cough  on 04/30/2015  HPI:  Cough- over two weeks of productive (clear sputum) cough. No fevers or chills. No CP. She is a smoker.  Of note, was seen in ED yesterday for throat swelling and itching. Note reviewed.  Given Solu Medrol, Benadryl and Pepcid for allergic reaction to Voltaren.  Was also given an rx for prednsione, benadryl and pepcid but did not fill them because she has had so many allergic reactions to multiple rxs.  All of those symptoms have resolved.  Current Outpatient Prescriptions on File Prior to Visit  Medication Sig Dispense Refill  . acetaminophen ([facility] 8 HOUR PAIN RELIEF) 650 MG CR tablet Take 1 capsule by mouth 2 (two) times daily as needed.    . diclofenac (VOLTAREN) 75 MG EC tablet Take 1 tablet (75 mg total) by mouth 2 (two) times daily. 60 tablet 2  . diphenhydrAMINE (BENADRYL) 25 MG tablet Take 1 tablet (25 mg total) by mouth every 6 (six) hours as needed. 15 tablet 0  . famotidine (PEPCID) 40 MG tablet Take 1 tablet (40 mg total) by mouth every evening. 30 tablet 1  . predniSONE (DELTASONE) 20 MG tablet Take 3 tablets (60 mg total) by mouth daily. 12 tablet 0   No current facility-administered medications on file prior to visit.    Allergies  Allergen Reactions  . Aricept [Donepezil Hcl] Itching    Itchy, jittery (no hives)  . Aspirin     REACTION: GI upset  . Atorvastatin     REACTION: pain  . Simvastatin     REACTION: muscle pain    Past Medical History  Diagnosis Date  . Allergy     allergic rhinitis  . Psoriasis   . Neck pain, chronic     with fusion  . Arthritis   . Depression     no per pt  . COPD (chronic obstructive pulmonary disease) ([place])   . Hyperlipidemia     no meds  . Osteoporosis     Past  Surgical History  Procedure Laterality Date  . Spine surgery  1977    cervical fusion Dr Carloyn Manner x2  . Abdominal hysterectomy      endometriosis 1 ovary left  . Appendectomy      Family History  Problem Relation Age of Onset  . Hypertension Mother   . Diabetes Mother   . Stroke Mother 54  . Asthma Father   . COPD Father   . Colon cancer Neg Hx   . Esophageal cancer Neg Hx   . Ulcerative colitis Neg Hx   . Stomach cancer Neg Hx     Social History   Social History  . Marital Status: Single    Spouse Name: N/A  . Number of Children: N/A  . Years of Education: N/A   Occupational History  . Not on file.   Social History Main Topics  . Smoking status: Current Every Day Smoker -- 0.50 packs/day  . Smokeless tobacco: Never Used  . Alcohol Use: No  . Drug Use: No  . Sexual Activity: Not on file   Other Topics Concern  . Not on file   Social History Narrative  . The PMH, PSH, Social History,  Family History, Medications, and allergies have been reviewed in [facility], and have been updated if relevant.   Review of Systems  Constitutional: Negative.   HENT: Positive for congestion and rhinorrhea. Negative for ear discharge, ear pain, facial swelling and sinus pressure.   Respiratory: Positive for cough. Negative for shortness of breath and wheezing.   Cardiovascular: Negative.   Musculoskeletal: Negative.   Skin: Negative.   Allergic/Immunologic: Negative.   Neurological: Negative.   All other systems reviewed and are negative.      Objective:    BP 120/64 mmHg  Pulse 68  Temp(Src) 98.1 F (36.7 C) (Oral)  Wt 127 lb 4 oz (57.72 kg)  SpO2 97%   Physical Exam  Constitutional: She is oriented to person, place, and time. She appears well-developed and well-nourished. No distress.  HENT:  Head: Normocephalic and atraumatic.  Eyes: Conjunctivae are normal.  Cardiovascular: Normal rate and regular rhythm.   Pulmonary/Chest: Breath sounds normal. No respiratory distress.  She has no wheezes. She has no rales.  Musculoskeletal: Normal range of motion.  Neurological: She is alert and oriented to person, place, and time. No cranial nerve deficit.  Skin: Skin is warm and dry. She is not diaphoretic.  Psychiatric: She has a normal mood and affect. Her behavior is normal. Judgment and thought content normal.  Nursing note and vitals reviewed.         Assessment & Plan:   Cough  Allergic reaction, subsequent encounter No Follow-up on file.

## 2015-04-30 NOTE — Progress Notes (Deleted)
   Subjective:   Patient ID: Tammy Neal, female    DOB: 1950/10/24, 65 y.o.   MRN: [id number]  Tammy Neal is a pleasant 65 y.o. year old female who presents to clinic today with Cough  on 04/30/2015  HPI: ***  Review of Systems     Objective:    BP 120/64 mmHg  Pulse 68  Temp(Src) 98.1 F (36.7 C) (Oral)  Wt 127 lb 4 oz (57.72 kg)  SpO2 97%   Physical Exam        Assessment & Plan:   No diagnosis found. No Follow-up on file.

## 2015-05-20 ENCOUNTER — Telehealth: Payer: Self-pay

## 2015-05-20 NOTE — Telephone Encounter (Signed)
Daughter notified letter ready for pick up °

## 2015-05-20 NOTE — Telephone Encounter (Signed)
Tammy Neal pts daughter left v/m (DPR signed) left v/m that pt received jury summons for 07/14/2015; Tammy Neal request letter to excuse from jury duty due to mild cognitive impairment; pt cannot remember what she had for breakfast. Tammy Neal request cb when letter ready for pick up.

## 2015-05-20 NOTE — Telephone Encounter (Signed)
Letter done and in IN box 

## 2015-06-18 ENCOUNTER — Ambulatory Visit (INDEPENDENT_AMBULATORY_CARE_PROVIDER_SITE_OTHER): Payer: Medicare Other | Admitting: Family Medicine

## 2015-06-18 ENCOUNTER — Encounter: Payer: Self-pay | Admitting: Family Medicine

## 2015-06-18 VITALS — BP 120/78 | HR 62 | Temp 98.2°F | Ht 62.0 in | Wt 127.4 lb

## 2015-06-18 DIAGNOSIS — M1612 Unilateral primary osteoarthritis, left hip: Secondary | ICD-10-CM

## 2015-06-18 MED ORDER — METHYLPREDNISOLONE ACETATE 40 MG/ML IJ SUSP
80.0000 mg | Freq: Once | INTRAMUSCULAR | Status: AC
  Administered 2015-06-18: 80 mg via INTRA_ARTICULAR

## 2015-06-18 NOTE — Progress Notes (Signed)
Dr. Frederico Hamman T. Ysenia Filice, MD, [facility] [place], [address] Phone: [phone] Fax: [phone]  06/18/2015  Patient: Tammy Neal, MRN: [id number], DOB: [birth year], 65 y.o.  Primary Physician:  Loura Pardon, MD   Chief Complaint  Patient presents with  . Hip Pain    left    Subjective:   Tammy Neal is a 65 y.o. very pleasant female patient who presents with the following:  F/u L hip OA:  reinject left hip. I saw the patient about 6 months ago, and at that point we did an ultrasound guided intra-articular hip injection on the left. She had excellent relief of symptoms, and was able to function at a normal level up until a couple of weeks ago. She is only able to take Tylenol for pain. She actually had an anaphylactic reaction to oral diclofenac.   01/21/2015 Last OV with Owens Loffler, MD  F/u L hip: Dr. Glori Bickers asked me to f/u about her hip. Known moderate L hip OA.  Hip will hurt and get catches - all on the left side.  Stiff when waking up, keeps going all day long. Now sometimes using her cane.  She is here with her daughter.   We reviewed her hip films together again: She has at least moderate level osteoarthritic change in the left hip with some significant loss  Of her joint space, and she has a prominent inferior spur.  12/15/2014 Last OV with Owens Loffler, MD  Consulting MD: Dr. Glori Bickers Reason: L Hip pain  Pleasant patient who is been having some intermittent problems off and on for some time, but for the last few months she has been having quite a bit of hip and groin pain on the left, and this is radiating down her leg more on the lateral aspect.  She points also having some pain in the true groin.  Also within the last month or 2 she has been developing some left knee pain, and has some lateral pain and pain along the ITB.  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  Her primary complaint is her left hip.  Additional history is provided by her daughter today.  I reviewed all films with the patient face-to-face in the office today including both her knee and her hip films.  Left hip shows mild to moderate osteoarthritic change with relatively prominent bone spurs, notably inferiorly as well as joint space narrowing.  Relatively the right hip is more well preserved compared to the left.  Left knee is quite preserved with only a minimum amount of osteoarthritis on the [place] view in the medial compartment.  Overall minimal osteoarthritic change for age.  Past Medical History, Surgical History, Social History, Family History, Problem List, Medications, and Allergies have been reviewed and updated if relevant.  Patient Active Problem List   Diagnosis Date Noted  . Cough 04/30/2015  . Allergic reaction 04/30/2015  . Viral gastroenteritis 01/02/2015  . Mild cognitive impairment 01/02/2015  . Left knee pain 11/25/2014  . Routine general medical examination at a health care facility 10/26/2011  . Gynecological examination 07/05/2010  . DEPRESSION 05/03/2010  . NECK PAIN 06/10/2008  . HYPERLIPIDEMIA 02/26/2007  . TOBACCO ABUSE 02/26/2007  . PSORIASIS 02/26/2007    Past Medical History  Diagnosis Date  . Allergy     allergic rhinitis  .  Psoriasis   . Neck pain, chronic     with fusion  . Arthritis   . Depression     no per pt  . COPD (chronic obstructive pulmonary disease) ([place])   . Hyperlipidemia     no meds  . Osteoporosis     Past Surgical History  Procedure Laterality Date  . Spine surgery  1977    cervical fusion Dr Carloyn Manner x2  . Abdominal hysterectomy      endometriosis 1 ovary left  . Appendectomy      Social History   Social History  . Marital Status: Single    Spouse Name: N/A  . Number of Children: N/A  . Years of Education: N/A   Occupational History  . Not on file.   Social  History Main Topics  . Smoking status: Current Every Day Smoker -- 0.50 packs/day  . Smokeless tobacco: Never Used  . Alcohol Use: No  . Drug Use: No  . Sexual Activity: Not on file   Other Topics Concern  . Not on file   Social History Narrative    Family History  Problem Relation Age of Onset  . Hypertension Mother   . Diabetes Mother   . Stroke Mother 35  . Asthma Father   . COPD Father   . Colon cancer Neg Hx   . Esophageal cancer Neg Hx   . Ulcerative colitis Neg Hx   . Stomach cancer Neg Hx     Allergies  Allergen Reactions  . Voltaren [Diclofenac Sodium] Anaphylaxis  . Aricept [Donepezil Hcl] Itching    Itchy, jittery (no hives)  . Aspirin     REACTION: GI upset  . Atorvastatin     REACTION: pain  . Simvastatin     REACTION: muscle pain    Medication list reviewed and updated in full in [place].  GEN: No fevers, chills. Nontoxic. Primarily MSK c/o today. MSK: Detailed in the HPI GI: tolerating PO intake without difficulty Neuro: No numbness, parasthesias, or tingling associated. Otherwise, the pertinent positives and negatives are listed above and in the HPI, otherwise a full review of systems has been reviewed and is negative unless noted positive.   Objective:   BP 120/78 mmHg  Pulse 62  Temp(Src) 98.2 F (36.8 C) (Oral)  Ht 5\' 2"  (1.575 m)  Wt 127 lb 6.4 oz (57.788 kg)  BMI 23.30 kg/m2  SpO2 95%  GEN: WDWN, NAD, Non-toxic, A & O x 3 HEENT: Atraumatic, Normocephalic. Neck supple. No masses, No LAD. Ears and Nose: No external deformity. CV: RRR, No M/G/R. No JVD. No thrill. No extra heart sounds. PULM: CTA B, no wheezes, crackles, rhonchi. No retractions. No resp. distress. No accessory muscle use. EXTR: No c/c/e NEURO normal sensation and strength throughout. PSYCH: Normally interactive. Conversant. Not depressed or anxious appearing.  Calm demeanor.    HIP EXAM: SIDE: L ROM: Abduction, Flexion, Internal and External range of  motion: abduction is fairly limited.  The patient is only able to abduct about 30-35.  Approximate 15 loss of motion and internal and external range of motion, and these all do cause pain terminally Pain with terminal IROM and EROM: yes GTB: NT SLR: NEG Knees: No effusion FABER: NT REVERSE FABER: NT, neg Piriformis: NT at direct palpation Str: flexion: 5/5 abduction: 4+/5 adduction: 5/5 Strength testing non-tender  Radiology: Dg Hip Unilat With Pelvis 2-3 Views Left  12/15/2014  CLINICAL DATA:  Left hip pain for several months. EXAM: DG  HIP (WITH OR WITHOUT PELVIS) 2-3V LEFT COMPARISON:  None. FINDINGS: No fracture.  No bone lesion. There is mild concentric left hip joint space narrowing. Marginal osteophytes project from the femoral head, mostly inferiorly. There is bony productive change along the superior lateral acetabulum. There is mild sclerosis along the inferior margin of the left acetabulum and more subtly along the superior aspect. Bones are demineralized. SI joints and symphysis pubis are normally aligned. Soft tissues are unremarkable. IMPRESSION: 1. No fracture or acute finding.  No bone lesion. 2. Arthropathic changes of the left hip. There are more subtle arthropathic changes of the right hip. Electronically Signed   By: Lajean Manes M.D.   On: 12/15/2014 16:32   Assessment and Plan:   Primary osteoarthritis of left hip - Plan: methylPREDNISolone acetate (DEPO-MEDROL) injection 80 mg  Given anaphylaxis from diclofenac, I would not use oral or topical NSAIDS again.  Given response to prior hip injection, reinjection is reasonable for her.   Ultrasound guided hip injection. LEFT Verbal and written consent was obtained. Potential complications including infection, bleeding, anesthesia to nerve were all discussed with the patient, and he would like to proceed. Using ultrasound machine, the intra-articular space was located. The patient was then prepped with Chloraprep x 2. A  22-gauge spinal needle was visualized going to the joint capsule, and fluid was observed entering the joint capsule. The combination of 8 cc of lidocaine 1% and 2 mL of Depo-Medrol 40 mg, equaling Depo-Medrol 80 mg was injected into the intra-articular hip space. The patient tolerated the procedure well and had almost immediate improvement of his pain symptoms. No complications. Procedure was tolerated well.  Follow-up: prn  Signed,  Demeisha Geraghty T. Dezaria Methot, MD   Patient's Medications  New Prescriptions   No medications on file  Previous Medications   ACETAMINOPHEN ([facility]S 8 HOUR PAIN RELIEF) 650 MG CR TABLET    Take 1 capsule by mouth 2 (two) times daily as needed.  Modified Medications   No medications on file  Discontinued Medications   CHLORPHENIRAMINE-HYDROCODONE (TUSSIONEX PENNKINETIC ER) 10-8 MG/5ML SUER    Take 5 mLs by mouth every 12 (twelve) hours as needed.   DICLOFENAC (VOLTAREN) 75 MG EC TABLET    Take 1 tablet (75 mg total) by mouth 2 (two) times daily.   DIPHENHYDRAMINE (BENADRYL) 25 MG TABLET    Take 1 tablet (25 mg total) by mouth every 6 (six) hours as needed.   FAMOTIDINE (PEPCID) 40 MG TABLET    Take 1 tablet (40 mg total) by mouth every evening.   PREDNISONE (DELTASONE) 20 MG TABLET    Take 3 tablets (60 mg total) by mouth daily.

## 2015-06-18 NOTE — Progress Notes (Signed)
Pre visit review using our clinic review tool, if applicable. No additional management support is needed unless otherwise documented below in the visit note. 

## 2015-10-28 ENCOUNTER — Telehealth: Payer: Self-pay | Admitting: Family Medicine

## 2015-10-28 NOTE — Telephone Encounter (Signed)
I cannot px anything w/o examining her -would like to figure out what the cause is (esp in light of smoking hx) Robitussin or mucinex DM are helpful otc  If she has post nasal drip - then an antihistamine like claritin or allegra or zyrtec may help  Please make an appt when able

## 2015-10-28 NOTE — Telephone Encounter (Signed)
Daughter advise of Dr. Marliss Coots comments and pt was trying the OTC meds already so appt scheduled with Dr. Glori Bickers to eval pt

## 2015-10-28 NOTE — Telephone Encounter (Signed)
Pt daughter Roselyn Reef called. Would like rx cough med for pt. She has dry cough, she gets a tickle in her throat and cant stop coughing. Coughs more during night, No fever. Please advise  [phone]

## 2015-10-30 ENCOUNTER — Ambulatory Visit (INDEPENDENT_AMBULATORY_CARE_PROVIDER_SITE_OTHER): Payer: Medicare Other | Admitting: Family Medicine

## 2015-10-30 ENCOUNTER — Encounter: Payer: Self-pay | Admitting: Family Medicine

## 2015-10-30 ENCOUNTER — Ambulatory Visit (INDEPENDENT_AMBULATORY_CARE_PROVIDER_SITE_OTHER)
Admission: RE | Admit: 2015-10-30 | Discharge: 2015-10-30 | Disposition: A | Discharge status: Home / Self Care | Payer: Medicare Other | Source: Ambulatory Visit | Attending: Family Medicine | Admitting: Family Medicine

## 2015-10-30 VITALS — BP 134/82 | HR 73 | Temp 98.3°F | Ht 62.0 in | Wt 127.5 lb

## 2015-10-30 DIAGNOSIS — R059 Cough, unspecified: Secondary | ICD-10-CM

## 2015-10-30 DIAGNOSIS — F17200 Nicotine dependence, unspecified, uncomplicated: Secondary | ICD-10-CM | POA: Diagnosis not present

## 2015-10-30 DIAGNOSIS — Z23 Encounter for immunization: Secondary | ICD-10-CM | POA: Diagnosis not present

## 2015-10-30 DIAGNOSIS — R05 Cough: Secondary | ICD-10-CM | POA: Diagnosis not present

## 2015-10-30 MED ORDER — PNEUMOCOCCAL 13-VAL CONJ VACC IM SUSP
0.5000 mL | Freq: Once | INTRAMUSCULAR | Status: AC
  Administered 2015-10-30: 0.5 mL via INTRAMUSCULAR

## 2015-10-30 MED ORDER — BENZONATATE 200 MG PO CAPS: 200.0000 mg | ORAL_CAPSULE | Freq: Three times a day (TID) | ORAL | 5 refills | Status: DC | PRN

## 2015-10-30 NOTE — Patient Instructions (Signed)
Flu shot today  prevnar vaccine today  Chest xray today  Try the tessalon as needed for cough  Keep thinking about quitting smoking

## 2015-10-30 NOTE — Progress Notes (Signed)
Subjective:    Patient ID: Tammy Neal, female    DOB: 09-Nov-1950, 65 y.o.   MRN: [id number]  HPI Here for an ongoing cough - calls it her "smoker's cough"  Has had a cough for a while This past weekend -some bad coughing spells - especially at night (she feels like she chokes when she coughs hard ever since her neck surgery) - tends to get panicky  That scared her - she is afraid to lie down or fall asleep   Starts as a tickle in her throat  Some post nasal drip more thick than usual - but it is clear  Since last weekend- cough has been more dry  A little nasal congestion   No colored nasal congestion  No facial pain or pressure  Ears are ok   No wheezing  No shortness of breath   Wt Readings from Last 3 Encounters:  10/30/15 127 lb 8 oz (57.8 kg)  06/18/15 127 lb 6.4 oz (57.8 kg)  04/30/15 127 lb 4 oz (57.7 kg)   bmi is 23 Wt in march was 37 lb - lost 10 lb in a month then leveled off   Has smoked since she was in her 80s  Is not ready to to quit yet  Has cut back 1/2 ppd   Patient Active Problem List   Diagnosis Date Noted  . Cough 04/30/2015  . Allergic reaction 04/30/2015  . Viral gastroenteritis 01/02/2015  . Mild cognitive impairment 01/02/2015  . Routine general medical examination at a health care facility 10/26/2011  . Gynecological examination 07/05/2010  . DEPRESSION 05/03/2010  . NECK PAIN 06/10/2008  . HYPERLIPIDEMIA 02/26/2007  . TOBACCO ABUSE 02/26/2007  . PSORIASIS 02/26/2007   Past Medical History:  Diagnosis Date  . Allergy    allergic rhinitis  . Arthritis   . COPD (chronic obstructive pulmonary disease) ([place])   . Depression    no per pt  . Hyperlipidemia    no meds  . Neck pain, chronic    with fusion  . Osteoporosis   . Psoriasis    Past Surgical History:  Procedure Laterality Date  . ABDOMINAL HYSTERECTOMY     endometriosis 1 ovary left  . APPENDECTOMY    . SPINE SURGERY  1977   cervical fusion Dr Carloyn Manner x2   Social  History  Substance Use Topics  . Smoking status: Current Every Day Smoker    Packs/day: 0.50  . Smokeless tobacco: Never Used  . Alcohol use No   Family History  Problem Relation Age of Onset  . Hypertension Mother   . Diabetes Mother   . Stroke Mother 22  . Asthma Father   . COPD Father   . Colon cancer Neg Hx   . Esophageal cancer Neg Hx   . Ulcerative colitis Neg Hx   . Stomach cancer Neg Hx    Allergies  Allergen Reactions  . Voltaren [Diclofenac Sodium] Anaphylaxis  . Aricept [Donepezil Hcl] Itching    Itchy, jittery (no hives)  . Aspirin     REACTION: GI upset  . Atorvastatin     REACTION: pain  . Simvastatin     REACTION: muscle pain   Current Outpatient Prescriptions on File Prior to Visit  Medication Sig Dispense Refill  . acetaminophen ([facility]S 8 HOUR PAIN RELIEF) 650 MG CR tablet Take 1 capsule by mouth 2 (two) times daily as needed.     No current facility-administered medications on file prior to visit.  Review of Systems Review of Systems  Constitutional: Negative for fever, appetite change,  and unexpected weight change.  Eyes: Negative for pain and visual disturbance.  ENT pos for mild pnd / neg for sinus pain  Respiratory: Negative for wheeze  and shortness of breath.  pos for cough/ neg for hemoptysis Cardiovascular: Negative for cp or palpitations    Gastrointestinal: Negative for nausea, diarrhea and constipation.  Genitourinary: Negative for urgency and frequency.  Skin: Negative for pallor or rash   Neurological: Negative for weakness, light-headedness, numbness and headaches.  Hematological: Negative for adenopathy. Does not bruise/bleed easily.  Psychiatric/Behavioral: Negative for dysphoric mood. The patient is not nervous/anxious.         Objective:   Physical Exam  Constitutional: She appears well-developed and well-nourished. No distress.  Well appearing   HENT:  Head: Normocephalic and atraumatic.  Mouth/Throat: Oropharynx is  clear and moist.  Eyes: Conjunctivae and EOM are normal. Pupils are equal, round, and reactive to light.  Neck: Normal range of motion. Neck supple. No JVD present. Carotid bruit is not present. No thyromegaly present.  Cardiovascular: Normal rate, regular rhythm, normal heart sounds and intact distal pulses.  Exam reveals no gallop.   Pulmonary/Chest: Effort normal and breath sounds normal. No respiratory distress. She has no wheezes. She has no rales. She exhibits no tenderness.  No crackles  Diffusely distant bs  No wheeze or crackles  Overall fairly good air exch  Abdominal: Soft. Bowel sounds are normal. She exhibits no distension, no abdominal bruit and no mass. There is no tenderness.  Musculoskeletal: She exhibits no edema.  Lymphadenopathy:    She has no cervical adenopathy.  Neurological: She is alert. She has normal reflexes.  Skin: Skin is warm and dry. No rash noted.  Psychiatric: She has a normal mood and affect.  Timid /quiet= family answers some questions for her           Assessment & Plan:   Problem List Items Addressed This Visit      Other   TOBACCO ABUSE    Disc in detail risks of smoking and possible outcomes including copd, vascular/ heart disease, cancer , respiratory and sinus infections  Pt voices understanding Pt has no symptoms of copd but does have a smoker's cough  She is not interested in quitting at this time but has cut back       Relevant Orders   DG Chest 2 View (Completed)   Cough - Primary    Suspect smokers cough/ could also be related to post nasal drip  Flu shot and prevnar today  cxr today  She is not interested in smoking cessation Reassuring exam Px tessalon for cough prn  Flu shot today  prevnar vaccine today  Chest xray today  Try the tessalon as needed for cough  Keep thinking about quitting smoking        Relevant Orders   DG Chest 2 View (Completed)    Other Visit Diagnoses    Need for vaccination with  13-polyvalent pneumococcal conjugate vaccine       Relevant Medications   pneumococcal 13-valent conjugate vaccine (PREVNAR 13) injection 0.5 mL (Completed)   Need for influenza vaccination       Relevant Orders   Flu Vaccine QUAD 36+ mos IM (Completed)

## 2015-10-30 NOTE — Progress Notes (Signed)
Pre visit review using our clinic review tool, if applicable. No additional management support is needed unless otherwise documented below in the visit note. 

## 2015-11-01 NOTE — Assessment & Plan Note (Signed)
Suspect smokers cough/ could also be related to post nasal drip  Flu shot and prevnar today  cxr today  She is not interested in smoking cessation Reassuring exam Px tessalon for cough prn  Flu shot today  prevnar vaccine today  Chest xray today  Try the tessalon as needed for cough  Keep thinking about quitting smoking

## 2015-11-01 NOTE — Assessment & Plan Note (Signed)
Disc in detail risks of smoking and possible outcomes including copd, vascular/ heart disease, cancer , respiratory and sinus infections  Pt voices understanding Pt has no symptoms of copd but does have a smoker's cough  She is not interested in quitting at this time but has cut back

## 2015-12-20 ENCOUNTER — Telehealth: Payer: Self-pay | Admitting: Family Medicine

## 2015-12-20 DIAGNOSIS — G3184 Mild cognitive impairment, so stated: Secondary | ICD-10-CM

## 2015-12-20 DIAGNOSIS — Z0000 Encounter for general adult medical examination without abnormal findings: Secondary | ICD-10-CM

## 2015-12-20 NOTE — Telephone Encounter (Signed)
-----   Message from Marchia Bond sent at 12/18/2015 11:07 AM EDT ----- Regarding: Cpx labs Tues 10/31, need orders. Thanks! :-) Please order  future cpx labs for pt's upcoming lab appt. Thanks Aniceto Boss

## 2015-12-22 ENCOUNTER — Other Ambulatory Visit (INDEPENDENT_AMBULATORY_CARE_PROVIDER_SITE_OTHER): Payer: Medicare Other

## 2015-12-22 DIAGNOSIS — G3184 Mild cognitive impairment, so stated: Secondary | ICD-10-CM | POA: Diagnosis not present

## 2015-12-22 DIAGNOSIS — Z0000 Encounter for general adult medical examination without abnormal findings: Secondary | ICD-10-CM

## 2015-12-22 LAB — CBC WITH DIFFERENTIAL/PLATELET
BASOS ABS: 0 10*3/uL (ref 0.0–0.1)
Basophils Relative: 0.5 % (ref 0.0–3.0)
EOS ABS: 0.2 10*3/uL (ref 0.0–0.7)
Eosinophils Relative: 2.4 % (ref 0.0–5.0)
HEMATOCRIT: 47.4 % — AB (ref 36.0–46.0)
HEMOGLOBIN: 16.1 g/dL — AB (ref 12.0–15.0)
LYMPHS PCT: 26.2 % (ref 12.0–46.0)
Lymphs Abs: 2 10*3/uL (ref 0.7–4.0)
MCHC: 34 g/dL (ref 30.0–36.0)
MCV: 92.7 fl (ref 78.0–100.0)
MONO ABS: 0.7 10*3/uL (ref 0.1–1.0)
Monocytes Relative: 9.2 % (ref 3.0–12.0)
Neutro Abs: 4.7 10*3/uL (ref 1.4–7.7)
Neutrophils Relative %: 61.7 % (ref 43.0–77.0)
PLATELETS: 248 10*3/uL (ref 150.0–400.0)
RBC: 5.11 Mil/uL (ref 3.87–5.11)
RDW: 13.3 % (ref 11.5–15.5)
WBC: 7.6 10*3/uL (ref 4.0–10.5)

## 2015-12-22 LAB — LIPID PANEL
Cholesterol: 223 mg/dL — ABNORMAL HIGH (ref 0–200)
HDL: 44.1 mg/dL (ref >39.00)
NONHDL: 179.16
TRIGLYCERIDES: 217 mg/dL — AB (ref 0.0–149.0)
Total CHOL/HDL Ratio: 5
VLDL: 43.4 mg/dL — ABNORMAL HIGH (ref 0.0–40.0)

## 2015-12-22 LAB — COMPREHENSIVE METABOLIC PANEL
ALBUMIN: 4.1 g/dL (ref 3.5–5.2)
ALT: 11 U/L (ref 0–35)
AST: 16 U/L (ref 0–37)
Alkaline Phosphatase: 79 U/L (ref 39–117)
BILIRUBIN TOTAL: 0.4 mg/dL (ref 0.2–1.2)
BUN: 11 mg/dL (ref 6–23)
CALCIUM: 9.6 mg/dL (ref 8.4–10.5)
CO2: 28 meq/L (ref 19–32)
CREATININE: 0.76 mg/dL (ref 0.40–1.20)
Chloride: 107 mEq/L (ref 96–112)
GFR: 80.82 mL/min (ref >60.00)
Glucose, Bld: 94 mg/dL (ref 70–99)
Potassium: 4.7 mEq/L (ref 3.5–5.1)
SODIUM: 143 meq/L (ref 135–145)
Total Protein: 6.8 g/dL (ref 6.0–8.3)

## 2015-12-22 LAB — VITAMIN B12: VITAMIN B 12: 223 pg/mL (ref 211–911)

## 2015-12-22 LAB — LDL CHOLESTEROL, DIRECT: LDL DIRECT: 138 mg/dL

## 2015-12-22 LAB — TSH: TSH: 1.9 u[IU]/mL (ref 0.35–4.50)

## 2015-12-29 ENCOUNTER — Encounter: Payer: Self-pay | Admitting: Family Medicine

## 2015-12-29 ENCOUNTER — Ambulatory Visit (INDEPENDENT_AMBULATORY_CARE_PROVIDER_SITE_OTHER): Payer: Medicare Other | Admitting: Family Medicine

## 2015-12-29 VITALS — BP 124/78 | HR 67 | Temp 98.1°F | Ht 59.5 in | Wt 122.2 lb

## 2015-12-29 DIAGNOSIS — G3184 Mild cognitive impairment, so stated: Secondary | ICD-10-CM

## 2015-12-29 DIAGNOSIS — F17200 Nicotine dependence, unspecified, uncomplicated: Secondary | ICD-10-CM | POA: Diagnosis not present

## 2015-12-29 DIAGNOSIS — E538 Deficiency of other specified B group vitamins: Secondary | ICD-10-CM

## 2015-12-29 DIAGNOSIS — R059 Cough, unspecified: Secondary | ICD-10-CM

## 2015-12-29 DIAGNOSIS — E7800 Pure hypercholesterolemia, unspecified: Secondary | ICD-10-CM

## 2015-12-29 DIAGNOSIS — Z0001 Encounter for general adult medical examination with abnormal findings: Secondary | ICD-10-CM

## 2015-12-29 DIAGNOSIS — R05 Cough: Secondary | ICD-10-CM

## 2015-12-29 DIAGNOSIS — E2839 Other primary ovarian failure: Secondary | ICD-10-CM | POA: Insufficient documentation

## 2015-12-29 DIAGNOSIS — Z0000 Encounter for general adult medical examination without abnormal findings: Secondary | ICD-10-CM

## 2015-12-29 MED ORDER — CYANOCOBALAMIN 1000 MCG/ML IJ SOLN
1000.0000 ug | Freq: Once | INTRAMUSCULAR | Status: AC
  Administered 2015-12-29: 1000 ug via INTRAMUSCULAR

## 2015-12-29 NOTE — Progress Notes (Signed)
Pre visit review using our clinic review tool, if applicable. No additional management support is needed unless otherwise documented below in the visit note. 

## 2015-12-29 NOTE — Progress Notes (Signed)
Subjective:    Patient ID: Tammy Neal, female    DOB: 11-08-1950, 65 y.o.   MRN: [id number]  HPI  Here for health maintenance exam and to review chronic medical problems    Feeling ok  Her cough is improved - cutting back on smoking  Takes care of her great grandson and nephew (31 and 2 yo) - they play well together  Daughter lives with her and they cook   Has not yet had her AMW visit  Wt Readings from Last 3 Encounters:  12/29/15 122 lb 4 oz (55.5 kg)  10/30/15 127 lb 8 oz (57.8 kg)  06/18/15 127 lb 6.4 oz (57.8 kg)  she eats well overall - good balanced meals and good exercise / gets outdoors at least daily  bmi is 24.2  Hep C screening -low risk /declines   Mammogram 6/15-normal -is due for one, she goes to the breast center Self breast exam-no lumps   Tetanus shot 3/07 -will look into getting it at a pharmacy   PNV had prevnar 9/17-will need PNA 23 in a year   Colonoscopy 8/15-polyp -up to date on screening / ? 5 year recall   dexa 12/09- normal range High risk of OP -smoking No falls or fractures  No OP in family that she knows of    Zoster vaccine 5/12  Flu shot 9/17  Pap nl 3/07-had a hysterectomy for endometriosis  No gyn symptoms  No bleeding or d/c    Smoking status - cut back to 1/2 ppd now  Unsure if she will get off cigarettes entirely  It helps to keep very busy    Hx of MCI- she thinks it is about the same at this time  Does a lot of socializing and is around people all the time- also in the [employer]  Really likes work puzzles- loves coloring books  aricept made her itchy and jittery Mood- coloring books are therapeutic for her -does not feel depressed   Hx of hyperlipidemia Lab Results  Component Value Date   CHOL 223 (H) 12/22/2015   CHOL 242 (H) 10/27/2011   CHOL 235 (H) 04/30/2010   Lab Results  Component Value Date   HDL 44.10 12/22/2015   HDL 37.80 (L) 10/27/2011   HDL 35.10 (L) 04/30/2010   No results found for:  [facility] Lab Results  Component Value Date   TRIG 217.0 (H) 12/22/2015   TRIG 315.0 (H) 10/27/2011   TRIG 318.0 (H) 04/30/2010   Lab Results  Component Value Date   CHOLHDL 5 12/22/2015   CHOLHDL 6 10/27/2011   CHOLHDL 7 04/30/2010   Lab Results  Component Value Date   LDLDIRECT 138.0 12/22/2015   LDLDIRECT 148.4 10/27/2011   LDLDIRECT 136.5 04/30/2010   a little improved  Intol of statins  She stays away from fatty and greasy foods     Results for orders placed or performed in visit on 12/22/15  CBC with Differential/Platelet  Result Value Ref Range   WBC 7.6 4.0 - 10.5 K/uL   RBC 5.11 3.87 - 5.11 Mil/uL   Hemoglobin 16.1 (H) 12.0 - 15.0 g/dL   HCT 47.4 (H) 36.0 - 46.0 %   MCV 92.7 78.0 - 100.0 fl   MCHC 34.0 30.0 - 36.0 g/dL   RDW 13.3 11.5 - 15.5 %   Platelets 248.0 150.0 - 400.0 K/uL   Neutrophils Relative % 61.7 43.0 - 77.0 %   Lymphocytes Relative 26.2 12.0 - 46.0 %  Monocytes Relative 9.2 3.0 - 12.0 %   Eosinophils Relative 2.4 0.0 - 5.0 %   Basophils Relative 0.5 0.0 - 3.0 %   Neutro Abs 4.7 1.4 - 7.7 K/uL   Lymphs Abs 2.0 0.7 - 4.0 K/uL   Monocytes Absolute 0.7 0.1 - 1.0 K/uL   Eosinophils Absolute 0.2 0.0 - 0.7 K/uL   Basophils Absolute 0.0 0.0 - 0.1 K/uL  Comprehensive metabolic panel  Result Value Ref Range   Sodium 143 135 - 145 mEq/L   Potassium 4.7 3.5 - 5.1 mEq/L   Chloride 107 96 - 112 mEq/L   CO2 28 19 - 32 mEq/L   Glucose, Bld 94 70 - 99 mg/dL   BUN 11 6 - 23 mg/dL   Creatinine, Ser 0.76 0.40 - 1.20 mg/dL   Total Bilirubin 0.4 0.2 - 1.2 mg/dL   Alkaline Phosphatase 79 39 - 117 U/L   AST 16 0 - 37 U/L   ALT 11 0 - 35 U/L   Total Protein 6.8 6.0 - 8.3 g/dL   Albumin 4.1 3.5 - 5.2 g/dL   Calcium 9.6 8.4 - 10.5 mg/dL   GFR 80.82 >60.00 mL/min  Lipid panel  Result Value Ref Range   Cholesterol 223 (H) 0 - 200 mg/dL   Triglycerides 217.0 (H) 0.0 - 149.0 mg/dL   HDL 44.10 >39.00 mg/dL   VLDL 43.4 (H) 0.0 - 40.0 mg/dL   Total CHOL/HDL  Ratio 5    NonHDL 179.16   TSH  Result Value Ref Range   TSH 1.90 0.35 - 4.50 uIU/mL  Vitamin B12  Result Value Ref Range   Vitamin B-12 223 211 - 911 pg/mL  LDL cholesterol, direct  Result Value Ref Range   Direct LDL 138.0 mg/dL     Patient Active Problem List   Diagnosis Date Noted  . Vitamin B12 deficiency 12/29/2015  . Estrogen deficiency 12/29/2015  . Cough 04/30/2015  . Mild cognitive impairment 01/02/2015  . Routine general medical examination at a health care facility 10/26/2011  . Gynecological examination 07/05/2010  . DEPRESSION 05/03/2010  . Hyperlipidemia 02/26/2007  . TOBACCO ABUSE 02/26/2007  . PSORIASIS 02/26/2007   Past Medical History:  Diagnosis Date  . Allergy    allergic rhinitis  . Arthritis   . COPD (chronic obstructive pulmonary disease) (Micro)   . Depression    no per pt  . Hyperlipidemia    no meds  . Neck pain, chronic    with fusion  . Osteoporosis   . Psoriasis    Past Surgical History:  Procedure Laterality Date  . ABDOMINAL HYSTERECTOMY     endometriosis 1 ovary left  . APPENDECTOMY    . SPINE SURGERY  1977   cervical fusion Dr Carloyn Manner x2   Social History  Substance Use Topics  . Smoking status: Current Every Day Smoker    Packs/day: 0.50  . Smokeless tobacco: Never Used  . Alcohol use No   Family History  Problem Relation Age of Onset  . Hypertension Mother   . Diabetes Mother   . Stroke Mother 11  . Asthma Father   . COPD Father   . Colon cancer Neg Hx   . Esophageal cancer Neg Hx   . Ulcerative colitis Neg Hx   . Stomach cancer Neg Hx    Allergies  Allergen Reactions  . Voltaren [Diclofenac Sodium] Anaphylaxis  . Aricept [Donepezil Hcl] Itching    Itchy, jittery (no hives)  . Aspirin  REACTION: GI upset  . Atorvastatin     REACTION: pain  . Simvastatin     REACTION: muscle pain   Current Outpatient Prescriptions on File Prior to Visit  Medication Sig Dispense Refill  . acetaminophen ([facility] 8 HOUR PAIN  RELIEF) 650 MG CR tablet Take 1 capsule by mouth 2 (two) times daily as needed.     No current facility-administered medications on file prior to visit.      Review of Systems Review of Systems  Constitutional: Negative for fever, appetite change, fatigue and unexpected weight change.  Eyes: Negative for pain and visual disturbance.  Respiratory: Negative for wheeze  and shortness of breath.  Pos for cough that is improved Cardiovascular: Negative for cp or palpitations    Gastrointestinal: Negative for nausea, diarrhea and constipation.  Genitourinary: Negative for urgency and frequency.  Skin: Negative for pallor or rash   Neurological: Negative for weakness, light-headedness, numbness and headaches.  Hematological: Negative for adenopathy. Does not bruise/bleed easily.  Psychiatric/Behavioral: Negative for dysphoric mood. The patient is not nervous/anxious.         Objective:   Physical Exam  Constitutional: She appears well-developed and well-nourished. No distress.  Well appearing   HENT:  Head: Normocephalic and atraumatic.  Right Ear: External ear normal.  Left Ear: External ear normal.  Mouth/Throat: Oropharynx is clear and moist.  Eyes: Conjunctivae and EOM are normal. Pupils are equal, round, and reactive to light. No scleral icterus.  Neck: Normal range of motion. Neck supple. No JVD present. Carotid bruit is not present. No thyromegaly present.  Cardiovascular: Normal rate, regular rhythm, normal heart sounds and intact distal pulses.  Exam reveals no gallop.   Pulmonary/Chest: Effort normal and breath sounds normal. No respiratory distress. She has no wheezes. She exhibits no tenderness.  bs are mildly distant  Abdominal: Soft. Bowel sounds are normal. She exhibits no distension, no abdominal bruit and no mass. There is no tenderness.  Genitourinary: No breast swelling, tenderness, discharge or bleeding.  Genitourinary Comments: Breast exam: No mass, nodules,  thickening, tenderness, bulging, retraction, inflamation, nipple discharge or skin changes noted.  No axillary or clavicular LA.      Musculoskeletal: Normal range of motion. She exhibits no edema or tenderness.  No kyphosis  Lymphadenopathy:    She has no cervical adenopathy.  Neurological: She is alert. She has normal reflexes. No cranial nerve deficit. She exhibits normal muscle tone. Coordination normal.  Skin: Skin is warm and dry. No rash noted. No erythema. No pallor.  Many lentigines on trunk and ext Also angiomas Few SKs  Small area of scale L knee- hx of psoriasis  Psychiatric: She has a normal mood and affect.  Fairly mentally sharp today-no evident memory problems           Assessment & Plan:   Problem List Items Addressed This Visit      Nervous and Auditory   Mild cognitive impairment    This is stable Intolerant of low dose aricept  Stays social and active/ does puzzles and coloring  Mood is stable as well         Other   Cough    Improved Cut back smoking Rev cxr       Estrogen deficiency    Ref for screening dexa Smoker No falls or fx       Relevant Orders   DG Bone Density   Hyperlipidemia    Disc goals for lipids and reasons to control them  Rev labs with pt Rev low sat fat diet in detail Mildly improved Does not tolerate statins Stressed imp of exercise and also smoking cessation to avoid vascular dz       Routine general medical examination at a health care facility - Primary    Reviewed health habits including diet and exercise and skin cancer prevention Reviewed appropriate screening tests for age  Also reviewed health mt list, fam hx and immunization status , as well as social and family history   See HPI Will schedule AMW visit Labs reviewed  Don't forget to make your mammogram appt. At the breast center  Also take a look at the handout regarding tetanus shots and see if you can get one at a pharmacy or health dept  Try to get  1200-1500 mg of calcium per day with at least 1000 iu of vitamin D - for bone health  Keep cutting back on smoking- hopefully you will be ready to quit one day   Your B12 level is in the low normal range  Eat a balanced diet Buy a B12 vitamin 1000 mcg and take 1 daily  Today - a B12 shot  B12 helps your memory   Stop at check out to schedule a bone density test and also medicare (AMW) interview with the nurse       TOBACCO ABUSE    Disc in detail risks of smoking and possible outcomes including copd, vascular/ heart disease, cancer , respiratory and sinus infections  Pt voices understanding Has cut down but not ready to quit       Vitamin B12 deficiency    Lab Results  Component Value Date   VITAMINB12 223 12/22/2015   This is in the low nl range in pt with memory loss  Will give a B12 shot today Then begin vit B12 otc 1000 mcg daily      Relevant Medications   cyanocobalamin ((VITAMIN B-12)) injection 1,000 mcg (Completed)

## 2015-12-29 NOTE — Patient Instructions (Addendum)
Don't forget to make your mammogram appt. At the breast center  Also take a look at the handout regarding tetanus shots and see if you can get one at a pharmacy or health dept  Try to get 1200-1500 mg of calcium per day with at least 1000 iu of vitamin D - for bone health  Keep cutting back on smoking- hopefully you will be ready to quit one day   Your B12 level is in the low normal range  Eat a balanced diet Buy a B12 vitamin 1000 mcg and take 1 daily  Today - a B12 shot  B12 helps your memory   Stop at check out to schedule a bone density test and also medicare (AMW) interview with the nurse

## 2015-12-30 ENCOUNTER — Other Ambulatory Visit: Payer: Self-pay | Admitting: Family Medicine

## 2015-12-30 DIAGNOSIS — Z1231 Encounter for screening mammogram for malignant neoplasm of breast: Secondary | ICD-10-CM

## 2015-12-30 NOTE — Assessment & Plan Note (Signed)
Disc goals for lipids and reasons to control them Rev labs with pt Rev low sat fat diet in detail Mildly improved Does not tolerate statins Stressed imp of exercise and also smoking cessation to avoid vascular dz

## 2015-12-30 NOTE — Assessment & Plan Note (Signed)
Ref for screening dexa Smoker No falls or fx

## 2015-12-30 NOTE — Assessment & Plan Note (Signed)
Improved Cut back smoking Rev cxr

## 2015-12-30 NOTE — Assessment & Plan Note (Signed)
This is stable Intolerant of low dose aricept  Stays social and active/ does puzzles and coloring  Mood is stable as well

## 2015-12-30 NOTE — Assessment & Plan Note (Signed)
Lab Results  Component Value Date   [id number] 12/22/2015   This is in the low nl range in pt with memory loss  Will give a B12 shot today Then begin vit B12 otc 1000 mcg daily

## 2015-12-30 NOTE — Assessment & Plan Note (Signed)
Disc in detail risks of smoking and possible outcomes including copd, vascular/ heart disease, cancer , respiratory and sinus infections  Pt voices understanding Has cut down but not ready to quit

## 2015-12-30 NOTE — Assessment & Plan Note (Signed)
Reviewed health habits including diet and exercise and skin cancer prevention Reviewed appropriate screening tests for age  Also reviewed health mt list, fam hx and immunization status , as well as social and family history   See HPI Will schedule AMW visit Labs reviewed  Don't forget to make your mammogram appt. At the breast center  Also take a look at the handout regarding tetanus shots and see if you can get one at a pharmacy or health dept  Try to get 1200-1500 mg of calcium per day with at least 1000 iu of vitamin D - for bone health  Keep cutting back on smoking- hopefully you will be ready to quit one day   Your B12 level is in the low normal range  Eat a balanced diet Buy a B12 vitamin 1000 mcg and take 1 daily  Today - a B12 shot  B12 helps your memory   Stop at check out to schedule a bone density test and also medicare (AMW) interview with the nurse

## 2016-01-07 ENCOUNTER — Ambulatory Visit
Admission: RE | Admit: 2016-01-07 | Discharge: 2016-01-07 | Disposition: A | Discharge status: Home / Self Care | Payer: Medicare Other | Source: Ambulatory Visit | Attending: Family Medicine | Admitting: Family Medicine

## 2016-01-07 DIAGNOSIS — Z13820 Encounter for screening for osteoporosis: Secondary | ICD-10-CM | POA: Diagnosis not present

## 2016-01-07 DIAGNOSIS — E2839 Other primary ovarian failure: Secondary | ICD-10-CM

## 2016-01-07 DIAGNOSIS — Z1231 Encounter for screening mammogram for malignant neoplasm of breast: Secondary | ICD-10-CM

## 2016-01-07 DIAGNOSIS — Z780 Asymptomatic menopausal state: Secondary | ICD-10-CM | POA: Diagnosis not present

## 2016-01-13 ENCOUNTER — Ambulatory Visit (INDEPENDENT_AMBULATORY_CARE_PROVIDER_SITE_OTHER): Payer: Medicare Other

## 2016-01-13 VITALS — BP 120/70 | HR 70 | Temp 98.7°F | Ht 60.0 in | Wt 122.8 lb

## 2016-01-13 DIAGNOSIS — Z1159 Encounter for screening for other viral diseases: Secondary | ICD-10-CM

## 2016-01-13 DIAGNOSIS — Z0000 Encounter for general adult medical examination without abnormal findings: Secondary | ICD-10-CM | POA: Diagnosis not present

## 2016-01-13 NOTE — Progress Notes (Signed)
PCP notes:   Health maintenance:  Hep C screening - completed  Abnormal screenings:   Hearing - failed Mini-Cog score: 15/20  Patient concerns:   None  Nurse concerns:  None  Next PCP appt:   N/A; pt already had CPE  I reviewed health advisor's note, was available for consultation, and agree with documentation and plan. Loura Pardon MD

## 2016-01-13 NOTE — Patient Instructions (Signed)
[name] , Thank you for taking time to come for your Medicare Wellness Visit. I appreciate your ongoing commitment to your health goals. Please review the following plan we discussed and let me know if I can assist you in the future.   These are the goals we discussed: Goals    . Increase water intake          Starting 01/13/2016, I will attempt to drink at least 8 oz of water with each meal daily.        This is a list of the screening recommended for you and due dates:  Health Maintenance  Topic Date Due  . Tetanus Vaccine  04/24/2025*  . Pneumonia vaccines (2 of 2 - PPSV23) 10/29/2016  . Mammogram  01/06/2017  . Colon Cancer Screening  09/28/2018  . Flu Shot  Addressed  . DEXA scan (bone density measurement)  Completed  . Shingles Vaccine  Completed  .  Hepatitis C: One time screening is recommended by Center for Disease Control  (CDC) for  adults born from [birth year] through 1966.   Completed  *Topic was postponed. The date shown is not the original due date.   Preventive Care for Adults  A healthy lifestyle and preventive care can promote health and wellness. Preventive health guidelines for adults include the following key practices.  . A routine yearly physical is a good way to check with your health care provider about your health and preventive screening. It is a chance to share any concerns and updates on your health and to receive a thorough exam.  . Visit your dentist for a routine exam and preventive care every 6 months. Brush your teeth twice a day and floss once a day. Good oral hygiene prevents tooth decay and gum disease.  . The frequency of eye exams is based on your age, health, family medical history, use  of contact lenses, and other factors. Follow your health care provider's ecommendations for frequency of eye exams.  . Eat a healthy diet. Foods like vegetables, fruits, whole grains, low-fat dairy products, and lean protein foods contain the nutrients you  need without too many calories. Decrease your intake of foods high in solid fats, added sugars, and salt. Eat the right amount of calories for you. Get information about a proper diet from your health care provider, if necessary.  . Regular physical exercise is one of the most important things you can do for your health. Most adults should get at least 150 minutes of moderate-intensity exercise (any activity that increases your heart rate and causes you to sweat) each week. In addition, most adults need muscle-strengthening exercises on 2 or more days a week.  Silver Sneakers may be a benefit available to you. To determine eligibility, you may visit the website: www.silversneakers.com or contact program at (912)370-1435 Mon-Fri between 8AM-8PM.   . Maintain a healthy weight. The body mass index (BMI) is a screening tool to identify possible weight problems. It provides an estimate of body fat based on height and weight. Your health care provider can find your BMI and can help you achieve or maintain a healthy weight.   For adults 20 years and older: ? A BMI below 18.5 is considered underweight. ? A BMI of 18.5 to 24.9 is normal. ? A BMI of 25 to 29.9 is considered overweight. ? A BMI of 30 and above is considered obese.   . Maintain normal blood lipids and cholesterol levels by exercising and minimizing your  intake of saturated fat. Eat a balanced diet with plenty of fruit and vegetables. Blood tests for lipids and cholesterol should begin at age 54 and be repeated every 5 years. If your lipid or cholesterol levels are high, you are over 50, or you are at high risk for heart disease, you may need your cholesterol levels checked more frequently. Ongoing high lipid and cholesterol levels should be treated with medicines if diet and exercise are not working.  . If you smoke, find out from your health care provider how to quit. If you do not use tobacco, please do not start.  . If you choose to drink  alcohol, please do not consume more than 2 drinks per day. One drink is considered to be 12 ounces (355 mL) of beer, 5 ounces (148 mL) of wine, or 1.5 ounces (44 mL) of liquor.  . If you are 58-51 years old, ask your health care provider if you should take aspirin to prevent strokes.  . Use sunscreen. Apply sunscreen liberally and repeatedly throughout the day. You should seek shade when your shadow is shorter than you. Protect yourself by wearing long sleeves, pants, a wide-brimmed hat, and sunglasses year round, whenever you are outdoors.  . Once a month, do a whole body skin exam, using a mirror to look at the skin on your back. Tell your health care provider of new moles, moles that have irregular borders, moles that are larger than a pencil eraser, or moles that have changed in shape or color.

## 2016-01-13 NOTE — Progress Notes (Signed)
Pre visit review using our clinic review tool, if applicable. No additional management support is needed unless otherwise documented below in the visit note. 

## 2016-01-13 NOTE — Progress Notes (Signed)
Subjective:   Tammy Neal is a 65 y.o. female who presents for an Initial Medicare Annual Wellness Visit.  Review of Systems    N/A  Cardiac Risk Factors include: advanced age (>56men, >77 women);smoking/ tobacco exposure;dyslipidemia     Objective:    Today's Vitals   01/13/16 1045  BP: 120/70  Pulse: 70  Temp: 98.7 F (37.1 C)  TempSrc: Oral  SpO2: 96%  Weight: 122 lb 12 oz (55.7 kg)  Height: 5' (1.524 m)  PainSc: 0-No pain   Body mass index is 23.97 kg/m.   Current Medications (verified) Outpatient Encounter Prescriptions as of 01/13/2016  Medication Sig  . acetaminophen ([facility] 8 HOUR PAIN RELIEF) 650 MG CR tablet Take 1 capsule by mouth 2 (two) times daily as needed.  . calcium-vitamin D (OSCAL WITH D) 500-200 MG-UNIT tablet Take 1 tablet by mouth 2 (two) times daily.  . vitamin B-12 (CYANOCOBALAMIN) 1000 MCG tablet Take 1,000 mcg by mouth daily.   No facility-administered encounter medications on file as of 01/13/2016.     Allergies (verified) Voltaren [diclofenac sodium]; Aricept [donepezil hcl]; Aspirin; Atorvastatin; and Simvastatin   History: Past Medical History:  Diagnosis Date  . Allergy    allergic rhinitis  . Arthritis   . COPD (chronic obstructive pulmonary disease) ([place])   . Depression    no per pt  . Hyperlipidemia    no meds  . Neck pain, chronic    with fusion  . Osteoporosis   . Psoriasis    Past Surgical History:  Procedure Laterality Date  . ABDOMINAL HYSTERECTOMY     endometriosis 1 ovary left  . APPENDECTOMY    . SPINE SURGERY  1977   cervical fusion Dr Carloyn Manner x2   Family History  Problem Relation Age of Onset  . Hypertension Mother   . Diabetes Mother   . Stroke Mother 65  . Asthma Father   . COPD Father   . Colon cancer Neg Hx   . Esophageal cancer Neg Hx   . Ulcerative colitis Neg Hx   . Stomach cancer Neg Hx    Social History   Occupational History  . Not on file.   Social History Main Topics  . Smoking  status: Current Every Day Smoker    Packs/day: 0.50  . Smokeless tobacco: Never Used  . Alcohol use No  . Drug use: No  . Sexual activity: No    Tobacco Counseling Ready to quit: No Counseling given: No   Activities of Daily Living In your present state of health, do you have any difficulty performing the following activities: 01/13/2016  Hearing? N  Vision? N  Difficulty concentrating or making decisions? Y  Walking or climbing stairs? N  Dressing or bathing? N  Doing errands, shopping? N  Preparing Food and eating ? N  Using the Toilet? N  In the past six months, have you accidently leaked urine? N  Do you have problems with loss of bowel control? N  Managing your Medications? N  Managing your Finances? N  Housekeeping or managing your Housekeeping? N  Some recent data might be hidden    Immunizations and Health Maintenance Immunization History  Administered Date(s) Administered  . Influenza Split 11/02/2011  . Influenza Whole 02/21/1997, 11/22/2006, 11/22/2007  . Influenza,inj,Quad PF,36+ Mos 11/25/2014, 10/30/2015  . Pneumococcal Conjugate-13 10/30/2015  . Pneumococcal Polysaccharide-23 10/30/2002, 05/03/2010  . Td 04/25/2005  . Zoster 07/06/2010   There are no preventive care reminders to display for  this patient.  Patient Care Team: Abner Greenspan, MD as PCP - General     Assessment:   This is a routine wellness examination for Tammy Neal.   Hearing/Vision screen  Hearing Screening   125Hz  250Hz  500Hz  1000Hz  2000Hz  3000Hz  4000Hz  6000Hz  8000Hz   Right ear:   0 0 40  40    Left ear:   0 40 40  0      Visual Acuity Screening   Right eye Left eye Both eyes  Without correction: 20/70 20/50 20/30-1  With correction:       Dietary issues and exercise activities discussed: Current Exercise Habits: The patient does not participate in regular exercise at present, Exercise limited by: None identified  Goals    . Increase water intake          Starting  01/13/2016, I will attempt to drink at least 8 oz of water with each meal daily.       Depression Screen PHQ 2/9 Scores 01/13/2016  PHQ - 2 Score 0    Fall Risk Fall Risk  01/13/2016  Falls in the past year? No    Cognitive Function: MMSE - Mini Mental State Exam 01/13/2016  Orientation to time 3  Orientation to time comments pt was unable to state the current year  Orientation to Place 5  Registration 3  Attention/ Calculation 0  Recall 0  Recall-comments pt was unable to recall 3 of 3 words  Language- name 2 objects 0  Language- repeat 1  Language- follow 3 step command 3  Language- read & follow direction 0  Write a sentence 0  Copy design 0  Total score 15     PLEASE NOTE: A Mini-Cog screen was completed. Maximum score is 20. A value of 0 denotes this part of [place] MMSE was not completed or the patient failed this part of the Mini-Cog screening.   Mini-Cog Screening Orientation to Time - Max 5 pts Orientation to Place - Max 5 pts Registration - Max 3 pts Recall - Max 3 pts Language Repeat - Max 1 pts Language Follow 3 Step Command - Max 3 pts     Screening Tests Health Maintenance  Topic Date Due  . TETANUS/TDAP  04/24/2025 (Originally 04/26/2015)  . PNA vac Low Risk Adult (2 of 2 - PPSV23) 10/29/2016  . MAMMOGRAM  01/06/2017  . COLONOSCOPY  09/28/2018  . INFLUENZA VACCINE  Addressed  . DEXA SCAN  Completed  . ZOSTAVAX  Completed  . Hepatitis C Screening  Completed      Plan:   I have personally reviewed and addressed the Medicare Annual Wellness questionnaire and have noted the following in the patient's chart:  A. Medical and social history B. Use of alcohol, tobacco or illicit drugs  C. Current medications and supplements D. Functional ability and status E.  Nutritional status F.  Physical activity G. Advance directives H. List of other physicians I.  Hospitalizations, surgeries, and ER visits in previous 12 months J.  [place]  to include hearing, vision, cognitive, depression L. Referrals and appointments - none  In addition, I have reviewed and discussed with patient certain preventive protocols, quality metrics, and best practice recommendations. A written personalized care plan for preventive services as well as general preventive health recommendations were provided to patient.  See attached scanned questionnaire for additional information.   Signed,   Lindell Noe, MHA, BS, LPN Health Coach

## 2016-01-14 LAB — HEPATITIS C ANTIBODY: HCV AB: NEGATIVE

## 2016-01-22 ENCOUNTER — Ambulatory Visit: Payer: Medicare Other

## 2016-07-13 ENCOUNTER — Ambulatory Visit (INDEPENDENT_AMBULATORY_CARE_PROVIDER_SITE_OTHER): Payer: Medicare Other | Admitting: Family Medicine

## 2016-07-13 ENCOUNTER — Encounter: Payer: Self-pay | Admitting: Family Medicine

## 2016-07-13 ENCOUNTER — Ambulatory Visit: Payer: Medicare Other | Admitting: Family Medicine

## 2016-07-13 VITALS — BP 146/72 | HR 69 | Temp 98.3°F | Ht 59.5 in | Wt 118.5 lb

## 2016-07-13 DIAGNOSIS — M1612 Unilateral primary osteoarthritis, left hip: Secondary | ICD-10-CM

## 2016-07-13 DIAGNOSIS — F17200 Nicotine dependence, unspecified, uncomplicated: Secondary | ICD-10-CM | POA: Diagnosis not present

## 2016-07-13 NOTE — Progress Notes (Signed)
Subjective:    Patient ID: Tammy Neal, female    DOB: 1950-09-06, 66 y.o.   MRN: [id number]  HPI Here for discussion of orthopedic problem    Was seeing Dr Lorelei Pont for hip arthritis  She has had cortisone shots with limited help  Getting up and down is very painful  Walking hurts and she has "catches" that almost make her fall Afraid she will fall   She pushes through pain and remains very active   Is ready to see orthopedics   Last xray in 2016 verified OA in L hip   She has cut down smoking to 1/2 ppd  A little congested today- getting over a cold  Denies need for inhaler   Patient Active Problem List   Diagnosis Date Noted  . Arthritis of left hip 07/13/2016  . Vitamin B12 deficiency 12/29/2015  . Estrogen deficiency 12/29/2015  . Cough 04/30/2015  . Mild cognitive impairment 01/02/2015  . Routine general medical examination at a health care facility 10/26/2011  . Gynecological examination 07/05/2010  . DEPRESSION 05/03/2010  . Hyperlipidemia 02/26/2007  . TOBACCO ABUSE 02/26/2007  . PSORIASIS 02/26/2007   Past Medical History:  Diagnosis Date  . Allergy    allergic rhinitis  . Arthritis   . COPD (chronic obstructive pulmonary disease) ([place])   . Depression    no per pt  . Hyperlipidemia    no meds  . Neck pain, chronic    with fusion  . Osteoporosis   . Psoriasis    Past Surgical History:  Procedure Laterality Date  . ABDOMINAL HYSTERECTOMY     endometriosis 1 ovary left  . APPENDECTOMY    . SPINE SURGERY  1977   cervical fusion Dr Carloyn Manner x2   Social History  Substance Use Topics  . Smoking status: Current Every Day Smoker    Packs/day: 0.50  . Smokeless tobacco: Never Used  . Alcohol use No   Family History  Problem Relation Age of Onset  . Hypertension Mother   . Diabetes Mother   . Stroke Mother 50  . Asthma Father   . COPD Father   . Colon cancer Neg Hx   . Esophageal cancer Neg Hx   . Ulcerative colitis Neg Hx   . Stomach cancer  Neg Hx    Allergies  Allergen Reactions  . Voltaren [Diclofenac Sodium] Anaphylaxis  . Aricept [Donepezil Hcl] Itching    Itchy, jittery (no hives)  . Aspirin     REACTION: GI upset  . Atorvastatin     REACTION: pain  . Simvastatin     REACTION: muscle pain   Current Outpatient Prescriptions on File Prior to Visit  Medication Sig Dispense Refill  . acetaminophen ([facility]S 8 HOUR PAIN RELIEF) 650 MG CR tablet Take 1 capsule by mouth 2 (two) times daily as needed.     No current facility-administered medications on file prior to visit.      Review of Systems    Review of Systems  Constitutional: Negative for fever, appetite change, fatigue and unexpected weight change.  Eyes: Negative for pain and visual disturbance.  Respiratory: Negative for  shortness of breath.  pos for recent cough from a cold (otherwise stable) Cardiovascular: Negative for cp or palpitations    Gastrointestinal: Negative for nausea, diarrhea and constipation.  Genitourinary: Negative for urgency and frequency.  Skin: Negative for pallor or rash   MSK pos for L hip pain - in groin and outer hip worse with  movement Neurological: Negative for weakness, light-headedness, numbness and headaches.  Hematological: Negative for adenopathy. Does not bruise/bleed easily.  Psychiatric/Behavioral: Negative for dysphoric mood. The patient is not nervous/anxious.  pos for mild cognitive impairment     Objective:   Physical Exam  Constitutional: She appears well-developed and well-nourished. No distress.  Well appearing   Neck: Normal range of motion. Neck supple.  Cardiovascular: Normal rate, regular rhythm and normal heart sounds.   Pulmonary/Chest: Effort normal. No respiratory distress. She has wheezes.  Diffusely distant bs occ exp wheeze with fair air exch  Musculoskeletal:       Left hip: She exhibits decreased range of motion and bony tenderness. She exhibits normal strength, no swelling, no crepitus and no  deformity.  Mild L greater troch tenderness Pain on int /ext rot of hip  Pain to flex beyond 90 deg Pain to abduct beyond 30 deg  No crepitus No swelling  No ecchymosis   Gait favors R leg   Neurological: She is alert. She has normal reflexes. She displays no atrophy. She exhibits normal muscle tone.  Skin: Skin is warm and dry. No rash noted. No erythema.  Psychiatric: She has a normal mood and affect.          Assessment & Plan:   Problem List Items Addressed This Visit      Musculoskeletal and Integument   Arthritis of left hip - Primary    Reviewed last films from 2016 Has had several injections and takes acetaminophen Interested in further options for treatment  Unsure how close to needing surgery she is  Very active-recommend use of cane Ref to orthopedics for further eval and treatment  Also enc smoking cessation       Relevant Orders   Ambulatory referral to Orthopedic Surgery     Other   TOBACCO ABUSE    Disc in detail risks of smoking and possible outcomes including copd, vascular/ heart disease, cancer , respiratory and sinus infections  Pt voices understanding Per pt cut back from 1 to 1/2 ppd  Not yet ready to quit

## 2016-07-13 NOTE — Assessment & Plan Note (Signed)
Disc in detail risks of smoking and possible outcomes including copd, vascular/ heart disease, cancer , respiratory and sinus infections  Pt voices understanding Per pt cut back from 1 to 1/2 ppd  Not yet ready to quit

## 2016-07-13 NOTE — Assessment & Plan Note (Signed)
Reviewed last films from 2016 Has had several injections and takes acetaminophen Interested in further options for treatment  Unsure how close to needing surgery she is  Very active-recommend use of cane Ref to orthopedics for further eval and treatment  Also enc smoking cessation

## 2016-07-13 NOTE — Patient Instructions (Signed)
Start using a cane on the L side when walking for support of the hip   Try putting ice on the painful area if needed   We will refer you to orthopedics

## 2016-08-17 ENCOUNTER — Telehealth: Payer: Self-pay

## 2016-08-17 DIAGNOSIS — M1612 Unilateral primary osteoarthritis, left hip: Secondary | ICD-10-CM | POA: Diagnosis not present

## 2016-08-17 NOTE — Telephone Encounter (Signed)
That is fine with me but I cannot do it until I return - please put one on my desk thanks

## 2016-08-17 NOTE — Telephone Encounter (Signed)
Tammy Neal left v/m requesting cb to pt; Tammy Neal wants to know if pt can get a handicap tag until pt can have hip replacement surgery in 10/2016.pt last seen 07/13/16.Please advise.

## 2016-08-18 NOTE — Telephone Encounter (Signed)
Patient advised.  Handicap placard form placed in Dr. Marliss Coots In Box to await her return.  Patient says next week is fine.

## 2016-08-21 NOTE — Telephone Encounter (Signed)
I signed it - on my desk

## 2016-08-22 NOTE — Telephone Encounter (Signed)
Pt notified Rx ready for pickup 

## 2016-09-09 DIAGNOSIS — M1612 Unilateral primary osteoarthritis, left hip: Secondary | ICD-10-CM | POA: Diagnosis not present

## 2016-09-21 ENCOUNTER — Ambulatory Visit: Payer: Medicare Other | Admitting: Family Medicine

## 2016-10-27 ENCOUNTER — Ambulatory Visit: Payer: Self-pay | Admitting: Orthopedic Surgery

## 2016-11-03 NOTE — Patient Instructions (Addendum)
Tammy Neal  11/03/2016   Your procedure is scheduled on: 11-16-16   Report to [facility] Main  Entrance Report to Admitting at 7:45 AM   Call this number if you have problems the morning of surgery  [phone]   Remember: ONLY 1 PERSON MAY GO WITH YOU TO SHORT STAY TO GET  READY MORNING OF YOUR SURGERY.  Do not eat food or drink liquids :After Midnight.     Take these medicines the morning of surgery with A SIP OF WATER: None                                You may not have any metal on your body including hair pins and              piercings  Do not wear jewelry, make-up, lotions, powders or perfumes, deodorant             Do not wear nail polish.  Do not shave  48 hours prior to surgery.               Do not bring valuables to the hospital. [place].  Contacts, dentures or bridgework may not be worn into surgery.  Leave suitcase in the car. After surgery it may be brought to your room.                Please read over the following fact sheets you were given: _____________________________________________________________________             [facility] - Preparing for Surgery Before surgery, you can play an important role.  Because skin is not sterile, your skin needs to be as free of germs as possible.  You can reduce the number of germs on your skin by washing with CHG (chlorahexidine gluconate) soap before surgery.  CHG is an antiseptic cleaner which kills germs and bonds with the skin to continue killing germs even after washing. Please DO NOT use if you have an allergy to CHG or antibacterial soaps.  If your skin becomes reddened/irritated stop using the CHG and inform your nurse when you arrive at Short Stay. Do not shave (including legs and underarms) for at least 48 hours prior to the first CHG shower.  You may shave your face/neck. Please follow these instructions carefully:  1.  Shower with  CHG Soap the night before surgery and the  morning of Surgery.  2.  If you choose to wash your hair, wash your hair first as usual with your  normal  shampoo.  3.  After you shampoo, rinse your hair and body thoroughly to remove the  shampoo.                           4.  Use CHG as you would any other liquid soap.  You can apply chg directly  to the skin and wash                       Gently with a scrungie or clean washcloth.  5.  Apply the CHG Soap to your body ONLY FROM THE NECK DOWN.   Do not use on face/  open                           Wound or open sores. Avoid contact with eyes, ears mouth and genitals (private parts).                       Wash face,  Genitals (private parts) with your normal soap.             6.  Wash thoroughly, paying special attention to the area where your surgery  will be performed.  7.  Thoroughly rinse your body with warm water from the neck down.  8.  DO NOT shower/wash with your normal soap after using and rinsing off  the CHG Soap.                9.  Pat yourself dry with a clean towel.            10.  Wear clean pajamas.            11.  Place clean sheets on your bed the night of your first shower and do not  sleep with pets. Day of Surgery : Do not apply any lotions/deodorants the morning of surgery.  Please wear clean clothes to the hospital/surgery center.  FAILURE TO FOLLOW THESE INSTRUCTIONS MAY RESULT IN THE CANCELLATION OF YOUR SURGERY PATIENT SIGNATURE_________________________________  NURSE SIGNATURE__________________________________  ________________________________________________________________________   Adam Phenix  An incentive spirometer is a tool that can help keep your lungs clear and active. This tool measures how well you are filling your lungs with each breath. Taking long deep breaths may help reverse or decrease the chance of developing breathing (pulmonary) problems (especially infection) following:  A long period of  time when you are unable to move or be active. BEFORE THE PROCEDURE   If the spirometer includes an indicator to show your best effort, your nurse or respiratory therapist will set it to a desired goal.  If possible, sit up straight or lean slightly forward. Try not to slouch.  Hold the incentive spirometer in an upright position. INSTRUCTIONS FOR USE  1. Sit on the edge of your bed if possible, or sit up as far as you can in bed or on a chair. 2. Hold the incentive spirometer in an upright position. 3. Breathe out normally. 4. Place the mouthpiece in your mouth and seal your lips tightly around it. 5. Breathe in slowly and as deeply as possible, raising the piston or the ball toward the top of the column. 6. Hold your breath for 3-5 seconds or for as long as possible. Allow the piston or ball to fall to the bottom of the column. 7. Remove the mouthpiece from your mouth and breathe out normally. 8. Rest for a few seconds and repeat Steps 1 through 7 at least 10 times every 1-2 hours when you are awake. Take your time and take a few normal breaths between deep breaths. 9. The spirometer may include an indicator to show your best effort. Use the indicator as a goal to work toward during each repetition. 10. After each set of 10 deep breaths, practice coughing to be sure your lungs are clear. If you have an incision (the cut made at the time of surgery), support your incision when coughing by placing a pillow or rolled up towels firmly against it. Once you are able to get out of bed, walk around indoors and  cough well. You may stop using the incentive spirometer when instructed by your caregiver.  RISKS AND COMPLICATIONS  Take your time so you do not get dizzy or light-headed.  If you are in pain, you may need to take or ask for pain medication before doing incentive spirometry. It is harder to take a deep breath if you are having pain. AFTER USE  Rest and breathe slowly and easily.  It can  be helpful to keep track of a log of your progress. Your caregiver can provide you with a simple table to help with this. If you are using the spirometer at home, follow these instructions: [place] IF:   You are having difficultly using the spirometer.  You have trouble using the spirometer as often as instructed.  Your pain medication is not giving enough relief while using the spirometer.  You develop fever of 100.5 F (38.1 C) or higher. SEEK IMMEDIATE MEDICAL CARE IF:   You cough up bloody sputum that had not been present before.  You develop fever of 102 F (38.9 C) or greater.  You develop worsening pain at or near the incision site. MAKE SURE YOU:   Understand these instructions.  Will watch your condition.  Will get help right away if you are not doing well or get worse. Document Released: 06/20/2006 Document Revised: 05/02/2011 Document Reviewed: 08/21/2006 ExitCare Patient Information 2014 ExitCare, [place].   ________________________________________________________________________  WHAT IS A BLOOD TRANSFUSION? Blood Transfusion Information  A transfusion is the replacement of blood or some of its parts. Blood is made up of multiple cells which provide different functions.  Red blood cells carry oxygen and are used for blood loss replacement.  White blood cells fight against infection.  Platelets control bleeding.  Plasma helps clot blood.  Other blood products are available for specialized needs, such as hemophilia or other clotting disorders. BEFORE THE TRANSFUSION  Who gives blood for transfusions?   Healthy volunteers who are fully evaluated to make sure their blood is safe. This is blood bank blood. Transfusion therapy is the safest it has ever been in the practice of medicine. Before blood is taken from a donor, a complete history is taken to make sure that person has no history of diseases nor engages in risky social behavior (examples are  intravenous drug use or sexual activity with multiple partners). The donor's travel history is screened to minimize risk of transmitting infections, such as malaria. The donated blood is tested for signs of infectious diseases, such as HIV and hepatitis. The blood is then tested to be sure it is compatible with you in order to minimize the chance of a transfusion reaction. If you or a relative donates blood, this is often done in anticipation of surgery and is not appropriate for emergency situations. It takes many days to process the donated blood. RISKS AND COMPLICATIONS Although transfusion therapy is very safe and saves many lives, the main dangers of transfusion include:   Getting an infectious disease.  Developing a transfusion reaction. This is an allergic reaction to something in the blood you were given. Every precaution is taken to prevent this. The decision to have a blood transfusion has been considered carefully by your caregiver before blood is given. Blood is not given unless the benefits outweigh the risks. AFTER THE TRANSFUSION  Right after receiving a blood transfusion, you will usually feel much better and more energetic. This is especially true if your red blood cells have gotten low (anemic).  The transfusion raises the level of the red blood cells which carry oxygen, and this usually causes an energy increase.  The nurse administering the transfusion will monitor you carefully for complications. HOME CARE INSTRUCTIONS  No special instructions are needed after a transfusion. You may find your energy is better. Speak with your caregiver about any limitations on activity for underlying diseases you may have. SEEK MEDICAL CARE IF:   Your condition is not improving after your transfusion.  You develop redness or irritation at the intravenous (IV) site. SEEK IMMEDIATE MEDICAL CARE IF:  Any of the following symptoms occur over the next 12 hours:  Shaking chills.  You have a  temperature by mouth above 102 F (38.9 C), not controlled by medicine.  Chest, back, or muscle pain.  People around you feel you are not acting correctly or are confused.  Shortness of breath or difficulty breathing.  Dizziness and fainting.  You get a rash or develop hives.  You have a decrease in urine output.  Your urine turns a dark color or changes to pink, red, or brown. Any of the following symptoms occur over the next 10 days:  You have a temperature by mouth above 102 F (38.9 C), not controlled by medicine.  Shortness of breath.  Weakness after normal activity.  The white part of the eye turns yellow (jaundice).  You have a decrease in the amount of urine or are urinating less often.  Your urine turns a dark color or changes to pink, red, or brown. Document Released: 02/05/2000 Document Revised: 05/02/2011 Document Reviewed: 09/24/2007 [place] Patient Information 2014 [place], [place].  _______________________________________________________________________

## 2016-11-04 ENCOUNTER — Encounter: Payer: Self-pay | Admitting: Family Medicine

## 2016-11-04 ENCOUNTER — Ambulatory Visit: Payer: Medicare Other | Admitting: Family Medicine

## 2016-11-04 ENCOUNTER — Ambulatory Visit (INDEPENDENT_AMBULATORY_CARE_PROVIDER_SITE_OTHER): Payer: Medicare Other | Admitting: Family Medicine

## 2016-11-04 VITALS — BP 136/78 | HR 69 | Temp 98.8°F | Ht 59.5 in | Wt 122.0 lb

## 2016-11-04 DIAGNOSIS — Z23 Encounter for immunization: Secondary | ICD-10-CM

## 2016-11-04 DIAGNOSIS — Z01810 Encounter for preprocedural cardiovascular examination: Secondary | ICD-10-CM | POA: Diagnosis not present

## 2016-11-04 DIAGNOSIS — Z01818 Encounter for other preprocedural examination: Secondary | ICD-10-CM | POA: Diagnosis not present

## 2016-11-04 DIAGNOSIS — F17200 Nicotine dependence, unspecified, uncomplicated: Secondary | ICD-10-CM

## 2016-11-04 DIAGNOSIS — I700 Atherosclerosis of aorta: Secondary | ICD-10-CM

## 2016-11-04 NOTE — Patient Instructions (Signed)
Flu shot today   No restrictions for surgery   I will send paperwork to Dr Stacie Glaze luck with everything   Consider quitting smoking during the process

## 2016-11-04 NOTE — Progress Notes (Signed)
Subjective:    Patient ID: Tammy Neal, female    DOB: December 05, 1950, 66 y.o.   MRN: [id number]  HPI Here for pre operative exam   She is scheduled for a total L hip replacement 9/26 with Dr Maureen Ralphs  Feels fine overall except for hip pain   Wt Readings from Last 3 Encounters:  11/04/16 122 lb (55.3 kg)  07/13/16 118 lb 8 oz (53.8 kg)  01/13/16 122 lb 12 oz (55.7 kg)   BP Readings from Last 3 Encounters:  11/04/16 136/78  07/13/16 (!) 146/72  01/13/16 120/70   Pulse Readings from Last 3 Encounters:  11/04/16 69  07/13/16 69  01/13/16 70    EKG NSR rate of 70 PRI 116   Smoker -about 1/2 a pack  Would consider quitting  Will not be able to smoke in the house   No breathing problems   Prev surgeries incl hysterectomy appy Spine sx  No anesthesia problems  GI upset with aspirin   Nl cxr a year ago  Has aortic atherosclerosis   Lab Results  Component Value Date   CHOL 223 (H) 12/22/2015   HDL 44.10 12/22/2015   LDLDIRECT 138.0 12/22/2015   TRIG 217.0 (H) 12/22/2015   CHOLHDL 5 12/22/2015   Unfortunately intolerant of statins    Hx of MCI - this is stable   Patient Active Problem List   Diagnosis Date Noted  . Pre-operative exam 11/04/2016  . Arthritis of left hip 07/13/2016  . Vitamin B12 deficiency 12/29/2015  . Estrogen deficiency 12/29/2015  . Cough 04/30/2015  . Mild cognitive impairment 01/02/2015  . Routine general medical examination at a health care facility 10/26/2011  . Gynecological examination 07/05/2010  . DEPRESSION 05/03/2010  . Hyperlipidemia 02/26/2007  . TOBACCO ABUSE 02/26/2007  . PSORIASIS 02/26/2007   Past Medical History:  Diagnosis Date  . Allergy    allergic rhinitis  . Arthritis   . COPD (chronic obstructive pulmonary disease) ([place])   . Depression    no per pt  . Hyperlipidemia    no meds  . Neck pain, chronic    with fusion  . Osteoporosis   . Psoriasis    Past Surgical History:  Procedure Laterality Date    . ABDOMINAL HYSTERECTOMY     endometriosis 1 ovary left  . APPENDECTOMY    . SPINE SURGERY  1977   cervical fusion Dr Carloyn Manner x2   Social History  Substance Use Topics  . Smoking status: Current Every Day Smoker    Packs/day: 0.50  . Smokeless tobacco: Never Used  . Alcohol use No   Family History  Problem Relation Age of Onset  . Hypertension Mother   . Diabetes Mother   . Stroke Mother 9  . Asthma Father   . COPD Father   . Colon cancer Neg Hx   . Esophageal cancer Neg Hx   . Ulcerative colitis Neg Hx   . Stomach cancer Neg Hx    Allergies  Allergen Reactions  . Voltaren [Diclofenac Sodium] Anaphylaxis  . Aricept [Donepezil Hcl] Itching    Itchy, jittery (no hives)  . Aspirin Other (See Comments)    REACTION: GI upset  . Atorvastatin     REACTION: pain  . Simvastatin     REACTION: muscle pain   Current Outpatient Prescriptions on File Prior to Visit  Medication Sig Dispense Refill  . acetaminophen ([facility] 8 HOUR PAIN RELIEF) 650 MG CR tablet Take 650 mg by mouth 2 (  two) times daily as needed for pain.      No current facility-administered medications on file prior to visit.     Review of Systems  Constitutional: Negative for activity change, appetite change, fatigue, fever and unexpected weight change.  HENT: Negative for congestion, ear pain, rhinorrhea, sinus pressure and sore throat.   Eyes: Negative for pain, redness and visual disturbance.  Respiratory: Negative for cough, shortness of breath and wheezing.   Cardiovascular: Negative for chest pain and palpitations.  Gastrointestinal: Negative for abdominal pain, blood in stool, constipation and diarrhea.  Endocrine: Negative for polydipsia and polyuria.  Genitourinary: Negative for dysuria, frequency and urgency.  Musculoskeletal: Positive for arthralgias. Negative for back pain and myalgias.       Pos for L hip pain with limited weight bearing   Skin: Negative for pallor and rash.  Allergic/Immunologic:  Negative for environmental allergies.  Neurological: Negative for dizziness, syncope and headaches.  Hematological: Negative for adenopathy. Does not bruise/bleed easily.  Psychiatric/Behavioral: Negative for decreased concentration and dysphoric mood. The patient is not nervous/anxious.        Pos for memory loss/mild cognitive impairment        Objective:   Physical Exam  Constitutional: She appears well-developed and well-nourished. No distress.  Well appearing   HENT:  Head: Normocephalic and atraumatic.  Mouth/Throat: Oropharynx is clear and moist.  Eyes: Pupils are equal, round, and reactive to light. Conjunctivae and EOM are normal.  Neck: Normal range of motion. Neck supple. No JVD present. Carotid bruit is not present. No thyromegaly present.  Cardiovascular: Normal rate, regular rhythm, normal heart sounds and intact distal pulses.  Exam reveals no gallop.   Pulmonary/Chest: Effort normal and breath sounds normal. No respiratory distress. She has no wheezes. She has no rales.  No crackles  bs are mildly distant  Good air exch /no wheezing  Abdominal: Soft. Bowel sounds are normal. She exhibits no distension, no abdominal bruit and no mass. There is no tenderness.  Musculoskeletal: She exhibits no edema.  Lymphadenopathy:    She has no cervical adenopathy.  Neurological: She is alert. She has normal reflexes.  Skin: Skin is warm and dry. No rash noted.  Psychiatric: She has a normal mood and affect.  Baseline mild cognitive impairment Not acutely confused today Pleasant           Assessment & Plan:   Problem List Items Addressed This Visit      Other   Pre-operative exam    66 yo smoker for L total hip replacement  Stable vitals and EKG CXR 1 y ago rev (some aortic atherosclerosis noted)  Multiple past surgeries w/o problems  Has GI upset with ASA   No current breathing problems or copd symptoms  Cleared medically for surgery         TOBACCO ABUSE     Disc in detail risks of smoking and possible outcomes including copd, vascular/ heart disease, cancer , respiratory and sinus infections  Pt voices understanding Urged pt to attempt to quit smoking while she is recovering from her hip surgery (she will not be able to go outside to smoke) She will consider this        Other Visit Diagnoses    Pre-operative cardiovascular examination    -  Primary   Relevant Orders   EKG 12-Lead (Completed)   Need for influenza vaccination       Relevant Orders   Flu Vaccine QUAD 6+ mos PF IM (Fluarix  Quad PF) (Completed)

## 2016-11-06 ENCOUNTER — Ambulatory Visit: Payer: Self-pay | Admitting: Orthopedic Surgery

## 2016-11-06 DIAGNOSIS — I700 Atherosclerosis of aorta: Secondary | ICD-10-CM | POA: Insufficient documentation

## 2016-11-06 NOTE — Assessment & Plan Note (Signed)
66 yo smoker for L total hip replacement  Stable vitals and EKG CXR 1 y ago rev (some aortic atherosclerosis noted)  Multiple past surgeries w/o problems  Has GI upset with ASA   No current breathing problems or copd symptoms  Cleared medically for surgery

## 2016-11-06 NOTE — Assessment & Plan Note (Signed)
Seen incidentally on last cxr a year ago  No symptoms  Lipids rev - intol of statins  Smoker- again counseled to quit  No bp issues

## 2016-11-06 NOTE — H&P (Deleted)
  The note originally documented on this encounter has been moved the the encounter in which it belongs.  

## 2016-11-06 NOTE — Assessment & Plan Note (Signed)
Disc in detail risks of smoking and possible outcomes including copd, vascular/ heart disease, cancer , respiratory and sinus infections  Pt voices understanding Urged pt to attempt to quit smoking while she is recovering from her hip surgery (she will not be able to go outside to smoke) She will consider this

## 2016-11-06 NOTE — H&P (Signed)
Tammy Neal  DOB: 1951-01-01 Divorced / Language: Cleophus Molt / Race: White Female Date of admission: November 16, 2016  Chief complaint: Left hip pain History of Present Illness  The patient is a 66 year old female who comes in today for a preoperative History and Physical. The patient is scheduled for a left total hip arthroplasty (anterior) to be performed by Dr. Dione Plover. Aluisio, MD at [facility] on 11/16/2016. The patient is a 66 year old female who presented for follow up of their hip. The patient is being followed for their left hip pain and osteoarthritis. They are 3 year(s) out from when symptoms began. Symptoms reported include: pain, throbbing, stiffness, catching and difficulty ambulating. The patient feels that they are doing poorly. The following medication has been used for pain control: Tylenol. The patient has not gotten any relief of their symptoms with Cortisone injections. She is now scheduled for THA today 11/16/16. She states that the LEFT hip is hurting her at all times. Is limiting what she can and cannot do. she is even having pain at night. She is ready to proceed with surgery. They have been treated conservatively in the past for the above stated problem and despite conservative measures, they continue to have progressive pain and severe functional limitations and dysfunction. They have failed non-operative management including home exercise, medications. It is felt that they would benefit from undergoing total joint replacement. Risks and benefits of the procedure have been discussed with the patient and they elect to proceed with surgery. There are no active contraindications to surgery such as ongoing infection or rapidly progressive neurological disease.    Problem List/Past Medical  Primary osteoarthritis of left hip (M16.12)  Chronic Obstructive Lung Disease  Hypercholesterolemia  Osteoarthritis  Osteoporosis  Rheumatoid Arthritis  Measles  Mumps   Shingles  Impaired Memory    Allergies  Aspirin *ANALGESICS - NonNarcotic*  Aricept *PSYCHOTHERAPEUTIC AND NEUROLOGICAL AGENTS - MISC.*  Voltaren *DERMATOLOGICALS*  Atorvastatin Calcium *ANTIHYPERLIPIDEMICS*  Simvastatin *ANTIHYPERLIPIDEMICS*   Family History  Chronic Obstructive Lung Disease  Father. Diabetes Mellitus  Mother. Hypertension  Mother.  Social History  Children  2 Current work status  retired Furniture conservator/restorer daily; does running / walking Former drinker  08/17/2016: In the past drank hard liquor only occasionally per week Living situation  live alone Marital status  divorced No history of drug/alcohol rehab  Not under pain contract  Number of flights of stairs before winded  greater than 5 Tobacco use  Current every day smoker. 08/17/2016: smoke(d) 1/2 pack(s) per day [place] With Family. Advance Directives  Living Will, Healthcare Power of [place].  Medication History Tylenol (325MG  Tablet, 1 (one) Oral) Active.   Past Surgical History  Breast Biopsy  right Hysterectomy  partial (non-cancerous) Neck Disc Surgery      Review of Systems General Not Present- Chills, Fatigue, Fever, Memory Loss, Night Sweats, Weight Gain and Weight Loss. Skin Present- Psoriasis. Not Present- Eczema, Hives, Itching, Lesions and Rash. HEENT Not Present- Dentures, Double Vision, Headache, Hearing Loss, Tinnitus and Visual Loss. Neck Present- Neck Pain and Neck Stiffness. Respiratory Not Present- Allergies, Chronic Cough, Coughing up blood, Shortness of breath at rest and Shortness of breath with exertion. Cardiovascular Not Present- Chest Pain, Difficulty Breathing Lying Down, Murmur, Palpitations, Racing/skipping heartbeats and Swelling. Gastrointestinal Not Present- Abdominal Pain, Bloody Stool, Constipation, Diarrhea, Difficulty Swallowing, Heartburn, Jaundice, Loss of appetitie, Nausea and Vomiting. Female Genitourinary  Not Present- Blood in Urine, Discharge, Flank Pain,  Incontinence, Painful Urination, Urgency, Urinary frequency, Urinary Retention, Urinating at Night and Weak urinary stream. Musculoskeletal Present- Joint Pain and Muscle Pain. Not Present- Back Pain, Joint Swelling, Morning Stiffness, Muscle Weakness and Spasms. Neurological Not Present- Blackout spells, Difficulty with balance, Dizziness, Paralysis, Tremor and Weakness. Psychiatric Present- Memory Loss. Not Present- Insomnia.  Vitals  Weight: 122 lb Height: 62in Weight was reported by patient. Height was reported by patient. Body Surface Area: 1.55 m Body Mass Index: 22.31 kg/m  BP: 125/78 (Sitting, Right Arm, Standard)    Physical Exam  General Mental Status -Alert, cooperative and good historian. General Appearance-pleasant, Not in acute distress. Orientation-Oriented X3. Build & Nutrition-Well nourished and Well developed.  Head and Neck Head-normocephalic, atraumatic . Neck Global Assessment - supple, no bruit auscultated on the right, no bruit auscultated on the left.  Eye Pupil - Bilateral-Regular and Round. Motion - Bilateral-EOMI.  Chest and Lung Exam Auscultation Breath sounds - clear at anterior chest wall and clear at posterior chest wall. Adventitious sounds - No Adventitious sounds.  Cardiovascular Auscultation Rhythm - Regular rate and rhythm. Heart Sounds - S1 WNL and S2 WNL. Murmurs & Other Heart Sounds - Auscultation of the heart reveals - No Murmurs.  Abdomen Palpation/Percussion Tenderness - Abdomen is non-tender to palpation. Rigidity (guarding) - Abdomen is soft. Auscultation Auscultation of the abdomen reveals - Bowel sounds normal.  Female Genitourinary Note: Not done, not pertinent to present illness   Musculoskeletal Note: Her RIGHT hip shows flexion 110 rotation and 20 out of 30 abduction 30 without discomfort. She is nontender about the RIGHT greater trochanter.  LEFT hip flexion 100 no internal rotation about 10-20 of external rotation 20 of abduction. She has a significantly antalgic gait pattern on the LEFT.  Review of radiographs AP pelvis lateral LEFT hip show bone-on-bone arthritis in the LEFT hip with subchondral cystic formation. She has some degeneration in her RIGHT but not as bad.    Assessment & Plan Primary osteoarthritis of left hip (Principal Diagnosis) (M16.12)  Note:Surgical Plans: Left Total Hip Replacement - Anterior Approach  Disposition: home with family  PCP: Marne Tower  IV TXA  Anesthesia Issues: None  Patient was instructed on what medications to stop prior to surgery.  Signed electronically by Joelene Millin, III PA-C

## 2016-11-07 ENCOUNTER — Encounter (HOSPITAL_COMMUNITY)
Admission: RE | Admit: 2016-11-07 | Discharge: 2016-11-07 | Disposition: A | Discharge status: Home / Self Care | Payer: Medicare Other | Source: Ambulatory Visit | Attending: Orthopedic Surgery | Admitting: Orthopedic Surgery

## 2016-11-07 ENCOUNTER — Encounter (HOSPITAL_COMMUNITY): Payer: Self-pay

## 2016-11-07 DIAGNOSIS — Z01818 Encounter for other preprocedural examination: Secondary | ICD-10-CM | POA: Insufficient documentation

## 2016-11-07 DIAGNOSIS — M1612 Unilateral primary osteoarthritis, left hip: Secondary | ICD-10-CM | POA: Insufficient documentation

## 2016-11-07 LAB — CBC
HEMATOCRIT: 44.3 % (ref 36.0–46.0)
HEMOGLOBIN: 15 g/dL (ref 12.0–15.0)
MCH: 31 pg (ref 26.0–34.0)
MCHC: 33.9 g/dL (ref 30.0–36.0)
MCV: 91.5 fL (ref 78.0–100.0)
Platelets: 279 10*3/uL (ref 150–400)
RBC: 4.84 MIL/uL (ref 3.87–5.11)
RDW: 12.8 % (ref 11.5–15.5)
WBC: 7.7 10*3/uL (ref 4.0–10.5)

## 2016-11-07 LAB — COMPREHENSIVE METABOLIC PANEL
ALK PHOS: 73 U/L (ref 38–126)
ALT: 11 U/L — AB (ref 14–54)
AST: 19 U/L (ref 15–41)
Albumin: 3.8 g/dL (ref 3.5–5.0)
Anion gap: 8 (ref 5–15)
BUN: 10 mg/dL (ref 6–20)
CALCIUM: 8.7 mg/dL — AB (ref 8.9–10.3)
CO2: 26 mmol/L (ref 22–32)
CREATININE: 0.76 mg/dL (ref 0.44–1.00)
Chloride: 108 mmol/L (ref 101–111)
GFR calc non Af Amer: >60 mL/min (ref >60)
GLUCOSE: 90 mg/dL (ref 65–99)
Potassium: 4 mmol/L (ref 3.5–5.1)
SODIUM: 142 mmol/L (ref 135–145)
Total Bilirubin: 0.4 mg/dL (ref 0.3–1.2)
Total Protein: 6.5 g/dL (ref 6.5–8.1)

## 2016-11-07 LAB — SURGICAL PCR SCREEN
MRSA, PCR: NEGATIVE
Staphylococcus aureus: NEGATIVE

## 2016-11-07 LAB — PROTIME-INR
INR: 0.95
Prothrombin Time: 12.5 seconds (ref 11.4–15.2)

## 2016-11-07 LAB — APTT: aPTT: 28 seconds (ref 24–36)

## 2016-11-07 LAB — ABO/RH: ABO/RH(D): O POS

## 2016-11-16 ENCOUNTER — Inpatient Hospital Stay (HOSPITAL_COMMUNITY): Payer: Medicare Other

## 2016-11-16 ENCOUNTER — Inpatient Hospital Stay (HOSPITAL_COMMUNITY)
Admission: RE | Admit: 2016-11-16 | Discharge: 2016-11-17 | DRG: 470 | Disposition: A | Discharge status: Home Health | Payer: Medicare Other | Source: Ambulatory Visit | Attending: Orthopedic Surgery | Admitting: Orthopedic Surgery

## 2016-11-16 ENCOUNTER — Encounter (HOSPITAL_COMMUNITY)
Admission: RE | Disposition: A | Discharge status: Home Health | Payer: Self-pay | Source: Ambulatory Visit | Attending: Orthopedic Surgery

## 2016-11-16 ENCOUNTER — Encounter (HOSPITAL_COMMUNITY): Payer: Self-pay

## 2016-11-16 ENCOUNTER — Inpatient Hospital Stay (HOSPITAL_COMMUNITY): Payer: Medicare Other | Admitting: Anesthesiology

## 2016-11-16 DIAGNOSIS — M069 Rheumatoid arthritis, unspecified: Secondary | ICD-10-CM | POA: Diagnosis present

## 2016-11-16 DIAGNOSIS — M810 Age-related osteoporosis without current pathological fracture: Secondary | ICD-10-CM | POA: Diagnosis not present

## 2016-11-16 DIAGNOSIS — L409 Psoriasis, unspecified: Secondary | ICD-10-CM | POA: Diagnosis not present

## 2016-11-16 DIAGNOSIS — Z888 Allergy status to other drugs, medicaments and biological substances status: Secondary | ICD-10-CM

## 2016-11-16 DIAGNOSIS — F17210 Nicotine dependence, cigarettes, uncomplicated: Secondary | ICD-10-CM | POA: Diagnosis not present

## 2016-11-16 DIAGNOSIS — F329 Major depressive disorder, single episode, unspecified: Secondary | ICD-10-CM | POA: Diagnosis present

## 2016-11-16 DIAGNOSIS — E785 Hyperlipidemia, unspecified: Secondary | ICD-10-CM | POA: Diagnosis not present

## 2016-11-16 DIAGNOSIS — M169 Osteoarthritis of hip, unspecified: Secondary | ICD-10-CM | POA: Diagnosis present

## 2016-11-16 DIAGNOSIS — Z886 Allergy status to analgesic agent status: Secondary | ICD-10-CM | POA: Diagnosis not present

## 2016-11-16 DIAGNOSIS — J449 Chronic obstructive pulmonary disease, unspecified: Secondary | ICD-10-CM | POA: Diagnosis present

## 2016-11-16 DIAGNOSIS — E7800 Pure hypercholesterolemia, unspecified: Secondary | ICD-10-CM | POA: Diagnosis present

## 2016-11-16 DIAGNOSIS — M1612 Unilateral primary osteoarthritis, left hip: Secondary | ICD-10-CM | POA: Diagnosis not present

## 2016-11-16 DIAGNOSIS — Z96642 Presence of left artificial hip joint: Secondary | ICD-10-CM | POA: Diagnosis not present

## 2016-11-16 DIAGNOSIS — I700 Atherosclerosis of aorta: Secondary | ICD-10-CM | POA: Diagnosis not present

## 2016-11-16 DIAGNOSIS — Z471 Aftercare following joint replacement surgery: Secondary | ICD-10-CM | POA: Diagnosis not present

## 2016-11-16 DIAGNOSIS — Z96649 Presence of unspecified artificial hip joint: Secondary | ICD-10-CM

## 2016-11-16 DIAGNOSIS — M25552 Pain in left hip: Secondary | ICD-10-CM

## 2016-11-16 HISTORY — PX: TOTAL HIP ARTHROPLASTY: SHX124

## 2016-11-16 LAB — TYPE AND SCREEN
ABO/RH(D): O POS
Antibody Screen: NEGATIVE

## 2016-11-16 SURGERY — ARTHROPLASTY, HIP, TOTAL, ANTERIOR APPROACH
Anesthesia: Spinal | Site: Hip | Laterality: Left

## 2016-11-16 MED ORDER — HYDROMORPHONE HCL-NACL 0.5-0.9 MG/ML-% IV SOSY: 0.2500 mg | PREFILLED_SYRINGE | INTRAVENOUS | Status: DC | PRN

## 2016-11-16 MED ORDER — DEXAMETHASONE SODIUM PHOSPHATE 10 MG/ML IJ SOLN
10.0000 mg | Freq: Once | INTRAMUSCULAR | Status: AC
  Administered 2016-11-16: 10 mg via INTRAVENOUS

## 2016-11-16 MED ORDER — CHLORHEXIDINE GLUCONATE 4 % EX LIQD
60.0000 mL | Freq: Once | CUTANEOUS | Status: DC
  Administered 2016-11-16: 4 via TOPICAL

## 2016-11-16 MED ORDER — DEXAMETHASONE SODIUM PHOSPHATE 10 MG/ML IJ SOLN
10.0000 mg | Freq: Once | INTRAMUSCULAR | Status: AC
  Administered 2016-11-17: 10 mg via INTRAVENOUS
  Filled 2016-11-16: qty 1

## 2016-11-16 MED ORDER — PHENYLEPHRINE HCL 10 MG/ML IJ SOLN
INTRAMUSCULAR | Status: AC
  Filled 2016-11-16: qty 1

## 2016-11-16 MED ORDER — MENTHOL 3 MG MT LOZG: 1.0000 | LOZENGE | OROMUCOSAL | Status: DC | PRN

## 2016-11-16 MED ORDER — FENTANYL CITRATE (PF) 100 MCG/2ML IJ SOLN
INTRAMUSCULAR | Status: AC
  Filled 2016-11-16: qty 2

## 2016-11-16 MED ORDER — BISACODYL 10 MG RE SUPP: 10.0000 mg | Freq: Every day | RECTAL | Status: DC | PRN

## 2016-11-16 MED ORDER — METHOCARBAMOL 1000 MG/10ML IJ SOLN
500.0000 mg | Freq: Four times a day (QID) | INTRAVENOUS | Status: DC | PRN
  Administered 2016-11-16: 500 mg via INTRAVENOUS
  Filled 2016-11-16: qty 550

## 2016-11-16 MED ORDER — PHENOL 1.4 % MT LIQD: 1.0000 | OROMUCOSAL | Status: DC | PRN

## 2016-11-16 MED ORDER — SODIUM CHLORIDE 0.9 % IV SOLN
INTRAVENOUS | Status: DC
  Administered 2016-11-16: 17:00:00 via INTRAVENOUS

## 2016-11-16 MED ORDER — POLYETHYLENE GLYCOL 3350 17 G PO PACK: 17.0000 g | PACK | Freq: Every day | ORAL | Status: DC | PRN

## 2016-11-16 MED ORDER — DIPHENHYDRAMINE HCL 12.5 MG/5ML PO ELIX: 12.5000 mg | ORAL_SOLUTION | ORAL | Status: DC | PRN

## 2016-11-16 MED ORDER — CEFAZOLIN SODIUM-DEXTROSE 2-4 GM/100ML-% IV SOLN
2.0000 g | INTRAVENOUS | Status: AC
  Administered 2016-11-16: 2 g via INTRAVENOUS

## 2016-11-16 MED ORDER — ONDANSETRON HCL 4 MG/2ML IJ SOLN
INTRAMUSCULAR | Status: DC | PRN
  Administered 2016-11-16: 4 mg via INTRAVENOUS

## 2016-11-16 MED ORDER — METOCLOPRAMIDE HCL 5 MG/ML IJ SOLN: 5.0000 mg | Freq: Three times a day (TID) | INTRAMUSCULAR | Status: DC | PRN

## 2016-11-16 MED ORDER — ONDANSETRON HCL 4 MG PO TABS: 4.0000 mg | ORAL_TABLET | Freq: Four times a day (QID) | ORAL | Status: DC | PRN

## 2016-11-16 MED ORDER — FLEET ENEMA 7-19 GM/118ML RE ENEM: 1.0000 | ENEMA | Freq: Once | RECTAL | Status: DC | PRN

## 2016-11-16 MED ORDER — CEFAZOLIN SODIUM-DEXTROSE 2-4 GM/100ML-% IV SOLN
INTRAVENOUS | Status: AC
  Filled 2016-11-16: qty 100

## 2016-11-16 MED ORDER — PROPOFOL 500 MG/50ML IV EMUL
INTRAVENOUS | Status: DC | PRN
  Administered 2016-11-16: 40 ug/kg/min via INTRAVENOUS

## 2016-11-16 MED ORDER — BUPIVACAINE HCL (PF) 0.5 % IJ SOLN
INTRAMUSCULAR | Status: AC
  Filled 2016-11-16: qty 30

## 2016-11-16 MED ORDER — BUPIVACAINE HCL (PF) 0.25 % IJ SOLN
INTRAMUSCULAR | Status: DC | PRN
  Administered 2016-11-16: 30 mL

## 2016-11-16 MED ORDER — PROPOFOL 10 MG/ML IV BOLUS
INTRAVENOUS | Status: AC
  Filled 2016-11-16: qty 60

## 2016-11-16 MED ORDER — ONDANSETRON HCL 4 MG/2ML IJ SOLN: 4.0000 mg | Freq: Once | INTRAMUSCULAR | Status: DC | PRN

## 2016-11-16 MED ORDER — OXYCODONE HCL 5 MG PO TABS: 5.0000 mg | ORAL_TABLET | ORAL | Status: DC | PRN

## 2016-11-16 MED ORDER — CEFAZOLIN SODIUM-DEXTROSE 1-4 GM/50ML-% IV SOLN
1.0000 g | Freq: Four times a day (QID) | INTRAVENOUS | Status: AC
  Administered 2016-11-16 (×2): 1 g via INTRAVENOUS
  Filled 2016-11-16 (×2): qty 50

## 2016-11-16 MED ORDER — ONDANSETRON HCL 4 MG/2ML IJ SOLN: 4.0000 mg | Freq: Four times a day (QID) | INTRAMUSCULAR | Status: DC | PRN

## 2016-11-16 MED ORDER — ONDANSETRON HCL 4 MG/2ML IJ SOLN
INTRAMUSCULAR | Status: AC
  Filled 2016-11-16: qty 2

## 2016-11-16 MED ORDER — RIVAROXABAN 10 MG PO TABS
10.0000 mg | ORAL_TABLET | Freq: Every day | ORAL | Status: DC
  Administered 2016-11-17: 10 mg via ORAL
  Filled 2016-11-16: qty 1

## 2016-11-16 MED ORDER — MEPERIDINE HCL 50 MG/ML IJ SOLN: 6.2500 mg | INTRAMUSCULAR | Status: DC | PRN

## 2016-11-16 MED ORDER — MORPHINE SULFATE (PF) 4 MG/ML IV SOLN
1.0000 mg | INTRAVENOUS | Status: DC | PRN
Start: 2016-11-16 — End: 2016-11-17

## 2016-11-16 MED ORDER — PHENYLEPHRINE HCL 10 MG/ML IJ SOLN
INTRAVENOUS | Status: DC | PRN
  Administered 2016-11-16: 40 ug/min via INTRAVENOUS

## 2016-11-16 MED ORDER — ACETAMINOPHEN 325 MG PO TABS: 650.0000 mg | ORAL_TABLET | Freq: Four times a day (QID) | ORAL | Status: DC | PRN

## 2016-11-16 MED ORDER — TRAMADOL HCL 50 MG PO TABS
50.0000 mg | ORAL_TABLET | Freq: Four times a day (QID) | ORAL | Status: DC | PRN
  Administered 2016-11-16 – 2016-11-17 (×3): 50 mg via ORAL
  Filled 2016-11-16 (×3): qty 1

## 2016-11-16 MED ORDER — DEXAMETHASONE SODIUM PHOSPHATE 10 MG/ML IJ SOLN
INTRAMUSCULAR | Status: AC
  Filled 2016-11-16: qty 1

## 2016-11-16 MED ORDER — 0.9 % SODIUM CHLORIDE (POUR BTL) OPTIME
TOPICAL | Status: DC | PRN
  Administered 2016-11-16: 1000 mL

## 2016-11-16 MED ORDER — ACETAMINOPHEN 650 MG RE SUPP: 650.0000 mg | Freq: Four times a day (QID) | RECTAL | Status: DC | PRN

## 2016-11-16 MED ORDER — TRANEXAMIC ACID 1000 MG/10ML IV SOLN
1000.0000 mg | INTRAVENOUS | Status: AC
  Administered 2016-11-16: 1000 mg via INTRAVENOUS
  Filled 2016-11-16: qty 1100

## 2016-11-16 MED ORDER — METHOCARBAMOL 500 MG PO TABS
500.0000 mg | ORAL_TABLET | Freq: Four times a day (QID) | ORAL | Status: DC | PRN
  Administered 2016-11-17: 500 mg via ORAL
  Filled 2016-11-16: qty 1

## 2016-11-16 MED ORDER — DOCUSATE SODIUM 100 MG PO CAPS
100.0000 mg | ORAL_CAPSULE | Freq: Two times a day (BID) | ORAL | Status: DC
  Administered 2016-11-16 – 2016-11-17 (×2): 100 mg via ORAL
  Filled 2016-11-16 (×2): qty 1

## 2016-11-16 MED ORDER — LACTATED RINGERS IV SOLN
INTRAVENOUS | Status: DC
  Administered 2016-11-16: 11:00:00 via INTRAVENOUS
  Administered 2016-11-16: 1000 mL via INTRAVENOUS

## 2016-11-16 MED ORDER — BUPIVACAINE HCL 0.25 % IJ SOLN
INTRAMUSCULAR | Status: AC
  Filled 2016-11-16: qty 1

## 2016-11-16 MED ORDER — FENTANYL CITRATE (PF) 100 MCG/2ML IJ SOLN
INTRAMUSCULAR | Status: DC | PRN
  Administered 2016-11-16: 50 ug via INTRAVENOUS

## 2016-11-16 MED ORDER — TRANEXAMIC ACID 1000 MG/10ML IV SOLN
1000.0000 mg | Freq: Once | INTRAVENOUS | Status: AC
  Administered 2016-11-16: 1000 mg via INTRAVENOUS
  Filled 2016-11-16: qty 1100

## 2016-11-16 MED ORDER — METOCLOPRAMIDE HCL 5 MG PO TABS: 5.0000 mg | ORAL_TABLET | Freq: Three times a day (TID) | ORAL | Status: DC | PRN

## 2016-11-16 MED ORDER — STERILE WATER FOR IRRIGATION IR SOLN
Status: DC | PRN
  Administered 2016-11-16: 2000 mL

## 2016-11-16 MED ORDER — ACETAMINOPHEN 10 MG/ML IV SOLN
1000.0000 mg | Freq: Once | INTRAVENOUS | Status: AC
  Administered 2016-11-16: 1000 mg via INTRAVENOUS

## 2016-11-16 MED ORDER — ACETAMINOPHEN 10 MG/ML IV SOLN
INTRAVENOUS | Status: AC
  Filled 2016-11-16: qty 100

## 2016-11-16 MED ORDER — ACETAMINOPHEN 500 MG PO TABS
1000.0000 mg | ORAL_TABLET | Freq: Four times a day (QID) | ORAL | Status: AC
  Administered 2016-11-16 – 2016-11-17 (×4): 1000 mg via ORAL
  Filled 2016-11-16 (×4): qty 2

## 2016-11-16 SURGICAL SUPPLY — 35 items
BAG DECANTER FOR FLEXI CONT (MISCELLANEOUS) ×1 IMPLANT
BAG SPEC THK2 15X12 ZIP CLS (MISCELLANEOUS) ×1
BAG ZIPLOCK 12X15 (MISCELLANEOUS) ×1 IMPLANT
BLADE SAG 18X100X1.27 (BLADE) ×2 IMPLANT
CAPT HIP TOTAL 2 ×1 IMPLANT
CLOTH BEACON ORANGE TIMEOUT ST (SAFETY) ×2 IMPLANT
COVER PERINEAL POST (MISCELLANEOUS) ×2 IMPLANT
COVER SURGICAL LIGHT HANDLE (MISCELLANEOUS) ×2 IMPLANT
DECANTER SPIKE VIAL GLASS SM (MISCELLANEOUS) ×2 IMPLANT
DRAPE STERI IOBAN 125X83 (DRAPES) ×2 IMPLANT
DRAPE U-SHAPE 47X51 STRL (DRAPES) ×4 IMPLANT
DRSG ADAPTIC 3X8 NADH LF (GAUZE/BANDAGES/DRESSINGS) ×2 IMPLANT
DRSG MEPILEX BORDER 4X4 (GAUZE/BANDAGES/DRESSINGS) ×2 IMPLANT
DRSG MEPILEX BORDER 4X8 (GAUZE/BANDAGES/DRESSINGS) ×2 IMPLANT
DURAPREP 26ML APPLICATOR (WOUND CARE) ×2 IMPLANT
ELECT REM PT RETURN 15FT ADLT (MISCELLANEOUS) ×2 IMPLANT
EVACUATOR 1/8 PVC DRAIN (DRAIN) ×2 IMPLANT
GLOVE BIO SURGEON STRL SZ7.5 (GLOVE) ×2 IMPLANT
GLOVE BIO SURGEON STRL SZ8 (GLOVE) ×3 IMPLANT
GLOVE BIOGEL PI IND STRL 8 (GLOVE) ×2 IMPLANT
GLOVE BIOGEL PI INDICATOR 8 (GLOVE) ×2
GOWN STRL REUS W/TWL LRG LVL3 (GOWN DISPOSABLE) ×2 IMPLANT
GOWN STRL REUS W/TWL XL LVL3 (GOWN DISPOSABLE) ×2 IMPLANT
PACK ANTERIOR HIP CUSTOM (KITS) ×2 IMPLANT
STRIP CLOSURE SKIN 1/2X4 (GAUZE/BANDAGES/DRESSINGS) ×3 IMPLANT
SUT ETHIBOND NAB CT1 #1 30IN (SUTURE) ×2 IMPLANT
SUT MNCRL AB 4-0 PS2 18 (SUTURE) ×2 IMPLANT
SUT STRATAFIX 0 PDS 27 VIOLET (SUTURE) ×2
SUT VIC AB 2-0 CT1 27 (SUTURE) ×6
SUT VIC AB 2-0 CT1 TAPERPNT 27 (SUTURE) ×2 IMPLANT
SUTURE STRATFX 0 PDS 27 VIOLET (SUTURE) ×1 IMPLANT
SYR 50ML LL SCALE MARK (SYRINGE) IMPLANT
TRAY FOLEY CATH 14FRSI W/METER (CATHETERS) ×1 IMPLANT
TRAY FOLEY W/METER SILVER 16FR (SET/KITS/TRAYS/PACK) ×1 IMPLANT
YANKAUER SUCT BULB TIP 10FT TU (MISCELLANEOUS) ×2 IMPLANT

## 2016-11-16 NOTE — Anesthesia Procedure Notes (Addendum)
Spinal  Patient location during procedure: OR Start time: 11/16/2016 9:29 AM End time: 11/16/2016 9:38 AM Reason for block: at surgeon's request Staffing Anesthesiologist: Lillia Abed Resident/CRNA: Anne Fu Performed: resident/CRNA and anesthesiologist  Preanesthetic Checklist Completed: patient identified, site marked, surgical consent, pre-op evaluation, timeout performed, IV checked, risks and benefits discussed and monitors and equipment checked Spinal Block Patient position: sitting Prep: DuraPrep Patient monitoring: heart rate, continuous pulse ox and blood pressure Approach: left paramedian Location: L2-3 Injection technique: single-shot Needle Needle type: Pencan  Needle gauge: 24 G Needle length: 9 cm Assessment Sensory level: T6 Additional Notes Expiration date of kit checked and confirmed. Patient tolerated procedure well, without complications. X 1 without success.  MD Rutilio Yellowhair X 1 left paramedian with noted clear CSF return. Loss of motor and sensory on exam post injection.

## 2016-11-16 NOTE — Anesthesia Postprocedure Evaluation (Signed)
Anesthesia Post Note  Patient: Tammy Neal  Procedure(s) Performed: Procedure(s) (LRB): LEFT TOTAL HIP ARTHROPLASTY ANTERIOR APPROACH (Left)     Patient location during evaluation: PACU Anesthesia Type: Spinal Level of consciousness: oriented and awake and alert Pain management: pain level controlled Vital Signs Assessment: post-procedure vital signs reviewed and stable Respiratory status: spontaneous breathing, respiratory function stable and patient connected to nasal cannula oxygen Cardiovascular status: blood pressure returned to baseline and stable Postop Assessment: no headache, no backache and no apparent nausea or vomiting Anesthetic complications: no    Last Vitals:  Vitals:   11/16/16 1300 11/16/16 1315  BP: (!) 105/57 113/60  Pulse: (!) 59 (!) 54  Resp: 10 12  Temp: (!) 36.3 C   SpO2: 99% 98%    Last Pain:  Vitals:   11/16/16 0803  TempSrc: Oral                 [name] DAVID

## 2016-11-16 NOTE — Evaluation (Signed)
Physical Therapy Evaluation Patient Details Name: Tammy Neal MRN: [id number] DOB: 08/25/1950 Today's Date: 11/16/2016   History of Present Illness  Pt s/p L THR  Clinical Impression  Pt s/p L THR and presents with decreased L LE strength/ROM and post op pain limiting functional mobility.  Pt should progress to dc home with family assist.    Follow Up Recommendations Home health PT;DC plan and follow up therapy as arranged by surgeon    Equipment Recommendations  None recommended by PT    Recommendations for Other Services       Precautions / Restrictions Precautions Precautions: Fall Restrictions Weight Bearing Restrictions: No Other Position/Activity Restrictions: WBAT      Mobility  Bed Mobility Overal bed mobility: Needs Assistance Bed Mobility: Supine to Sit     Supine to sit: Min assist     General bed mobility comments: cues for sequence and use of R LE to self assist  Transfers Overall transfer level: Needs assistance Equipment used: Rolling walker (2 wheeled) Transfers: Sit to/from Stand Sit to Stand: Min assist         General transfer comment: cues for LE management and use of UEs to self assist  Ambulation/Gait Ambulation/Gait assistance: Min assist Ambulation Distance (Feet): 52 Feet Assistive device: Rolling walker (2 wheeled) Gait Pattern/deviations: Step-to pattern;Decreased step length - right;Decreased step length - left;Shuffle;Trunk flexed Gait velocity: decr Gait velocity interpretation: Below normal speed for age/gender General Gait Details: cues for sequence, posture and position from [employer]            Wheelchair Mobility    Modified Rankin (Stroke Patients Only)       Balance                                             Pertinent Vitals/Pain Pain Assessment: 0-10 Pain Score: 4  Pain Location: L hip Pain Descriptors / Indicators: Aching;Sore Pain Intervention(s): Limited activity within  patient's tolerance;Monitored during session;Premedicated before session;Ice applied    Home Living Family/patient expects to be discharged to:: Private residence Living Arrangements: Other relatives Available Help at Discharge: Family Type of Home: House Home Access: Stairs to enter Entrance Stairs-Rails: Right Entrance Stairs-Number of Steps: 3 Home Layout: One level Home Equipment: [place] - single point;Walker - 2 wheels;Bedside commode;Shower seat      Prior Function Level of Independence: Independent               Hand Dominance        Extremity/Trunk Assessment   Upper Extremity Assessment Upper Extremity Assessment: Overall WFL for tasks assessed    Lower Extremity Assessment Lower Extremity Assessment: LLE deficits/detail    Cervical / Trunk Assessment Cervical / Trunk Assessment: Normal  Communication   Communication: No difficulties  Cognition Arousal/Alertness: Awake/alert Behavior During Therapy: WFL for tasks assessed/performed Overall Cognitive Status: Within Functional Limits for tasks assessed                                        General Comments      Exercises Total Joint Exercises Ankle Circles/Pumps: AROM;Both;15 reps;Supine   Assessment/Plan    PT Assessment Patient needs continued PT services  PT Problem List Decreased strength;Decreased range of motion;Decreased activity tolerance;Decreased mobility;Decreased knowledge of use of DME;Pain  PT Treatment Interventions DME instruction;Gait training;Stair training;Functional mobility training;Therapeutic activities;Therapeutic exercise;Patient/family education    PT Goals (Current goals can be found in the Care Plan section)  Acute Rehab PT Goals Patient Stated Goal: Regain IND and resume previous lifestyle PT Goal Formulation: With patient Time For Goal Achievement: 11/19/16 Potential to Achieve Goals: Good    Frequency 7X/week   Barriers to discharge         Co-evaluation               AM-PAC PT "6 Clicks" Daily Activity  Outcome Measure Difficulty turning over in bed (including adjusting bedclothes, sheets and blankets)?: Unable Difficulty moving from lying on back to sitting on the side of the bed? : Unable Difficulty sitting down on and standing up from a chair with arms (e.g., wheelchair, bedside commode, etc,.)?: Unable Help needed moving to and from a bed to chair (including a wheelchair)?: A Little Help needed walking in hospital room?: A Little Help needed climbing 3-5 steps with a railing? : A Little 6 Click Score: 12    End of Session Equipment Utilized During Treatment: Gait belt Activity Tolerance: Patient tolerated treatment well Patient left: in chair;with call bell/phone within reach;with family/visitor present Nurse Communication: Mobility status PT Visit Diagnosis: Unsteadiness on feet (R26.81);Difficulty in walking, not elsewhere classified (R26.2)    Time: 2993-7169 PT Time Calculation (min) (ACUTE ONLY): 27 min   Charges:     PT Treatments $Gait Training: 8-22 mins   PT G Codes:        Pg [phone]   Aedyn Kempfer 11/16/2016, 6:03 PM

## 2016-11-16 NOTE — Discharge Instructions (Addendum)
° °Dr. Frank Aluisio °Total Joint Specialist °[facility] °[address] °[place], [place] [address] °[phone] ° °ANTERIOR APPROACH TOTAL HIP REPLACEMENT POSTOPERATIVE DIRECTIONS ° ° °Hip Rehabilitation, Guidelines Following Surgery  °The results of a hip operation are greatly improved after range of motion and muscle strengthening exercises. Follow all safety measures which are given to protect your hip. If any of these exercises cause increased pain or swelling in your joint, decrease the amount until you are comfortable again. Then slowly increase the exercises. Call your caregiver if you have problems or questions.  ° °HOME CARE INSTRUCTIONS  °Remove items at home which could result in a fall. This includes throw rugs or furniture in walking pathways.  °· ICE to the affected hip every three hours for 30 minutes at a time and then as needed for pain and swelling.  Continue to use ice on the hip for pain and swelling from surgery. You may notice swelling that will progress down to the foot and ankle.  This is normal after surgery.  Elevate the leg when you are not up walking on it.   °· Continue to use the breathing machine which will help keep your temperature down.  It is common for your temperature to cycle up and down following surgery, especially at night when you are not up moving around and exerting yourself.  The breathing machine keeps your lungs expanded and your temperature down. ° ° °DIET °You may resume your previous home diet once your are discharged from the hospital. ° °DRESSING / WOUND CARE / SHOWERING °You may shower 3 days after surgery, but keep the wounds dry during showering.  You may use an occlusive plastic wrap (Press'n Seal for example), NO SOAKING/SUBMERGING IN THE BATHTUB.  If the bandage gets wet, change with a clean dry gauze.  If the incision gets wet, pat the wound dry with a clean towel. °You may start showering once you are discharged home but do not  submerge the incision under water. Just pat the incision dry and apply a dry gauze dressing on daily. °Change the surgical dressing daily and reapply a dry dressing each time. ° °ACTIVITY °Walk with your walker as instructed. °Use walker as long as suggested by your caregivers. °Avoid periods of inactivity such as sitting longer than an hour when not asleep. This helps prevent blood clots.  °You may resume a sexual relationship in one month or when given the OK by your doctor.  °You may return to work once you are cleared by your doctor.  °Do not drive a car for 6 weeks or until released by you surgeon.  °Do not drive while taking narcotics. ° °WEIGHT BEARING °Weight bearing as tolerated with assist device (walker, cane, etc) as directed, use it as long as suggested by your surgeon or therapist, typically at least 4-6 weeks. ° °POSTOPERATIVE CONSTIPATION PROTOCOL °Constipation - defined medically as fewer than three stools per week and severe constipation as less than one stool per week. ° °One of the most common issues patients have following surgery is constipation.  Even if you have a regular bowel pattern at home, your normal regimen is likely to be disrupted due to multiple reasons following surgery.  Combination of anesthesia, postoperative narcotics, change in appetite and fluid intake all can affect your bowels.  In order to avoid complications following surgery, here are some recommendations in order to help you during your recovery period. ° °Colace (docusate) - Pick up an over-the-counter   form of Colace or another stool softener and take twice a day as long as you are requiring postoperative pain medications.  Take with a full glass of water daily.  If you experience loose stools or diarrhea, hold the colace until you stool forms back up.  If your symptoms do not get better within 1 week or if they get worse, check with your doctor. ° °Dulcolax (bisacodyl) - Pick up over-the-counter and take as directed  by the product packaging as needed to assist with the movement of your bowels.  Take with a full glass of water.  Use this product as needed if not relieved by Colace only.  ° °MiraLax (polyethylene glycol) - Pick up over-the-counter to have on hand.  MiraLax is a solution that will increase the amount of water in your bowels to assist with bowel movements.  Take as directed and can mix with a glass of water, juice, soda, coffee, or tea.  Take if you go more than two days without a movement. °Do not use MiraLax more than once per day. Call your doctor if you are still constipated or irregular after using this medication for 7 days in a row. ° °If you continue to have problems with postoperative constipation, please contact the office for further assistance and recommendations.  If you experience "the worst abdominal pain ever" or develop nausea or vomiting, please contact the office immediatly for further recommendations for treatment. ° °ITCHING ° If you experience itching with your medications, try taking only a single pain pill, or even half a pain pill at a time.  You can also use Benadryl over the counter for itching or also to help with sleep.  ° °TED HOSE STOCKINGS °Wear the elastic stockings on both legs for three weeks following surgery during the day but you may remove then at night for sleeping. ° °MEDICATIONS °See your medication summary on the “After Visit Summary” that the nursing staff will review with you prior to discharge.  You may have some home medications which will be placed on hold until you complete the course of blood thinner medication.  It is important for you to complete the blood thinner medication as prescribed by your surgeon.  Continue your approved medications as instructed at time of discharge. ° °PRECAUTIONS °If you experience chest pain or shortness of breath - call 911 immediately for transfer to the hospital emergency department.  °If you develop a fever greater that 101 F,  purulent drainage from wound, increased redness or drainage from wound, foul odor from the wound/dressing, or calf pain - CONTACT YOUR SURGEON.   °                                                °FOLLOW-UP APPOINTMENTS °Make sure you keep all of your appointments after your operation with your surgeon and caregivers. You should call the office at the above phone number and make an appointment for approximately two weeks after the date of your surgery or on the date instructed by your surgeon outlined in the "After Visit Summary". ° °RANGE OF MOTION AND STRENGTHENING EXERCISES  °These exercises are designed to help you keep full movement of your hip joint. Follow your caregiver's or physical therapist's instructions. Perform all exercises about fifteen times, three times per day or as directed. Exercise both hips, even if you   have had only one joint replacement. These exercises can be done on a training (exercise) mat, on the floor, on a table or on a bed. Use whatever works the best and is most comfortable for you. Use music or television while you are exercising so that the exercises are a pleasant break in your day. This will make your life better with the exercises acting as a break in routine you can look forward to.  °Lying on your back, slowly slide your foot toward your buttocks, raising your knee up off the floor. Then slowly slide your foot back down until your leg is straight again.  °Lying on your back spread your legs as far apart as you can without causing discomfort.  °Lying on your side, raise your upper leg and foot straight up from the floor as far as is comfortable. Slowly lower the leg and repeat.  °Lying on your back, tighten up the muscle in the front of your thigh (quadriceps muscles). You can do this by keeping your leg straight and trying to raise your heel off the floor. This helps strengthen the largest muscle supporting your knee.  °Lying on your back, tighten up the muscles of your  buttocks both with the legs straight and with the knee bent at a comfortable angle while keeping your heel on the floor.  ° °IF YOU ARE TRANSFERRED TO A SKILLED REHAB FACILITY °If the patient is transferred to a skilled rehab facility following release from the hospital, a list of the current medications will be sent to the facility for the patient to continue.  When discharged from the skilled rehab facility, please have the facility set up the patient's Home Health Physical Therapy prior to being released. Also, the skilled facility will be responsible for providing the patient with their medications at time of release from the facility to include their pain medication, the muscle relaxants, and their blood thinner medication. If the patient is still at the rehab facility at time of the two week follow up appointment, the skilled rehab facility will also need to assist the patient in arranging follow up appointment in our office and any transportation needs. ° °MAKE SURE YOU:  °Understand these instructions.  °Get help right away if you are not doing well or get worse.  ° ° °Pick up stool softner and laxative for home use following surgery while on pain medications. °Do not submerge incision under water. °Please use good hand washing techniques while changing dressing each day. °May shower starting three days after surgery. °Please use a clean towel to pat the incision dry following showers. °Continue to use ice for pain and swelling after surgery. °Do not use any lotions or creams on the incision until instructed by your surgeon. ° °Take Xarelto for two and a half more weeks following discharge from the hospital, then discontinue Xarelto. °Once the patient has completed the blood thinner regimen, then take a Baby 81 mg Aspirin daily for three more weeks. ° ° ° ° °Information on my medicine - XARELTO® (Rivaroxaban) ° °Why was Xarelto® prescribed for you? °Xarelto® was prescribed for you to reduce the risk of blood  clots forming after orthopedic surgery. The medical term for these abnormal blood clots is venous thromboembolism (VTE). ° °What do you need to know about xarelto® ? °Take your Xarelto® ONCE DAILY at the same time every day. °You may take it either with or without food. ° °If you have difficulty swallowing the tablet whole, you may   crush it and mix in applesauce just prior to taking your dose. ° °Take Xarelto® exactly as prescribed by your doctor and DO NOT stop taking Xarelto® without talking to the doctor who prescribed the medication.  Stopping without other VTE prevention medication to take the place of Xarelto® may increase your risk of developing a clot. ° °After discharge, you should have regular check-up appointments with your healthcare provider that is prescribing your Xarelto®.   ° °What do you do if you miss a dose? °If you miss a dose, take it as soon as you remember on the same day then continue your regularly scheduled once daily regimen the next day. Do not take two doses of Xarelto® on the same day.  ° °Important Safety Information °A possible side effect of Xarelto® is bleeding. You should call your healthcare provider right away if you experience any of the following: °? Bleeding from an injury or your nose that does not stop. °? Unusual colored urine (red or dark brown) or unusual colored stools (red or black). °? Unusual bruising for unknown reasons. °? A serious fall or if you hit your head (even if there is no bleeding). ° °Some medicines may interact with Xarelto® and might increase your risk of bleeding while on Xarelto®. To help avoid this, consult your healthcare provider or pharmacist prior to using any new prescription or non-prescription medications, including herbals, vitamins, non-steroidal anti-inflammatory drugs (NSAIDs) and supplements. ° °This website has more information on Xarelto®: www.xarelto.com. ° ° ° °

## 2016-11-16 NOTE — Anesthesia Preprocedure Evaluation (Signed)
Anesthesia Evaluation  Patient identified by MRN, date of birth, ID band Patient awake    Reviewed: Allergy & Precautions  Airway Mallampati: I  TM Distance: >3 FB Neck ROM: Full    Dental   Pulmonary COPD, Current Smoker,    Pulmonary exam normal        Cardiovascular Normal cardiovascular exam     Neuro/Psych Depression    GI/Hepatic   Endo/Other    Renal/GU      Musculoskeletal   Abdominal   Peds  Hematology   Anesthesia Other Findings   Reproductive/Obstetrics                             Anesthesia Physical Anesthesia Plan  ASA: II  Anesthesia Plan: Spinal   Post-op Pain Management:    Induction: Intravenous  PONV Risk Score and Plan: 1 and Ondansetron and Dexamethasone  Airway Management Planned: Simple Face Mask  Additional Equipment:   Intra-op Plan:   Post-operative Plan:   Informed Consent: I have reviewed the patients History and Physical, chart, labs and discussed the procedure including the risks, benefits and alternatives for the proposed anesthesia with the patient or authorized representative who has indicated his/her understanding and acceptance.     Plan Discussed with: Surgeon and CRNA  Anesthesia Plan Comments:         Anesthesia Quick Evaluation

## 2016-11-16 NOTE — Transfer of Care (Signed)
Immediate Anesthesia Transfer of Care Note  Patient: Tammy Neal  Procedure(s) Performed: Procedure(s): LEFT TOTAL HIP ARTHROPLASTY ANTERIOR APPROACH (Left)  Patient Location: PACU  Anesthesia Type:Spinal  Level of Consciousness:  sedated, patient cooperative and responds to stimulation  Airway & Oxygen Therapy:Patient Spontanous Breathing and Patient connected to face mask oxgen  Post-op Assessment:  Report given to PACU RN and Post -op Vital signs reviewed and stable  Post vital signs:  Reviewed and stable  Last Vitals:  Vitals:   11/16/16 0803  BP: (!) 155/80  Pulse: 77  Resp: 18  Temp: 36.7 C  SpO2: 03%    Complications: No apparent anesthesia complications

## 2016-11-16 NOTE — Op Note (Signed)
OPERATIVE REPORT- TOTAL HIP ARTHROPLASTY   PREOPERATIVE DIAGNOSIS: Osteoarthritis of the Left hip.   POSTOPERATIVE DIAGNOSIS: Osteoarthritis of the Left  hip.   PROCEDURE: Left total hip arthroplasty, anterior approach.   SURGEON: Gaynelle Arabian, MD   ASSISTANT: Arlee Muslim, PA-C  ANESTHESIA:  Spinal  ESTIMATED BLOOD LOSS:-300 ml   DRAINS: Hemovac x1.   COMPLICATIONS: None   CONDITION: PACU - hemodynamically stable.   BRIEF CLINICAL NOTE: Tammy Neal is a 66 y.o. female who has advanced end-  stage arthritis of their Left  hip with progressively worsening pain and  dysfunction.The patient has failed nonoperative management and presents for  total hip arthroplasty.   PROCEDURE IN DETAIL: After successful administration of spinal  anesthetic, the traction boots for the [facility] bed were placed on both  feet and the patient was placed onto the [facility] bed, boots placed into the leg  holders. The Left hip was then isolated from the perineum with plastic  drapes and prepped and draped in the usual sterile fashion. ASIS and  greater trochanter were marked and a oblique incision was made, starting  at about 1 cm lateral and 2 cm distal to the ASIS and coursing towards  the anterior cortex of the femur. The skin was cut with a 10 blade  through subcutaneous tissue to the level of the fascia overlying the  tensor fascia lata muscle. The fascia was then incised in line with the  incision at the junction of the anterior third and posterior 2/3rd. The  muscle was teased off the fascia and then the interval between the TFL  and the rectus was developed. The Hohmann retractor was then placed at  the top of the femoral neck over the capsule. The vessels overlying the  capsule were cauterized and the fat on top of the capsule was removed.  A Hohmann retractor was then placed anterior underneath the rectus  femoris to give exposure to the entire anterior capsule. A T-shaped   capsulotomy was performed. The edges were tagged and the femoral head  was identified.       Osteophytes are removed off the superior acetabulum.  The femoral neck was then cut in situ with an oscillating saw. Traction  was then applied to the left lower extremity utilizing the [facility]  traction. The femoral head was then removed. Retractors were placed  around the acetabulum and then circumferential removal of the labrum was  performed. Osteophytes were also removed. Reaming starts at 45 mm to  medialize and  Increased in 2 mm increments to 47 mm. We reamed in  approximately 40 degrees of abduction, 20 degrees anteversion. A 48 mm  pinnacle acetabular shell was then impacted in anatomic position under  fluoroscopic guidance with excellent purchase. We did not need to place  any additional dome screws. A 28 mm neutral + 4 marathon liner was then  placed into the acetabular shell.       The femoral lift was then placed along the lateral aspect of the femur  just distal to the vastus ridge. The leg was  externally rotated and capsule  was stripped off the inferior aspect of the femoral neck down to the  level of the lesser trochanter, this was done with electrocautery. The femur was lifted after this was performed. The  leg was then placed in an extended and adducted position essentially delivering the femur. We also removed the capsule superiorly and the piriformis from the piriformis fossa  to gain excellent exposure of the  proximal femur. Rongeur was used to remove some cancellous bone to get  into the lateral portion of the proximal femur for placement of the  initial starter reamer. The starter broaches was placed  the starter broach  and was shown to go down the center of the canal. Broaching  with the  Corail system was then performed starting at size 8, coursing  Up to size 10. A size 10 had excellent torsional and rotational  and axial stability. The trial standard offset neck was then  placed  with a 28 + 1.5 trial head. The hip was then reduced. We confirmed that  the stem was in the canal both on AP and lateral x-rays. It also has excellent sizing. The hip was reduced with outstanding stability through full extension and full external rotation.. AP pelvis was taken and the leg lengths were measured and found to be equal. Hip was then dislocated again and the femoral head and neck removed. The  femoral broach was removed. Size 10 Corail stem with a standard offset  neck was then impacted into the femur following native anteversion. Has  excellent purchase in the canal. Excellent torsional and rotational and  axial stability. It is confirmed to be in the canal on AP and lateral  fluoroscopic views. The 28 + 1.5 ceramic head was placed and the hip  reduced with outstanding stability. Again AP pelvis was taken and it  confirmed that the leg lengths were equal. The wound was then copiously  irrigated with saline solution and the capsule reattached and repaired  with Ethibond suture. 30 ml of .25% Bupivicaine was  injected into the capsule and into the edge of the tensor fascia lata as well as subcutaneous tissue. The fascia overlying the tensor fascia lata was then closed with a running #1 V-Loc. Subcu was closed with interrupted 2-0 Vicryl and subcuticular running 4-0 Monocryl. Incision was cleaned  and dried. Steri-Strips and a bulky sterile dressing applied. Hemovac  drain was hooked to suction and then the patient was awakened and transported to  recovery in stable condition.        Please note that a surgical assistant was a medical necessity for this procedure to perform it in a safe and expeditious manner. Assistant was necessary to provide appropriate retraction of vital neurovascular structures and to prevent femoral fracture and allow for anatomic placement of the prosthesis.  Gaynelle Arabian, M.D.

## 2016-11-17 ENCOUNTER — Encounter (HOSPITAL_COMMUNITY): Payer: Self-pay | Admitting: Orthopedic Surgery

## 2016-11-17 LAB — BASIC METABOLIC PANEL
ANION GAP: 8 (ref 5–15)
BUN: 8 mg/dL (ref 6–20)
CALCIUM: 8.3 mg/dL — AB (ref 8.9–10.3)
CO2: 23 mmol/L (ref 22–32)
Chloride: 107 mmol/L (ref 101–111)
Creatinine, Ser: 0.57 mg/dL (ref 0.44–1.00)
Glucose, Bld: 122 mg/dL — ABNORMAL HIGH (ref 65–99)
Potassium: 4 mmol/L (ref 3.5–5.1)
Sodium: 138 mmol/L (ref 135–145)

## 2016-11-17 LAB — CBC
HEMATOCRIT: 37.4 % (ref 36.0–46.0)
Hemoglobin: 12.4 g/dL (ref 12.0–15.0)
MCH: 31.2 pg (ref 26.0–34.0)
MCHC: 33.2 g/dL (ref 30.0–36.0)
MCV: 94 fL (ref 78.0–100.0)
PLATELETS: 223 10*3/uL (ref 150–400)
RBC: 3.98 MIL/uL (ref 3.87–5.11)
RDW: 13 % (ref 11.5–15.5)
WBC: 19.5 10*3/uL — AB (ref 4.0–10.5)

## 2016-11-17 MED ORDER — METHOCARBAMOL 500 MG PO TABS: 500.0000 mg | ORAL_TABLET | Freq: Four times a day (QID) | ORAL | 0 refills | Status: DC | PRN

## 2016-11-17 MED ORDER — OXYCODONE HCL 5 MG PO TABS: 5.0000 mg | ORAL_TABLET | ORAL | 0 refills | Status: DC | PRN

## 2016-11-17 MED ORDER — TRAMADOL HCL 50 MG PO TABS: 50.0000 mg | ORAL_TABLET | Freq: Four times a day (QID) | ORAL | 0 refills | Status: DC | PRN

## 2016-11-17 MED ORDER — RIVAROXABAN 10 MG PO TABS: 10.0000 mg | ORAL_TABLET | Freq: Every day | ORAL | 0 refills | Status: DC

## 2016-11-17 NOTE — Progress Notes (Signed)
   Subjective: 1 Day Post-Op Procedure(s) (LRB): LEFT TOTAL HIP ARTHROPLASTY ANTERIOR APPROACH (Left) Patient reports pain as mild.   Patient seen in rounds with Dr. Wynelle Link. Patient is well, and has had no acute complaints or problems We will resume therapy today.  Walked over 66 feet yesterday following surgery. If they do well with therapy and meets all goals, then will allow home later this afternoon following therapy. Plan is to go Home after hospital stay.  Objective: Vital signs in last 24 hours: Temp:  [96.5 F (35.8 C)-98.5 F (36.9 C)] 98.5 F (36.9 C) (09/27 0224) Pulse Rate:  [54-77] 68 (09/27 0224) Resp:  [10-18] 16 (09/27 0224) BP: (96-155)/(50-80) 118/54 (09/27 0224) SpO2:  [98 %-100 %] 99 % (09/27 0224) Weight:  [54.9 kg (121 lb)] 54.9 kg (121 lb) (09/26 0818)  Intake/Output from previous day:  Intake/Output Summary (Last 24 hours) at 11/17/16 0801 Last data filed at 11/17/16 0600  Gross per 24 hour  Intake             3611 ml  Output             2340 ml  Net             1271 ml    Intake/Output this shift: No intake/output data recorded.  Labs:  Recent Labs  11/17/16 0636  HGB 12.4    Recent Labs  11/17/16 0636  WBC 19.5*  RBC 3.98  HCT 37.4  PLT 223    Recent Labs  11/17/16 0636  NA 138  K 4.0  CL 107  CO2 23  BUN 8  CREATININE 0.57  GLUCOSE 122*  CALCIUM 8.3*   No results for input(s): LABPT, INR in the last 72 hours.  EXAM General - Patient is Alert and Appropriate Extremity - Neurovascular intact Sensation intact distally Dressing - dressing C/D/I Motor Function - intact, moving foot and toes well on exam.  Hemovac pulled without difficulty.  Past Medical History:  Diagnosis Date  . Allergy    allergic rhinitis  . Arthritis   . COPD (chronic obstructive pulmonary disease) ([place])   . Hyperlipidemia    no meds  . Neck pain, chronic    with fusion  . Osteoporosis   . Psoriasis     Assessment/Plan: 1 Day Post-Op  Procedure(s) (LRB): LEFT TOTAL HIP ARTHROPLASTY ANTERIOR APPROACH (Left) Active Problems:   OA (osteoarthritis) of hip  Estimated body mass index is 24.44 kg/m as calculated from the following:   Height as of this encounter: 4\' 11"  (1.499 m).   Weight as of this encounter: 54.9 kg (121 lb). Advance diet Up with therapy  DVT Prophylaxis - Xarelto Weight Bearing As Tolerated left Leg Hemovac Pulled Begin Therapy  If meets goals and able to go home: Up with therapy Diet - Cardiac diet Follow up - in 2 weeks Activity - WBAT Disposition - Home Condition Upon Discharge - Stable at time of rounds, home after therapy goals D/C Meds - See DC Summary DVT Prophylaxis - Xarelto  Arlee Muslim, PA-C Orthopaedic Surgery 11/17/2016, 8:01 AM

## 2016-11-17 NOTE — Progress Notes (Signed)
Physical Therapy Treatment Patient Details Name: Tammy Neal MRN: [id number] DOB: Jan 28, 1951 Today's Date: 11/17/2016    History of Present Illness Pt s/p L THR    PT Comments    Pt with minimal pain and progressing well with mobility.  Pt hopeful for dc home this pm   Follow Up Recommendations  Home health PT;DC plan and follow up therapy as arranged by surgeon     Equipment Recommendations  None recommended by PT    Recommendations for Other Services       Precautions / Restrictions Precautions Precautions: Fall Restrictions Weight Bearing Restrictions: No Other Position/Activity Restrictions: WBAT    Mobility  Bed Mobility               General bed mobility comments: NT - pt up in chair and requests back to same  Transfers Overall transfer level: Needs assistance Equipment used: Rolling walker (2 wheeled) Transfers: Sit to/from Stand Sit to Stand: Min guard         General transfer comment: cues for LE management and use of UEs to self assist  Ambulation/Gait Ambulation/Gait assistance: Min guard;Min assist Ambulation Distance (Feet): 222 Feet Assistive device: Rolling walker (2 wheeled) Gait Pattern/deviations: Decreased step length - right;Decreased step length - left;Shuffle;Trunk flexed;Step-to pattern;Step-through pattern Gait velocity: decr Gait velocity interpretation: Below normal speed for age/gender General Gait Details: cues for sequence, posture and position from [employer]            Wheelchair Mobility    Modified Rankin (Stroke Patients Only)       Balance                                            Cognition Arousal/Alertness: Awake/alert Behavior During Therapy: WFL for tasks assessed/performed Overall Cognitive Status: Within Functional Limits for tasks assessed                                        Exercises Total Joint Exercises Ankle Circles/Pumps: AROM;Both;15  reps;Supine Quad Sets: AROM;Both;10 reps;Supine Heel Slides: AAROM;Left;20 reps;Supine Hip ABduction/ADduction: AAROM;Left;15 reps;Supine    General Comments        Pertinent Vitals/Pain Pain Assessment: 0-10 Pain Score: 3  Pain Location: L hip Pain Descriptors / Indicators: Aching;Sore Pain Intervention(s): Monitored during session;Limited activity within patient's tolerance;Premedicated before session;Ice applied    Home Living                      Prior Function            PT Goals (current goals can now be found in the care plan section) Acute Rehab PT Goals Patient Stated Goal: Regain IND and resume previous lifestyle PT Goal Formulation: With patient Time For Goal Achievement: 11/19/16 Potential to Achieve Goals: Good Progress towards PT goals: Progressing toward goals    Frequency    7X/week      PT Plan Current plan remains appropriate    Co-evaluation              AM-PAC PT "6 Clicks" Daily Activity  Outcome Measure  Difficulty turning over in bed (including adjusting bedclothes, sheets and blankets)?: Unable Difficulty moving from lying on back to sitting on the side of the bed? : Unable Difficulty  sitting down on and standing up from a chair with arms (e.g., wheelchair, bedside commode, etc,.)?: Unable Help needed moving to and from a bed to chair (including a wheelchair)?: A Little Help needed walking in hospital room?: A Little Help needed climbing 3-5 steps with a railing? : A Little 6 Click Score: 12    End of Session Equipment Utilized During Treatment: Gait belt Activity Tolerance: Patient tolerated treatment well Patient left: in chair;with call bell/phone within reach;with family/visitor present Nurse Communication: Mobility status PT Visit Diagnosis: Unsteadiness on feet (R26.81);Difficulty in walking, not elsewhere classified (R26.2)     Time: 3212-2482 PT Time Calculation (min) (ACUTE ONLY): 24 min  Charges:  $Gait  Training: 8-22 mins $Therapeutic Exercise: 8-22 mins                    G Codes:       Pg [phone]    Tyre Beaver 11/17/2016, 9:08 AM

## 2016-11-17 NOTE — Progress Notes (Signed)
Physical Therapy Treatment Patient Details Name: Tammy Neal MRN: [id number] DOB: Nov 11, 1950 Today's Date: 11/17/2016    History of Present Illness Pt s/p L THR    PT Comments    Pt progressing steadily with mobility.  Pt's dtr present and reviewed home therex, car transfers and stairs - written instruction provided.    Follow Up Recommendations  Home health PT;DC plan and follow up therapy as arranged by surgeon     Equipment Recommendations  None recommended by PT    Recommendations for Other Services       Precautions / Restrictions Precautions Precautions: Fall Restrictions Weight Bearing Restrictions: No Other Position/Activity Restrictions: WBAT    Mobility  Bed Mobility Overal bed mobility: Needs Assistance Bed Mobility: Sit to Supine       Sit to supine: Min guard   General bed mobility comments: cues for sequence and use of R LE to self assist  Transfers Overall transfer level: Needs assistance Equipment used: Rolling walker (2 wheeled) Transfers: Sit to/from Stand Sit to Stand: Supervision         General transfer comment: cues for LE management and use of UEs to self assist  Ambulation/Gait Ambulation/Gait assistance: Min guard;Supervision Ambulation Distance (Feet): 150 Feet Assistive device: Rolling walker (2 wheeled) Gait Pattern/deviations: Decreased step length - right;Decreased step length - left;Shuffle;Trunk flexed;Step-to pattern;Step-through pattern Gait velocity: decr Gait velocity interpretation: Below normal speed for age/gender General Gait Details: cues for sequence, posture and position from RW   Stairs Stairs: Yes   Stair Management: One rail Right;Step to pattern;Forwards;With cane Number of Stairs: 7 General stair comments: cues for sequence and foot/cane placement  Wheelchair Mobility    Modified Rankin (Stroke Patients Only)       Balance                                             Cognition Arousal/Alertness: Awake/alert Behavior During Therapy: WFL for tasks assessed/performed Overall Cognitive Status: Within Functional Limits for tasks assessed                                        Exercises Total Joint Exercises Ankle Circles/Pumps: AROM;Both;15 reps;Supine Quad Sets: AROM;Both;10 reps;Supine Heel Slides: AAROM;Left;20 reps;Supine Hip ABduction/ADduction: AAROM;Left;15 reps;Supine    General Comments        Pertinent Vitals/Pain Pain Assessment: 0-10 Pain Score: 3  Pain Location: L hip Pain Descriptors / Indicators: Aching;Sore Pain Intervention(s): Limited activity within patient's tolerance;Monitored during session;Premedicated before session;Ice applied    Home Living                      Prior Function            PT Goals (current goals can now be found in the care plan section) Acute Rehab PT Goals Patient Stated Goal: Regain IND and resume previous lifestyle PT Goal Formulation: With patient Time For Goal Achievement: 11/19/16 Potential to Achieve Goals: Good Progress towards PT goals: Progressing toward goals    Frequency    7X/week      PT Plan Current plan remains appropriate    Co-evaluation              AM-PAC PT "6 Clicks" Daily Activity  Outcome Measure  Difficulty turning  over in bed (including adjusting bedclothes, sheets and blankets)?: A Lot Difficulty moving from lying on back to sitting on the side of the bed? : A Lot Difficulty sitting down on and standing up from a chair with arms (e.g., wheelchair, bedside commode, etc,.)?: A Little Help needed moving to and from a bed to chair (including a wheelchair)?: A Little Help needed walking in hospital room?: A Little Help needed climbing 3-5 steps with a railing? : A Little 6 Click Score: 16    End of Session Equipment Utilized During Treatment: Gait belt Activity Tolerance: Patient tolerated treatment well Patient left: in  bed;with call bell/phone within reach;with family/visitor present Nurse Communication: Mobility status PT Visit Diagnosis: Unsteadiness on feet (R26.81);Difficulty in walking, not elsewhere classified (R26.2)     Time: 1497-0263 PT Time Calculation (min) (ACUTE ONLY): 32 min  Charges:  $Gait Training: 8-22 mins $Therapeutic Exercise: 8-22 mins                    G Codes:       Pg [phone]    Gillian Meeuwsen 11/17/2016, 5:19 PM

## 2016-11-17 NOTE — Progress Notes (Signed)
Discharge planning, spoke with patient and daughter at bedside. Have chosen [facility]. Contacted [facility] for referral. Has [place] and [address]. [phone]

## 2016-11-17 NOTE — Discharge Summary (Signed)
Physician Discharge Summary   Patient ID: Tammy Neal MRN: [id number] DOB/AGE: January 05, 1951 66 y.o.  Admit date: 11/16/2016 Discharge date: 11/17/2016  Primary Diagnosis:  Osteoarthritis of the Left hip.   Admission Diagnoses:  Past Medical History:  Diagnosis Date  . Allergy    allergic rhinitis  . Arthritis   . COPD (chronic obstructive pulmonary disease) ([place])   . Hyperlipidemia    no meds  . Neck pain, chronic    with fusion  . Osteoporosis   . Psoriasis    Discharge Diagnoses:   Active Problems:   OA (osteoarthritis) of hip  Estimated body mass index is 24.44 kg/m as calculated from the following:   Height as of this encounter: _0  (1.499 m).   Weight as of this encounter: 54.9 kg (121 lb).  Procedure(s) (LRB): LEFT TOTAL HIP ARTHROPLASTY ANTERIOR APPROACH (Left)   Consults: None  HPI: Tammy Neal is a 66 y.o. female who has advanced end-  stage arthritis of their Left  hip with progressively worsening pain and  dysfunction.The patient has failed nonoperative management and presents for  total hip arthroplasty.   Laboratory Data: Admission on 11/16/2016  Component Date Value Ref Range Status  . WBC 11/17/2016 19.5* 4.0 - 10.5 K/uL Final  . RBC 11/17/2016 3.98  3.87 - 5.11 MIL/uL Final  . Hemoglobin 11/17/2016 12.4  12.0 - 15.0 g/dL Final  . HCT 11/17/2016 37.4  36.0 - 46.0 % Final  . MCV 11/17/2016 94.0  78.0 - 100.0 fL Final  . MCH 11/17/2016 31.2  26.0 - 34.0 pg Final  . MCHC 11/17/2016 33.2  30.0 - 36.0 g/dL Final  . RDW 11/17/2016 13.0  11.5 - 15.5 % Final  . Platelets 11/17/2016 223  150 - 400 K/uL Final  . Sodium 11/17/2016 138  135 - 145 mmol/L Final  . Potassium 11/17/2016 4.0  3.5 - 5.1 mmol/L Final  . Chloride 11/17/2016 107  101 - 111 mmol/L Final  . CO2 11/17/2016 23  22 - 32 mmol/L Final  . Glucose, Bld 11/17/2016 122* 65 - 99 mg/dL Final  . BUN 11/17/2016 8  6 - 20 mg/dL Final  . Creatinine, Ser 11/17/2016 0.57  0.44 - 1.00 mg/dL  Final  . Calcium 11/17/2016 8.3* 8.9 - 10.3 mg/dL Final  . GFR calc non Af Amer 11/17/2016 >60  >60 mL/min Final  . GFR calc Af Amer 11/17/2016 >60  >60 mL/min Final   Comment: (NOTE) The eGFR has been calculated using the CKD EPI equation. This calculation has not been validated in all clinical situations. eGFR's persistently <60 mL/min signify possible Chronic Kidney Disease.   Georgiann Hahn gap 11/17/2016 8  5 - 15 Final  Hospital Outpatient Visit on 11/07/2016  Component Date Value Ref Range Status  . aPTT 11/07/2016 28  24 - 36 seconds Final  . WBC 11/07/2016 7.7  4.0 - 10.5 K/uL Final  . RBC 11/07/2016 4.84  3.87 - 5.11 MIL/uL Final  . Hemoglobin 11/07/2016 15.0  12.0 - 15.0 g/dL Final  . HCT 11/07/2016 44.3  36.0 - 46.0 % Final  . MCV 11/07/2016 91.5  78.0 - 100.0 fL Final  . MCH 11/07/2016 31.0  26.0 - 34.0 pg Final  . MCHC 11/07/2016 33.9  30.0 - 36.0 g/dL Final  . RDW 11/07/2016 12.8  11.5 - 15.5 % Final  . Platelets 11/07/2016 279  150 - 400 K/uL Final  . Sodium 11/07/2016 142  135 - 145 mmol/L Final  .  Potassium 11/07/2016 4.0  3.5 - 5.1 mmol/L Final  . Chloride 11/07/2016 108  101 - 111 mmol/L Final  . CO2 11/07/2016 26  22 - 32 mmol/L Final  . Glucose, Bld 11/07/2016 90  65 - 99 mg/dL Final  . BUN 11/07/2016 10  6 - 20 mg/dL Final  . Creatinine, Ser 11/07/2016 0.76  0.44 - 1.00 mg/dL Final  . Calcium 11/07/2016 8.7* 8.9 - 10.3 mg/dL Final  . Total Protein 11/07/2016 6.5  6.5 - 8.1 g/dL Final  . Albumin 11/07/2016 3.8  3.5 - 5.0 g/dL Final  . AST 11/07/2016 19  15 - 41 U/L Final  . ALT 11/07/2016 11* 14 - 54 U/L Final  . Alkaline Phosphatase 11/07/2016 73  38 - 126 U/L Final  . Total Bilirubin 11/07/2016 0.4  0.3 - 1.2 mg/dL Final  . GFR calc non Af Amer 11/07/2016 >60  >60 mL/min Final  . GFR calc Af Amer 11/07/2016 >60  >60 mL/min Final   Comment: (NOTE) The eGFR has been calculated using the CKD EPI equation. This calculation has not been validated in all clinical  situations. eGFR's persistently <60 mL/min signify possible Chronic Kidney Disease.   . Anion gap 11/07/2016 8  5 - 15 Final  . Prothrombin Time 11/07/2016 12.5  11.4 - 15.2 seconds Final  . INR 11/07/2016 0.95   Final  . ABO/RH(D) 11/07/2016 O POS   Final  . Antibody Screen 11/07/2016 NEG   Final  . Sample Expiration 11/07/2016 11/19/2016   Final  . Extend sample reason 11/07/2016 NO TRANSFUSIONS OR PREGNANCY IN THE PAST 3 MONTHS   Final  . MRSA, PCR 11/07/2016 NEGATIVE  NEGATIVE Final  . Staphylococcus aureus 11/07/2016 NEGATIVE  NEGATIVE Final   Comment: (NOTE) The Xpert SA Assay (FDA approved for NASAL specimens in patients 4 years of age and older), is one component of a comprehensive surveillance program. It is not intended to diagnose infection nor to guide or monitor treatment.   . ABO/RH(D) 11/07/2016 O POS   Final     X-Rays:Dg Pelvis Portable  Result Date: 11/16/2016 CLINICAL DATA:  Left hip arthroplasty EXAM: PORTABLE PELVIS 1-2 VIEWS COMPARISON:  None. FINDINGS: Rotated AP view of the pelvis and left hip shows a located total left hip arthroplasty. No evidence of periprosthetic fracture. Surgical drain in place. Heterotopic ossification seen in the medial left thigh. IMPRESSION: Left total hip arthroplasty without adverse finding. Electronically Signed   By: Monte Fantasia M.D.   On: 11/16/2016 11:51   Dg C-arm 61-120 Min-no Report  Result Date: 11/16/2016 Fluoroscopy was utilized by the requesting physician.  No radiographic interpretation.    EKG: Orders placed or performed in visit on 11/04/16  . EKG 12-Lead     Hospital Course: Patient was admitted to [facility] and taken to the OR and underwent the above state procedure without complications.  Patient tolerated the procedure well and was later transferred to the recovery room and then to the orthopaedic floor for postoperative care.  They were given PO and IV analgesics for pain control following  their surgery.  They were given 24 hours of postoperative antibiotics of  Anti-infectives    Start     Dose/Rate Route Frequency Ordered Stop   11/16/16 1600  ceFAZolin (ANCEF) IVPB 1 g/50 mL premix     1 g 100 mL/hr over 30 Minutes Intravenous Every 6 hours 11/16/16 1334 11/16/16 2152   11/16/16 0734  ceFAZolin (ANCEF) 2-4 GM/100ML-% IVPB  Comments:  Wald[place] Session   : cabinet override      11/16/16 0734 11/16/16 0942   11/16/16 0732  ceFAZolin (ANCEF) IVPB 2g/100 mL premix     2 g 200 mL/hr over 30 Minutes Intravenous On call to O.R. 11/16/16 0732 11/16/16 1012     and started on DVT prophylaxis in the form of Xarelto.   PT and OT were ordered for total hip protocol.  The patient was allowed to be WBAT with therapy. Discharge planning was consulted to help with postop disposition and equipment needs.  Patient had a good night on the evening of surgery.  They started to get up OOB with therapy on day one.  Hemovac drain was pulled without difficulty.  Dressing was checked and was clean and dry.  Patient was seen in rounds on day one by Dr. Wynelle Link and it was felt that as long as they met therapy goals, they would be ready to go home.  Diet - Cardiac diet Follow up - in 2 weeks Activity - WBAT Disposition - Home Condition Upon Discharge - stable D/C Meds - See DC Summary DVT Prophylaxis - Xarelto  Discharge Instructions    Call MD / Call 911    Complete by:  As directed    If you experience chest pain or shortness of breath, CALL 911 and be transported to the hospital emergency room.  If you develope a fever above 101 F, pus (white drainage) or increased drainage or redness at the wound, or calf pain, call your surgeon's office.   Change dressing    Complete by:  As directed    You may change your dressing dressing daily with sterile 4 x 4 inch gauze dressing and paper tape.  Do not submerge the incision under water.   Constipation Prevention    Complete by:  As directed    Drink  plenty of fluids.  Prune juice may be helpful.  You may use a stool softener, such as Colace (over the counter) 100 mg twice a day.  Use MiraLax (over the counter) for constipation as needed.   Diet - low sodium heart healthy    Complete by:  As directed    Discharge instructions    Complete by:  As directed    Take Xarelto for two and a half more weeks, then discontinue Xarelto. Once the patient has completed the blood thinner regimen, then take a Baby 81 mg Aspirin daily for three more weeks.   Pick up stool softner and laxative for home use following surgery while on pain medications. Do not submerge incision under water. Please use good hand washing techniques while changing dressing each day. May shower starting three days after surgery. Please use a clean towel to pat the incision dry following showers. Continue to use ice for pain and swelling after surgery. Do not use any lotions or creams on the incision until instructed by your surgeon.  Wear both TED hose on both legs during the day every day for three weeks, but may remove the TED hose at night at home.  Postoperative Constipation Protocol  Constipation - defined medically as fewer than three stools per week and severe constipation as less than one stool per week.  One of the most common issues patients have following surgery is constipation.  Even if you have a regular bowel pattern at home, your normal regimen is likely to be disrupted due to multiple reasons following surgery.  Combination of anesthesia, postoperative narcotics,  change in appetite and fluid intake all can affect your bowels.  In order to avoid complications following surgery, here are some recommendations in order to help you during your recovery period.  Colace (docusate) - Pick up an over-the-counter form of Colace or another stool softener and take twice a day as long as you are requiring postoperative pain medications.  Take with a full glass of water  daily.  If you experience loose stools or diarrhea, hold the colace until you stool forms back up.  If your symptoms do not get better within 1 week or if they get worse, check with your doctor.  Dulcolax (bisacodyl) - Pick up over-the-counter and take as directed by the product packaging as needed to assist with the movement of your bowels.  Take with a full glass of water.  Use this product as needed if not relieved by Colace only.   MiraLax (polyethylene glycol) - Pick up over-the-counter to have on hand.  MiraLax is a solution that will increase the amount of water in your bowels to assist with bowel movements.  Take as directed and can mix with a glass of water, juice, soda, coffee, or tea.  Take if you go more than two days without a movement. Do not use MiraLax more than once per day. Call your doctor if you are still constipated or irregular after using this medication for 7 days in a row.  If you continue to have problems with postoperative constipation, please contact the office for further assistance and recommendations.  If you experience "the worst abdominal pain ever" or develop nausea or vomiting, please contact the office immediatly for further recommendations for treatment.   Do not sit on low chairs, stoools or toilet seats, as it may be difficult to get up from low surfaces    Complete by:  As directed    Driving restrictions    Complete by:  As directed    No driving until released by the physician.   Increase activity slowly as tolerated    Complete by:  As directed    Lifting restrictions    Complete by:  As directed    No lifting until released by the physician.   Patient may shower    Complete by:  As directed    You may shower without a dressing once there is no drainage.  Do not wash over the wound.  If drainage remains, do not shower until drainage stops.   TED hose    Complete by:  As directed    Use stockings (TED hose) for 3 weeks on both leg(s).  You may remove  them at night for sleeping.   Weight bearing as tolerated    Complete by:  As directed    Laterality:  left   Extremity:  Lower     Allergies as of 11/17/2016      Reactions   Voltaren [diclofenac Sodium] Anaphylaxis   Aricept [donepezil Hcl] Itching   Itchy, jittery (no hives)   Aspirin Other (See Comments)   REACTION: GI upset   Atorvastatin    REACTION: pain   Simvastatin    REACTION: muscle pain      Medication List    TAKE these medications   [facility] 8 HOUR PAIN RELIEF 650 MG CR tablet Generic drug:  acetaminophen Take 650 mg by mouth 2 (two) times daily as needed for pain.   methocarbamol 500 MG tablet Commonly known as:  ROBAXIN Take 1 tablet (500 mg  total) by mouth every 6 (six) hours as needed for muscle spasms.   oxyCODONE 5 MG immediate release tablet Commonly known as:  Oxy IR/ROXICODONE Take 1-2 tablets (5-10 mg total) by mouth every 4 (four) hours as needed for moderate pain or severe pain.   rivaroxaban 10 MG Tabs tablet Commonly known as:  XARELTO Take 1 tablet (10 mg total) by mouth daily with breakfast. Take Xarelto for two and a half more weeks following discharge from the hospital, then discontinue Xarelto. Once the patient has completed the blood thinner regimen, then take a Baby 81 mg Aspirin daily for three more weeks.   traMADol 50 MG tablet Commonly known as:  ULTRAM Take 1-2 tablets (50-100 mg total) by mouth every 6 (six) hours as needed for moderate pain.            Discharge Care Instructions        Start     Ordered   11/17/16 0000  methocarbamol (ROBAXIN) 500 MG tablet  Every 6 hours PRN    Question:  Supervising Provider  Answer:  Gaynelle Arabian   11/17/16 0808   11/17/16 0000  oxyCODONE (OXY IR/ROXICODONE) 5 MG immediate release tablet  Every 4 hours PRN    Question:  Supervising Provider  Answer:  Gaynelle Arabian   11/17/16 0808   11/17/16 0000  rivaroxaban (XARELTO) 10 MG TABS tablet  Daily with breakfast    Question:   Supervising Provider  Answer:  Gaynelle Arabian   11/17/16 0808   11/17/16 0000  traMADol (ULTRAM) 50 MG tablet  Every 6 hours PRN    Question:  Supervising Provider  Answer:  Gaynelle Arabian   11/17/16 0808   11/17/16 0000  Call MD / Call 911    Comments:  If you experience chest pain or shortness of breath, CALL 911 and be transported to the hospital emergency room.  If you develope a fever above 101 F, pus (white drainage) or increased drainage or redness at the wound, or calf pain, call your surgeon's office.   11/17/16 0808   11/17/16 0000  Discharge instructions    Comments:  Take Xarelto for two and a half more weeks, then discontinue Xarelto. Once the patient has completed the blood thinner regimen, then take a Baby 81 mg Aspirin daily for three more weeks.   Pick up stool softner and laxative for home use following surgery while on pain medications. Do not submerge incision under water. Please use good hand washing techniques while changing dressing each day. May shower starting three days after surgery. Please use a clean towel to pat the incision dry following showers. Continue to use ice for pain and swelling after surgery. Do not use any lotions or creams on the incision until instructed by your surgeon.  Wear both TED hose on both legs during the day every day for three weeks, but may remove the TED hose at night at home.  Postoperative Constipation Protocol  Constipation - defined medically as fewer than three stools per week and severe constipation as less than one stool per week.  One of the most common issues patients have following surgery is constipation.  Even if you have a regular bowel pattern at home, your normal regimen is likely to be disrupted due to multiple reasons following surgery.  Combination of anesthesia, postoperative narcotics, change in appetite and fluid intake all can affect your bowels.  In order to avoid complications following surgery, here are some  recommendations in order  to help you during your recovery period.  Colace (docusate) - Pick up an over-the-counter form of Colace or another stool softener and take twice a day as long as you are requiring postoperative pain medications.  Take with a full glass of water daily.  If you experience loose stools or diarrhea, hold the colace until you stool forms back up.  If your symptoms do not get better within 1 week or if they get worse, check with your doctor.  Dulcolax (bisacodyl) - Pick up over-the-counter and take as directed by the product packaging as needed to assist with the movement of your bowels.  Take with a full glass of water.  Use this product as needed if not relieved by Colace only.   MiraLax (polyethylene glycol) - Pick up over-the-counter to have on hand.  MiraLax is a solution that will increase the amount of water in your bowels to assist with bowel movements.  Take as directed and can mix with a glass of water, juice, soda, coffee, or tea.  Take if you go more than two days without a movement. Do not use MiraLax more than once per day. Call your doctor if you are still constipated or irregular after using this medication for 7 days in a row.  If you continue to have problems with postoperative constipation, please contact the office for further assistance and recommendations.  If you experience "the worst abdominal pain ever" or develop nausea or vomiting, please contact the office immediatly for further recommendations for treatment.   11/17/16 0808   11/17/16 0000  Diet - low sodium heart healthy     11/17/16 0808   11/17/16 0000  Constipation Prevention    Comments:  Drink plenty of fluids.  Prune juice may be helpful.  You may use a stool softener, such as Colace (over the counter) 100 mg twice a day.  Use MiraLax (over the counter) for constipation as needed.   11/17/16 0808   11/17/16 0000  Increase activity slowly as tolerated     11/17/16 0808   11/17/16 0000  Patient  may shower    Comments:  You may shower without a dressing once there is no drainage.  Do not wash over the wound.  If drainage remains, do not shower until drainage stops.   11/17/16 0808   11/17/16 0000  Weight bearing as tolerated    Question Answer Comment  Laterality left   Extremity Lower      11/17/16 0808   11/17/16 0000  Driving restrictions    Comments:  No driving until released by the physician.   11/17/16 0808   11/17/16 0000  Lifting restrictions    Comments:  No lifting until released by the physician.   11/17/16 0808   11/17/16 0000  Change dressing    Comments:  You may change your dressing dressing daily with sterile 4 x 4 inch gauze dressing and paper tape.  Do not submerge the incision under water.   11/17/16 0808   11/17/16 0000  TED hose    Comments:  Use stockings (TED hose) for 3 weeks on both leg(s).  You may remove them at night for sleeping.   11/17/16 0808   11/17/16 0000  Do not sit on low chairs, stoools or toilet seats, as it may be difficult to get up from low surfaces     11/17/16 0808     Follow-up Information    Gaynelle Arabian, MD. Schedule an appointment as soon as  possible for a visit on 11/29/2016.   Specialty:  Orthopedic Surgery Contact information: [address] [place] [address] [phone]           Signed: Arlee Muslim, PA-C Orthopaedic Surgery 11/17/2016, 8:09 AM

## 2016-11-17 NOTE — Progress Notes (Signed)
Patient discharged to home w/ family. Given all belongings, instructions, prescriptions. Equipment at home. Family present during education. Verbalized understanding of all instructions. Escorted to POV via w/c.

## 2016-11-18 DIAGNOSIS — I700 Atherosclerosis of aorta: Secondary | ICD-10-CM | POA: Diagnosis not present

## 2016-11-18 DIAGNOSIS — M810 Age-related osteoporosis without current pathological fracture: Secondary | ICD-10-CM | POA: Diagnosis not present

## 2016-11-18 DIAGNOSIS — Z471 Aftercare following joint replacement surgery: Secondary | ICD-10-CM | POA: Diagnosis not present

## 2016-11-18 DIAGNOSIS — J449 Chronic obstructive pulmonary disease, unspecified: Secondary | ICD-10-CM | POA: Diagnosis not present

## 2016-11-18 DIAGNOSIS — G3184 Mild cognitive impairment, so stated: Secondary | ICD-10-CM | POA: Diagnosis not present

## 2016-11-18 DIAGNOSIS — M069 Rheumatoid arthritis, unspecified: Secondary | ICD-10-CM | POA: Diagnosis not present

## 2016-12-27 DIAGNOSIS — Z96642 Presence of left artificial hip joint: Secondary | ICD-10-CM | POA: Diagnosis not present

## 2016-12-27 DIAGNOSIS — Z471 Aftercare following joint replacement surgery: Secondary | ICD-10-CM | POA: Diagnosis not present

## 2017-01-27 DIAGNOSIS — M1612 Unilateral primary osteoarthritis, left hip: Secondary | ICD-10-CM | POA: Diagnosis not present

## 2017-03-07 ENCOUNTER — Other Ambulatory Visit: Payer: Self-pay | Admitting: Family Medicine

## 2017-03-07 DIAGNOSIS — Z1231 Encounter for screening mammogram for malignant neoplasm of breast: Secondary | ICD-10-CM

## 2017-04-10 ENCOUNTER — Ambulatory Visit
Admission: RE | Admit: 2017-04-10 | Discharge: 2017-04-10 | Disposition: A | Discharge status: Home / Self Care | Payer: Medicare Other | Source: Ambulatory Visit | Attending: Family Medicine | Admitting: Family Medicine

## 2017-04-10 DIAGNOSIS — Z1231 Encounter for screening mammogram for malignant neoplasm of breast: Secondary | ICD-10-CM

## 2018-01-04 ENCOUNTER — Ambulatory Visit (INDEPENDENT_AMBULATORY_CARE_PROVIDER_SITE_OTHER): Payer: Medicare Other

## 2018-01-04 DIAGNOSIS — Z23 Encounter for immunization: Secondary | ICD-10-CM | POA: Diagnosis not present

## 2018-03-20 ENCOUNTER — Ambulatory Visit (INDEPENDENT_AMBULATORY_CARE_PROVIDER_SITE_OTHER): Payer: Medicare Other | Admitting: Family Medicine

## 2018-03-20 ENCOUNTER — Encounter: Payer: Self-pay | Admitting: Family Medicine

## 2018-03-20 VITALS — BP 135/70 | HR 56 | Temp 98.5°F | Ht 59.0 in

## 2018-03-20 DIAGNOSIS — I700 Atherosclerosis of aorta: Secondary | ICD-10-CM | POA: Diagnosis not present

## 2018-03-20 DIAGNOSIS — E538 Deficiency of other specified B group vitamins: Secondary | ICD-10-CM | POA: Diagnosis not present

## 2018-03-20 DIAGNOSIS — H938X2 Other specified disorders of left ear: Secondary | ICD-10-CM

## 2018-03-20 DIAGNOSIS — F17200 Nicotine dependence, unspecified, uncomplicated: Secondary | ICD-10-CM

## 2018-03-20 DIAGNOSIS — F411 Generalized anxiety disorder: Secondary | ICD-10-CM

## 2018-03-20 DIAGNOSIS — F419 Anxiety disorder, unspecified: Secondary | ICD-10-CM | POA: Insufficient documentation

## 2018-03-20 DIAGNOSIS — G3184 Mild cognitive impairment, so stated: Secondary | ICD-10-CM

## 2018-03-20 MED ORDER — BUSPIRONE HCL 15 MG PO TABS: 7.5000 mg | ORAL_TABLET | Freq: Two times a day (BID) | ORAL | 11 refills | Status: DC

## 2018-03-20 MED ORDER — MEMANTINE HCL 5 MG PO TABS: ORAL_TABLET | ORAL | 11 refills | Status: DC

## 2018-03-20 MED ORDER — CYANOCOBALAMIN 1000 MCG/ML IJ SOLN
1000.0000 ug | Freq: Once | INTRAMUSCULAR | Status: AC
  Administered 2018-03-20: 1000 ug via INTRAMUSCULAR

## 2018-03-20 NOTE — Progress Notes (Signed)
Subjective:    Patient ID: Tammy Neal, female    DOB: 1950-12-15, 68 y.o.   MRN: [id number]  HPI  Here for ear problems  R ear bothers her  Feels like a drum/echos when she talks (worse to bite down) Sounds like wind blowing  3 weeks now  No pain  No drainage   No uri symptoms  No runny or stuffy nose  No pain in face  Other ear is fine   No otc or px medicines   Also to discuss dementia   Had a hip replacement in 2018 That went well  Still a twinge here and there   Smoking status - 1/2 ppd  Does not want to quit  No cough or sob (family says she has a smoker's cough)   Wt Readings from Last 3 Encounters:  11/16/16 121 lb (54.9 kg)  11/07/16 121 lb 8 oz (55.1 kg)  11/04/16 122 lb (55.3 kg)  stable  24.44 kg/m   BP Readings from Last 3 Encounters:  03/20/18 (!) 142/78  11/17/16 137/76  11/07/16 (!) 161/79    H/o MCI Intolerant of aricept even low dose Not on any medication now for this   Pt is not aware of a lot of change-especially when she is alone  Family notes a little worse  Misplaces things   Does take care of great grandchild occasionally  Accidentally went to the bus stop on Saturday Does best with a regular routine   Still drives (family obs her driving closely and she does well) Housework/cooks  Lives alone  The only thing daughter helps with is checking bills/finances  Gets frustrated when she gets confused  Then feels panicky (easily)   Stays very busy  Puzzles and games    H/o low B12  Lab Results  Component Value Date   [id number] 767 12/22/2015  never treated this  Aware it can affect memory   Pt declines preventative care or health mt   Patient Active Problem List   Diagnosis Date Noted  . Low vitamin B12 level 03/20/2018  . Ear pressure, left 03/20/2018  . Anxiety disorder 03/20/2018  . OA (osteoarthritis) of hip 11/16/2016  . Aortic atherosclerosis ([place]) 11/06/2016  . Pre-operative exam 11/04/2016  . Arthritis  of left hip 07/13/2016  . Vitamin B12 deficiency 12/29/2015  . Estrogen deficiency 12/29/2015  . Cough 04/30/2015  . Mild cognitive impairment 01/02/2015  . Routine general medical examination at a health care facility 10/26/2011  . Gynecological examination 07/05/2010  . DEPRESSION 05/03/2010  . Hyperlipidemia 02/26/2007  . TOBACCO ABUSE 02/26/2007  . PSORIASIS 02/26/2007   Past Medical History:  Diagnosis Date  . Allergy    allergic rhinitis  . Arthritis   . COPD (chronic obstructive pulmonary disease) ([place])   . Hyperlipidemia    no meds  . Neck pain, chronic    with fusion  . Osteoporosis   . Psoriasis    Past Surgical History:  Procedure Laterality Date  . ABDOMINAL HYSTERECTOMY     endometriosis 1 ovary left  . APPENDECTOMY    . NECK SURGERY    . SPINE SURGERY  1977   cervical fusion Dr Carloyn Manner x2  . 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   Procedure: LEFT TOTAL HIP ARTHROPLASTY ANTERIOR APPROACH;  Surgeon: Gaynelle Arabian, MD;  Location: [facility];  Service: Orthopedics;  Laterality: Left;   Social History   Tobacco Use  . Smoking status: Current Every Day Smoker  Packs/day: 0.50  . Smokeless tobacco: Never Used  Substance Use Topics  . Alcohol use: No    Alcohol/week: 0.0 standard drinks  . Drug use: No   Family History  Problem Relation Age of Onset  . Hypertension Mother   . Diabetes Mother   . Stroke Mother 51  . Asthma Father   . COPD Father   . Colon cancer Neg Hx   . Esophageal cancer Neg Hx   . Ulcerative colitis Neg Hx   . Stomach cancer Neg Hx    Allergies  Allergen Reactions  . Voltaren [Diclofenac Sodium] Anaphylaxis  . Aricept [Donepezil Hcl] Itching    Itchy, jittery (no hives)  . Aspirin Other (See Comments)    REACTION: GI upset  . Atorvastatin     REACTION: pain  . Simvastatin     REACTION: muscle pain   Current Outpatient Medications on File Prior to Visit  Medication Sig Dispense Refill  . acetaminophen ([facility] 8 HOUR PAIN  RELIEF) 650 MG CR tablet Take 650 mg by mouth 2 (two) times daily as needed for pain.      No current facility-administered medications on file prior to visit.     Review of Systems  Constitutional: Negative for activity change, appetite change, fatigue, fever and unexpected weight change.  HENT: Positive for hearing loss and tinnitus. Negative for congestion, ear discharge, ear pain, postnasal drip, rhinorrhea, sinus pressure, sinus pain and sore throat.        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  Respiratory: Negative for cough, shortness of breath and wheezing.        Per family- smoker's cough  Cardiovascular: Negative for chest pain and palpitations.  Gastrointestinal: Negative for abdominal pain, blood in stool, constipation and diarrhea.  Endocrine: Negative for polydipsia and polyuria.  Genitourinary: Negative for dysuria, frequency and urgency.  Musculoskeletal: Negative for arthralgias, back pain and myalgias.  Skin: Negative for pallor and rash.  Allergic/Immunologic: Negative for environmental allergies.  Neurological: Negative for dizziness, syncope and headaches.  Hematological: Negative for adenopathy. Does not bruise/bleed easily.  Psychiatric/Behavioral: Positive for confusion. Negative for decreased concentration and dysphoric mood. The patient is nervous/anxious.        Poor short term memory  occ confusion with complex tasks        Objective:   Physical Exam Constitutional:      General: She is not in acute distress.    Appearance: Normal appearance. She is well-developed. She is not ill-appearing.  HENT:     Head: Normocephalic and atraumatic.     Comments: No sinus tenderness     Right Ear: Tympanic membrane and external ear normal.     Left Ear: Tympanic membrane and external ear normal.     Ears:     Comments: Partial cerumen obst in both ears  TMS grossly normal       Nose: Nose normal. No congestion or rhinorrhea.     Mouth/Throat:     Mouth: Mucous membranes are moist.     Pharynx: Oropharynx is clear. No posterior oropharyngeal erythema.  Eyes:     General: No scleral icterus.    Conjunctiva/sclera: Conjunctivae normal.     Pupils: Pupils are equal, round, and reactive to light.  Neck:     Musculoskeletal: Normal range of motion and neck supple.     Thyroid: No thyromegaly.     Vascular:  No carotid bruit or JVD.  Cardiovascular:     Rate and Rhythm: Normal rate and regular rhythm.     Heart sounds: Normal heart sounds. No gallop.   Pulmonary:     Effort: Pulmonary effort is normal. No respiratory distress.     Breath sounds: Normal breath sounds. No wheezing or rales.     Comments: Diffusely distant bs No wheezing Abdominal:     General: Bowel sounds are normal. There is no distension or abdominal bruit.     Palpations: Abdomen is soft. There is no mass.     Tenderness: There is no abdominal tenderness.  Musculoskeletal:     Right lower leg: No edema.     Left lower leg: No edema.  Lymphadenopathy:     Cervical: No cervical adenopathy.  Skin:    General: Skin is warm and dry.     Capillary Refill: Capillary refill takes less than 2 seconds.     Coloration: Skin is not pale.     Findings: No rash.  Neurological:     Mental Status: She is alert. Mental status is at baseline.     Cranial Nerves: No cranial nerve deficit.     Motor: No weakness.     Deep Tendon Reflexes: Reflexes are normal and symmetric. Reflexes normal.  Psychiatric:        Attention and Perception: Attention normal.        Mood and Affect: Mood is anxious.        Cognition and Memory: Cognition is impaired. She exhibits impaired recent memory.     Comments: Mildly anxious  Pleasant  Quiet but answers questions appropriately           Assessment & Plan:   Problem List Items Addressed This Visit      Cardiovascular and Mediastinum   Aortic atherosclerosis  ([place])    No clinical changes Pt declines health mt or cholesterol check at this time         Other   TOBACCO ABUSE    Disc in detail risks of smoking and possible outcomes including copd, vascular/ heart disease, cancer , respiratory and sinus infections  Pt voices understanding Pt is not interested in quitting       Mild cognitive impairment - Primary    Mildly progressed since last visit Good care from family  Allergic to aricept She is open to Namenda-will titrate up to 5 mg bid and see how it goes  Also tx anxiety with buspar 7.5 bid (frustration from memory has caused anx attacks)      Low vitamin B12 level    Pt never treated this  Lab Results  Component Value Date   VITAMINB12 223 12/22/2015   Enc her to re consider because B12 def can affect memory  B12 shot today inst to take 1000 mcg B12 otc oral daily  Re check in 2 mo       Relevant Medications   cyanocobalamin ((VITAMIN B-12)) injection 1,000 mcg (Completed)   Ear pressure, left    Some cerumen -recommend debrox or other similar brand of solution for both ears  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  Enc her to check in with dentist about getting a nite guard  inst to update [place] if neither of these things help      Anxiety disorder    Worse with the frustration of memory loss (MCI) Disc opt for medication Will try buspar 7.5 mg bid -and can  titrate if needed  Discussed expectations of this medication including time to effectiveness and mechanism of action, also poss of side effects (early and late)- including mental fuzziness, weight or appetite change, nausea and poss of worse dep or anxiety (even suicidal thoughts)  Pt voiced understanding and will stop med and update if this occurs   Reviewed stressors/ coping techniques/symptoms/ support sources/ tx options and side effects in detail today Family will keep [place] updated      Relevant Medications   busPIRone (BUSPAR) 15 MG tablet

## 2018-03-20 NOTE — Assessment & Plan Note (Signed)
Mildly progressed since last visit Good care from family  Allergic to aricept She is open to Namenda-will titrate up to 5 mg bid and see how it goes  Also tx anxiety with buspar 7.5 bid (frustration from memory has caused anx attacks)

## 2018-03-20 NOTE — Assessment & Plan Note (Signed)
Disc in detail risks of smoking and possible outcomes including copd, vascular/ heart disease, cancer , respiratory and sinus infections  Pt voices understanding Pt is not interested in quitting  

## 2018-03-20 NOTE — Assessment & Plan Note (Signed)
No clinical changes Pt declines health mt or cholesterol check at this time

## 2018-03-20 NOTE — Assessment & Plan Note (Signed)
Worse with the frustration of memory loss (MCI) Disc opt for medication Will try buspar 7.5 mg bid -and can titrate if needed  Discussed expectations of this medication including time to effectiveness and mechanism of action, also poss of side effects (early and late)- including mental fuzziness, weight or appetite change, nausea and poss of worse dep or anxiety (even suicidal thoughts)  Pt voiced understanding and will stop med and update if this occurs   Reviewed stressors/ coping techniques/symptoms/ support sources/ tx options and side effects in detail today Family will keep [place] updated

## 2018-03-20 NOTE — Assessment & Plan Note (Signed)
Pt never treated this  Lab Results  Component Value Date   [id number] 329 12/22/2015   Enc her to re consider because B12 def can affect memory  B12 shot today inst to take 1000 mcg B12 otc oral daily  Re check in 2 mo

## 2018-03-20 NOTE — Patient Instructions (Addendum)
Try debrox over the counter for ear wax in both ears as directed   Also check in with dentist regarding TMJ - ? A nite guard may help   Here is a px for  Namenda (start with 1 pill daily then advance to twice daily after a week)  This is to slow down memory loss   Also here is a px for buspar  This is a mild anxiety medicine  Take 1/2 pill twice daily  If side effects let me know   You can decide what you want to start first   Lets do a B12 shot today  Then get vitamin B12 1000 mcg and take it once daily  B12 is important for memory   Think about quitting smoking   Call to schedule labs for a B12 level in 2 months

## 2018-03-20 NOTE — Assessment & Plan Note (Signed)
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

## 2018-09-24 ENCOUNTER — Telehealth: Payer: Self-pay

## 2018-09-24 ENCOUNTER — Encounter: Payer: Self-pay | Admitting: Internal Medicine

## 2018-09-24 MED ORDER — NYSTATIN 100000 UNIT/GM EX CREA: 1.0000 "application " | TOPICAL_CREAM | Freq: Two times a day (BID) | CUTANEOUS | 0 refills | Status: DC

## 2018-09-24 NOTE — Telephone Encounter (Signed)
Spoke with Ronny Bacon and she thinks it's a yeast rash too. She said with's pt's memory issues that anytime anything itches on her skin she automatically assumes it's psoriasis, but Ronny Bacon said it looks like a yeast rash  (pt's had one in the past). Ronny Bacon has been using OTC [place] powder BID and also making sure that the are is clean and dry multiple times a day. Ronny Bacon said that the OTC med isn't helping and since pt is with her in [place] due to Covid she is requesting an Rx strength med/cream/powder she can use to help pt with the rash and itching.   [facility] in [place], [place] (chart's updated with new pharmacy)

## 2018-09-24 NOTE — Telephone Encounter (Signed)
Natasha notified of Dr. Marliss Coots comments and verbalized understanding

## 2018-09-24 NOTE — Telephone Encounter (Signed)
Ronny Bacon (DPR signed left v/m that pt is staying with her in [place] at this time due to covid . Under both breast pt has chafed or chapped areas. Not sure if could be psoriasis. The psoriasis on pts buttock is bad and request med for that psoriasis or should pt be seen in [place] or can Dr Glori Bickers do virtual visit with pt. Natasha request cb when reviewed by Dr Glori Bickers.pt last seen 03/20/18 for discussion of dimentia and ear problem.

## 2018-09-24 NOTE — Telephone Encounter (Signed)
The rash under breasts if probably yeast (this is a common area for that) - keep clean and dry and they can try an anti fungal cream or powder otc   Psoriasis is usually treated by a dermatologist and not curable but treatable.   Does she have one?  What are they using on it now and what does it look like?  Thanks

## 2018-09-24 NOTE — Telephone Encounter (Signed)
I sent nystatin cream  Let me know if it does not help

## 2018-10-17 ENCOUNTER — Telehealth: Payer: Self-pay

## 2018-10-17 NOTE — Telephone Encounter (Signed)
Tammy Neal notified of Dr. Marliss Coots comments. She will upload a pic on mychart

## 2018-10-17 NOTE — Telephone Encounter (Signed)
Glad it is improving (rash)  Can they take a picture of it with good light and send it via mychart?

## 2018-10-17 NOTE — Telephone Encounter (Signed)
Ronny Bacon (DPR signed)said pts mom "felt funny this morning" pt was sweating and sleepy; since the initial call from [facility] she realized pt took Buspar 15 mg one tab instead of 1/2 tab this morning;  pt is not sweating now and feels better. natasha said that the rash under breast looks better but not gone. What they have been doing is using the medicated powder in the morning to help pt stay dry and using the nystatin cream at night. Ronny Bacon said if could advise over phone that is fine or if pt needs to be seen in office can bring pt to office this week. Natasha request cb. Pt is back home this week.Please advise.

## 2019-03-21 ENCOUNTER — Telehealth: Payer: Self-pay | Admitting: Family Medicine

## 2019-03-21 ENCOUNTER — Encounter: Payer: Self-pay | Admitting: Family Medicine

## 2019-03-21 NOTE — Telephone Encounter (Signed)
I spoke to both she and her pt encouraging the vaccine and discussing safety

## 2019-03-21 NOTE — Telephone Encounter (Signed)
Spoke with patient daughter Toma Copier, on [place]) - requesting a call back from Dr Glori Bickers to discuss concerns patient has regarding the covid vaccine. The patient is very concerned with her high intolerance to most medications and how she reacts to most that she will have a reaction to the Covid vaccine. Pt has never had any known reactions to any other vaccines in the fast (flu, pna, tetanus, etc).   Ronny Bacon states that she tried to explain to the patient that she has never reacted to any other vaccine and her intolerance is only to oral medications but the patient was very adamant that she wanted to hear this from Dr Glori Bickers.   Please advise, thanks.

## 2019-04-09 ENCOUNTER — Other Ambulatory Visit: Payer: Self-pay

## 2019-04-09 MED ORDER — MEMANTINE HCL 5 MG PO TABS: ORAL_TABLET | ORAL | 0 refills | Status: DC

## 2019-04-09 NOTE — Telephone Encounter (Signed)
Please schedule a f/u in the spring and refill until then

## 2019-04-09 NOTE — Telephone Encounter (Signed)
appt scheduled for next week due to family being off, med refilled once

## 2019-04-09 NOTE — Telephone Encounter (Signed)
Ronny Bacon pts daughter left v/m requesting status of refill request from pharmacy for [place]. Pt has been out of med for 2 days. Natasha request cb.

## 2019-04-09 NOTE — Telephone Encounter (Signed)
I have not received a refill request but pt hasn't been seen in over a year and no future appts, please advise

## 2019-04-16 ENCOUNTER — Encounter: Payer: Self-pay | Admitting: Family Medicine

## 2019-04-16 ENCOUNTER — Other Ambulatory Visit: Payer: Self-pay

## 2019-04-16 ENCOUNTER — Ambulatory Visit: Payer: Medicare Other | Admitting: Family Medicine

## 2019-04-16 VITALS — BP 150/70 | HR 70 | Temp 97.4°F | Ht 59.0 in | Wt 123.4 lb

## 2019-04-16 DIAGNOSIS — E538 Deficiency of other specified B group vitamins: Secondary | ICD-10-CM

## 2019-04-16 DIAGNOSIS — F411 Generalized anxiety disorder: Secondary | ICD-10-CM | POA: Diagnosis not present

## 2019-04-16 DIAGNOSIS — I700 Atherosclerosis of aorta: Secondary | ICD-10-CM | POA: Diagnosis not present

## 2019-04-16 DIAGNOSIS — E7800 Pure hypercholesterolemia, unspecified: Secondary | ICD-10-CM

## 2019-04-16 DIAGNOSIS — R030 Elevated blood-pressure reading, without diagnosis of hypertension: Secondary | ICD-10-CM | POA: Diagnosis not present

## 2019-04-16 DIAGNOSIS — R7989 Other specified abnormal findings of blood chemistry: Secondary | ICD-10-CM

## 2019-04-16 DIAGNOSIS — F17200 Nicotine dependence, unspecified, uncomplicated: Secondary | ICD-10-CM

## 2019-04-16 DIAGNOSIS — L304 Erythema intertrigo: Secondary | ICD-10-CM

## 2019-04-16 DIAGNOSIS — G3184 Mild cognitive impairment, so stated: Secondary | ICD-10-CM

## 2019-04-16 LAB — COMPREHENSIVE METABOLIC PANEL
ALT: 9 U/L (ref 0–35)
AST: 14 U/L (ref 0–37)
Albumin: 3.9 g/dL (ref 3.5–5.2)
Alkaline Phosphatase: 76 U/L (ref 39–117)
BUN: 11 mg/dL (ref 6–23)
CO2: 33 mEq/L — ABNORMAL HIGH (ref 19–32)
Calcium: 9.1 mg/dL (ref 8.4–10.5)
Chloride: 105 mEq/L (ref 96–112)
Creatinine, Ser: 0.86 mg/dL (ref 0.40–1.20)
GFR: 65.28 mL/min (ref >60.00)
Glucose, Bld: 72 mg/dL (ref 70–99)
Potassium: 4.1 mEq/L (ref 3.5–5.1)
Sodium: 142 mEq/L (ref 135–145)
Total Bilirubin: 0.5 mg/dL (ref 0.2–1.2)
Total Protein: 6.5 g/dL (ref 6.0–8.3)

## 2019-04-16 LAB — CBC WITH DIFFERENTIAL/PLATELET
Basophils Absolute: 0 10*3/uL (ref 0.0–0.1)
Basophils Relative: 0.7 % (ref 0.0–3.0)
Eosinophils Absolute: 0.1 10*3/uL (ref 0.0–0.7)
Eosinophils Relative: 1.2 % (ref 0.0–5.0)
HCT: 45.1 % (ref 36.0–46.0)
Hemoglobin: 15.1 g/dL — ABNORMAL HIGH (ref 12.0–15.0)
Lymphocytes Relative: 23.2 % (ref 12.0–46.0)
Lymphs Abs: 1.7 10*3/uL (ref 0.7–4.0)
MCHC: 33.3 g/dL (ref 30.0–36.0)
MCV: 94.5 fl (ref 78.0–100.0)
Monocytes Absolute: 0.7 10*3/uL (ref 0.1–1.0)
Monocytes Relative: 10.1 % (ref 3.0–12.0)
Neutro Abs: 4.6 10*3/uL (ref 1.4–7.7)
Neutrophils Relative %: 64.8 % (ref 43.0–77.0)
Platelets: 226 10*3/uL (ref 150.0–400.0)
RBC: 4.78 Mil/uL (ref 3.87–5.11)
RDW: 13.9 % (ref 11.5–15.5)
WBC: 7.1 10*3/uL (ref 4.0–10.5)

## 2019-04-16 LAB — LIPID PANEL
Cholesterol: 207 mg/dL — ABNORMAL HIGH (ref 0–200)
HDL: 37.6 mg/dL — ABNORMAL LOW (ref >39.00)
NonHDL: 169.72
Total CHOL/HDL Ratio: 6
Triglycerides: 210 mg/dL — ABNORMAL HIGH (ref 0.0–149.0)
VLDL: 42 mg/dL — ABNORMAL HIGH (ref 0.0–40.0)

## 2019-04-16 LAB — TSH: TSH: 2.86 u[IU]/mL (ref 0.35–4.50)

## 2019-04-16 LAB — VITAMIN B12: Vitamin B-12: 200 pg/mL — ABNORMAL LOW (ref 211–911)

## 2019-04-16 LAB — LDL CHOLESTEROL, DIRECT: Direct LDL: 121 mg/dL

## 2019-04-16 MED ORDER — MEMANTINE HCL 10 MG PO TABS: 10.0000 mg | ORAL_TABLET | Freq: Two times a day (BID) | ORAL | 3 refills | Status: DC

## 2019-04-16 MED ORDER — BUSPIRONE HCL 15 MG PO TABS: ORAL_TABLET | ORAL | 3 refills | Status: DC

## 2019-04-16 MED ORDER — CYANOCOBALAMIN 1000 MCG/ML IJ SOLN
1000.0000 ug | Freq: Once | INTRAMUSCULAR | Status: AC
  Administered 2019-04-16: 1000 ug via INTRAMUSCULAR

## 2019-04-16 NOTE — Assessment & Plan Note (Signed)
Disc in detail risks of smoking and possible outcomes including copd, vascular/ heart disease, cancer , respiratory and sinus infections  Pt voices understanding Some smokers cough Not interested in quitting Given info on the lung cancer screening program through [facility] - will leet [place] know if interested

## 2019-04-16 NOTE — Patient Instructions (Addendum)
Increase namenda to 5 mg in am and 10 mg at night for 1-2 weeks -then increase to 10 mg twice daily   Get 1000 mcg B12 supplement and take it daily  We will check level and do B12 shot today   Labs today   Blood pressure is up a bit  Please check some random blood pressure readings at home when relaxed for 2 weeks and send me a report   You are due for a pneumonia vaccine - call us to get this at least a month after covid vaccines   Let us know if you are interested in the lung screening program

## 2019-04-16 NOTE — Progress Notes (Signed)
Subjective:    Patient ID: Tammy Neal, female    DOB: 12-08-1950, 69 y.o.   MRN: [id number]  This visit occurred during the SARS-CoV-2 public health emergency.  Safety protocols were in place, including screening questions prior to the visit, additional usage of staff PPE, and extensive cleaning of exam room while observing appropriate contact time as indicated for disinfecting solutions.    HPI Pt presents for f/u of chronic medical problems   Has a rash under breasts that comes and goes -with nystatin cream  Has a psoriasis spot on buttocks   Family had covid but she never got it  Is on a waiting list for vaccine  Has not had flu vaccine yet   Wt Readings from Last 3 Encounters:  04/16/19 123 lb 6 oz (56 kg)  11/16/16 121 lb (54.9 kg)  11/07/16 121 lb 8 oz (55.1 kg)  stable weight  24.92 kg/m   Feeling good  Nothing new going on  Still has the same aches/pains esp in neck  Takes occ tylenol   Not going out at all - less socialization with the pandemic Is able to call folks and do video visits    She has declined health mt in the past   BP Readings from Last 3 Encounters:  04/16/19 (!) 150/70  03/20/18 135/70  11/17/16 137/76   Pulse Readings from Last 3 Encounters:  04/16/19 70  03/20/18 (!) 56  11/17/16 60   Just ate a ham biscuit -unusual   Generalized anxiety disorder  buspar 3.75 mg bid - works well for her   Mild cognitive impairment Allergic to aricept Taking namenda 5 mg bid (she is tolerated well) and family does not think memory has worsened in the past year  She socializes/video and stays busy at home - reads a lot and loves coloring books    Smoking status - 1/2 ppd - less than she used to  Not interested in quitting  May be interested in lung cancer screening  Has a smoker's cough   H/o low vit B12 Lab Results  Component Value Date   [id number] 12/22/2015  she never did talking  Knows it is important for memory     H/o  aortic atherosclerosis  Also hyperlipidemia Lab Results  Component Value Date   CHOL 223 (H) 12/22/2015   HDL 44.10 12/22/2015   LDLDIRECT 138.0 12/22/2015   TRIG 217.0 (H) 12/22/2015   CHOLHDL 5 12/22/2015   Intolerant of all statins tried Is interested in checking it  ? If would consider rosuvastatin twice weekly  Diet is fair   Lives with daughter who cooks- lots of grilled chicken and vegetables    Patient Active Problem List   Diagnosis Date Noted  . Elevated blood pressure reading without diagnosis of hypertension 04/16/2019  . Low vitamin B12 level 03/20/2018  . Anxiety disorder 03/20/2018  . OA (osteoarthritis) of hip 11/16/2016  . Aortic atherosclerosis ([place]) 11/06/2016  . Pre-operative exam 11/04/2016  . Arthritis of left hip 07/13/2016  . Estrogen deficiency 12/29/2015  . Cough 04/30/2015  . Mild cognitive impairment 01/02/2015  . Intertrigo 11/02/2011  . Routine general medical examination at a health care facility 10/26/2011  . Gynecological examination 07/05/2010  . DEPRESSION 05/03/2010  . Hyperlipidemia 02/26/2007  . TOBACCO ABUSE 02/26/2007  . PSORIASIS 02/26/2007   Past Medical History:  Diagnosis Date  . Allergy    allergic rhinitis  . Arthritis   . COPD (chronic obstructive pulmonary  disease) ([place])   . Hyperlipidemia    no meds  . Neck pain, chronic    with fusion  . Osteoporosis   . Psoriasis    Past Surgical History:  Procedure Laterality Date  . ABDOMINAL HYSTERECTOMY     endometriosis 1 ovary left  . APPENDECTOMY    . NECK SURGERY    . SPINE SURGERY  1977   cervical fusion Dr Carloyn Manner x2  . 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   Procedure: LEFT TOTAL HIP ARTHROPLASTY ANTERIOR APPROACH;  Surgeon: Gaynelle Arabian, MD;  Location: [facility];  Service: Orthopedics;  Laterality: Left;   Social History   Tobacco Use  . Smoking status: Current Every Day Smoker    Packs/day: 0.50  . Smokeless tobacco: Never Used  Substance Use Topics  .  Alcohol use: No    Alcohol/week: 0.0 standard drinks  . Drug use: No   Family History  Problem Relation Age of Onset  . Hypertension Mother   . Diabetes Mother   . Stroke Mother 71  . Asthma Father   . COPD Father   . Colon cancer Neg Hx   . Esophageal cancer Neg Hx   . Ulcerative colitis Neg Hx   . Stomach cancer Neg Hx    Allergies  Allergen Reactions  . Voltaren [Diclofenac Sodium] Anaphylaxis  . Aricept [Donepezil Hcl] Itching    Itchy, jittery (no hives)  . Aspirin Other (See Comments)    REACTION: GI upset  . Atorvastatin     REACTION: pain  . Simvastatin     REACTION: muscle pain   Current Outpatient Medications on File Prior to Visit  Medication Sig Dispense Refill  . acetaminophen ([facility] 8 HOUR PAIN RELIEF) 650 MG CR tablet Take 650 mg by mouth 2 (two) times daily as needed for pain.     Marland Kitchen nystatin cream (MYCOSTATIN) Apply 1 application topically 2 (two) times daily. To affected area 30 g 0   No current facility-administered medications on file prior to visit.     Review of Systems  Constitutional: Positive for fatigue. Negative for activity change, appetite change, fever and unexpected weight change.  HENT: Negative for congestion, ear pain, rhinorrhea, sinus pressure and sore throat.   Eyes: Negative for pain, redness and visual disturbance.  Respiratory: Positive for cough. Negative for shortness of breath and wheezing.        Occ smoker's cough  Cardiovascular: Negative for chest pain and palpitations.  Gastrointestinal: Negative for abdominal pain, blood in stool, constipation and diarrhea.  Endocrine: Negative for polydipsia and polyuria.  Genitourinary: Negative for dysuria, frequency and urgency.  Musculoskeletal: Negative for arthralgias, back pain and myalgias.  Skin: Negative for pallor and rash.  Allergic/Immunologic: Negative for environmental allergies.  Neurological: Negative for dizziness, syncope and headaches.  Hematological: Negative for  adenopathy. Does not bruise/bleed easily.  Psychiatric/Behavioral: Negative for behavioral problems, confusion, decreased concentration and dysphoric mood. The patient is not nervous/anxious.        Poor short term memory at times Family helps out       Objective:   Physical Exam Constitutional:      General: She is not in acute distress.    Appearance: She is well-developed.  HENT:     Head: Normocephalic and atraumatic.  Eyes:     Conjunctiva/sclera: Conjunctivae normal.     Pupils: Pupils are equal, round, and reactive to light.  Neck:     Thyroid: No thyromegaly.     Vascular: No  carotid bruit or JVD.  Cardiovascular:     Rate and Rhythm: Normal rate and regular rhythm.     Heart sounds: Normal heart sounds. No gallop.   Pulmonary:     Effort: Pulmonary effort is normal. No respiratory distress.     Breath sounds: Normal breath sounds. No wheezing or rales.     Comments: Diffusely distant bs   Abdominal:     General: Bowel sounds are normal. There is no distension or abdominal bruit.     Palpations: Abdomen is soft. There is no mass.     Tenderness: There is no abdominal tenderness.     Hernia: No hernia is present.  Musculoskeletal:     Cervical back: Normal range of motion and neck supple.     Right lower leg: No edema.     Left lower leg: No edema.  Lymphadenopathy:     Cervical: No cervical adenopathy.  Skin:    General: Skin is warm and dry.     Coloration: Skin is not pale.     Findings: No rash.     Comments: Solar lentigines diffusely Solar aging  Neurological:     Mental Status: She is alert. Mental status is at baseline.     Sensory: No sensory deficit.     Coordination: Coordination normal.     Deep Tendon Reflexes: Reflexes are normal and symmetric. Reflexes normal.  Psychiatric:        Mood and Affect: Mood normal.        Cognition and Memory: Cognition normal. She exhibits impaired recent memory.     Comments: Mildly blunted affect but more alert  today  Answers questions appropriately           Assessment & Plan:   Problem List Items Addressed This Visit      Cardiovascular and Mediastinum   Aortic atherosclerosis ([place])    Pt is statin intolerant- did discuss possible trial of twice weekly rosuvastatin  Enc healthy diet  Will begin monitoring bp at home No symptoms  Continues to smoke/not interested in cessation unfortunatley        Musculoskeletal and Integument   Intertrigo    Under breasts Enc to keep as dry as possible Continue mycostatin cream        Other   Hyperlipidemia    Intol of all statins thus far Disc goals for lipids and reasons to control them Rev last labs with pt Rev low sat fat diet in detail Pending panel today -diet is fair  Pt may be open to twice weekly rosuvastatin  Disc vascular risks of this with smoking      Relevant Orders   Comprehensive metabolic panel (Completed)   Lipid panel (Completed)   TOBACCO ABUSE    Disc in detail risks of smoking and possible outcomes including copd, vascular/ heart disease, cancer , respiratory and sinus infections  Pt voices understanding Some smokers cough Not interested in quitting Given info on the lung cancer screening program through [facility] - will leet [place] know if interested      Mild cognitive impairment - Primary    Per family doing well  No worsening  Tolerates low dose namenda-will titrate up to 10 mg bid as tolerated Allergic to aricept Disc imp of brain stimulation reading/socializing when safe       Low vitamin B12 level    Pt never started oral B12 as ordered  B12 shot today after checking level inst to get 1000 mcg otc  and take daily  Disc imp of B12 to neruological health      Relevant Orders   Vitamin B12 (Completed)   Anxiety disorder    Very small dose buspar is helpful (3.75 mg bid)  Reviewed stressors/ coping techniques/symptoms/ support sources/ tx options and side effects in detail today        Relevant  Medications   busPIRone (BUSPAR) 15 MG tablet   Elevated blood pressure reading without diagnosis of hypertension    BP: (!) 150/70    Pt thinks this will be lower at home  [place] daughter will monitor and send [place] record of some readings in 2 weeks Disc correct way to check bp  Also enc smoking cessation If no imp will begin tx      Relevant Orders   Comprehensive metabolic panel (Completed)   Lipid panel (Completed)   TSH (Completed)   CBC with Differential/Platelet (Completed)

## 2019-04-16 NOTE — Assessment & Plan Note (Signed)
Under breasts Enc to keep as dry as possible Continue mycostatin cream

## 2019-04-16 NOTE — Assessment & Plan Note (Signed)
Intol of all statins thus far Disc goals for lipids and reasons to control them Rev last labs with pt Rev low sat fat diet in detail Pending panel today -diet is fair  Pt may be open to twice weekly rosuvastatin  Disc vascular risks of this with smoking

## 2019-04-16 NOTE — Assessment & Plan Note (Signed)
Pt never started oral B12 as ordered  B12 shot today after checking level inst to get 1000 mcg otc and take daily  Disc imp of B12 to neruological health

## 2019-04-16 NOTE — Assessment & Plan Note (Signed)
Very small dose buspar is helpful (3.75 mg bid)  Reviewed stressors/ coping techniques/symptoms/ support sources/ tx options and side effects in detail today

## 2019-04-16 NOTE — Assessment & Plan Note (Signed)
Per family doing well  No worsening  Tolerates low dose namenda-will titrate up to 10 mg bid as tolerated Allergic to aricept Disc imp of brain stimulation reading/socializing when safe

## 2019-04-16 NOTE — Assessment & Plan Note (Signed)
BP: (!) 150/70    Pt thinks this will be lower at home  [place] daughter will monitor and send [place] record of some readings in 2 weeks Disc correct way to check bp  Also enc smoking cessation If no imp will begin tx

## 2019-04-16 NOTE — Assessment & Plan Note (Signed)
Pt is statin intolerant- did discuss possible trial of twice weekly rosuvastatin  Enc healthy diet  Will begin monitoring bp at home No symptoms  Continues to smoke/not interested in cessation unfortunatley

## 2019-04-17 ENCOUNTER — Telehealth: Payer: Self-pay | Admitting: Family Medicine

## 2019-04-17 MED ORDER — ROSUVASTATIN CALCIUM 10 MG PO TABS: ORAL_TABLET | ORAL | 3 refills | Status: DC

## 2019-04-17 NOTE — Telephone Encounter (Signed)
-----   Message from Tammi Sou, [place] sent at 04/17/2019  4:39 PM EST ----- Spoke with pt and daughter. Notified them of Dr. Marliss Coots comments regarding lab results. Pt agrees to try the cCestor twice a week ([place])  Lab appt scheduled in 2 months to recheck b12 levels

## 2019-04-21 ENCOUNTER — Ambulatory Visit: Payer: Medicare Other | Attending: Internal Medicine

## 2019-04-21 DIAGNOSIS — Z23 Encounter for immunization: Secondary | ICD-10-CM | POA: Insufficient documentation

## 2019-04-21 NOTE — Progress Notes (Signed)
   Covid-19 Vaccination Clinic  Name:  Tammy Neal    MRN: [id number] DOB: 1951/02/01  04/21/2019  [name] was observed post Covid-19 immunization for 15 minutes without incidence. She was provided with Vaccine Information Sheet and instruction to access the V-Safe system.   [name] was instructed to call 911 with any severe reactions post vaccine: Marland Kitchen Difficulty breathing  . Swelling of your face and throat  . A fast heartbeat  . A bad rash all over your body  . Dizziness and weakness    Immunizations Administered    Name Date Dose VIS Date Route   Pfizer COVID-19 Vaccine 04/21/2019  9:49 AM 0.3 mL 02/01/2019 Intramuscular   Manufacturer: [place]   Lot: [id number]   [place]: [id number]

## 2019-05-14 ENCOUNTER — Ambulatory Visit: Payer: Medicare Other | Attending: Internal Medicine

## 2019-05-14 DIAGNOSIS — Z23 Encounter for immunization: Secondary | ICD-10-CM

## 2019-05-14 NOTE — Progress Notes (Signed)
   Covid-19 Vaccination Clinic  Name:  Tammy Neal    MRN: [id number] DOB: [birth year]  05/14/2019  [name] was observed post Covid-19 immunization for 15 minutes without incident. She was provided with Vaccine Information Sheet and instruction to access the V-Safe system.   [name] was instructed to call 911 with any severe reactions post vaccine: Marland Kitchen Difficulty breathing  . Swelling of face and throat  . A fast heartbeat  . A bad rash all over body  . Dizziness and weakness   Immunizations Administered    Name Date Dose VIS Date Route   Pfizer COVID-19 Vaccine 05/14/2019  8:58 AM 0.3 mL 02/01/2019 Intramuscular   Manufacturer: [place]   Lot: [id number]   [place]: [id number]

## 2019-05-28 ENCOUNTER — Other Ambulatory Visit: Payer: Self-pay | Admitting: Family Medicine

## 2019-05-28 DIAGNOSIS — Z1231 Encounter for screening mammogram for malignant neoplasm of breast: Secondary | ICD-10-CM

## 2019-06-20 ENCOUNTER — Telehealth: Payer: Self-pay | Admitting: Family Medicine

## 2019-06-20 DIAGNOSIS — E538 Deficiency of other specified B group vitamins: Secondary | ICD-10-CM

## 2019-06-20 DIAGNOSIS — E7800 Pure hypercholesterolemia, unspecified: Secondary | ICD-10-CM

## 2019-06-20 NOTE — Telephone Encounter (Signed)
-----   Message from Cloyd Stagers, RT sent at 06/06/2019  1:52 PM EDT ----- Regarding: Lab Orders for Friday 4.30.2021 Please place lab orders for Friday 4.30.2021, appt notes state "labs" Thank you, Dyke Maes RT(R)

## 2019-06-21 ENCOUNTER — Other Ambulatory Visit: Payer: Medicare Other

## 2019-07-19 ENCOUNTER — Other Ambulatory Visit: Payer: Self-pay

## 2019-07-19 ENCOUNTER — Ambulatory Visit
Admission: RE | Admit: 2019-07-19 | Discharge: 2019-07-19 | Disposition: A | Discharge status: Home / Self Care | Payer: Medicare Other | Source: Ambulatory Visit | Attending: Family Medicine | Admitting: Family Medicine

## 2019-07-19 DIAGNOSIS — Z1231 Encounter for screening mammogram for malignant neoplasm of breast: Secondary | ICD-10-CM | POA: Diagnosis not present

## 2019-07-24 ENCOUNTER — Other Ambulatory Visit: Payer: Self-pay | Admitting: Family Medicine

## 2019-07-24 DIAGNOSIS — R928 Other abnormal and inconclusive findings on diagnostic imaging of breast: Secondary | ICD-10-CM

## 2019-08-02 ENCOUNTER — Other Ambulatory Visit: Payer: Self-pay

## 2019-08-02 ENCOUNTER — Ambulatory Visit
Admission: RE | Admit: 2019-08-02 | Discharge: 2019-08-02 | Disposition: A | Discharge status: Home / Self Care | Payer: Medicare Other | Source: Ambulatory Visit | Attending: Family Medicine | Admitting: Family Medicine

## 2019-08-02 ENCOUNTER — Ambulatory Visit: Payer: Medicare Other

## 2019-08-02 DIAGNOSIS — R928 Other abnormal and inconclusive findings on diagnostic imaging of breast: Secondary | ICD-10-CM | POA: Diagnosis not present

## 2020-01-06 ENCOUNTER — Other Ambulatory Visit: Payer: Self-pay | Admitting: Family Medicine

## 2020-01-06 ENCOUNTER — Other Ambulatory Visit: Payer: Medicare Other

## 2020-01-06 ENCOUNTER — Telehealth (INDEPENDENT_AMBULATORY_CARE_PROVIDER_SITE_OTHER): Payer: Medicare Other | Admitting: Family Medicine

## 2020-01-06 ENCOUNTER — Encounter: Payer: Self-pay | Admitting: Family Medicine

## 2020-01-06 ENCOUNTER — Other Ambulatory Visit: Payer: Self-pay

## 2020-01-06 DIAGNOSIS — F17200 Nicotine dependence, unspecified, uncomplicated: Secondary | ICD-10-CM

## 2020-01-06 DIAGNOSIS — R059 Cough, unspecified: Secondary | ICD-10-CM | POA: Diagnosis not present

## 2020-01-06 MED ORDER — HYDROCOD POLST-CPM POLST ER 10-8 MG/5ML PO SUER: 5.0000 mL | Freq: Two times a day (BID) | ORAL | 0 refills | Status: DC | PRN

## 2020-01-06 MED ORDER — BENZONATATE 200 MG PO CAPS: 200.0000 mg | ORAL_CAPSULE | Freq: Three times a day (TID) | ORAL | 5 refills | Status: DC | PRN

## 2020-01-06 NOTE — Progress Notes (Signed)
Virtual Visit via Video Note  I connected with Tammy Neal on 01/06/20 at 12:00 PM EST by a video enabled telemedicine application and verified that I am speaking with the correct person using two identifiers.  Location: Patient: home Provider: office   I discussed the limitations of evaluation and management by telemedicine and the availability of in person appointments. The patient expressed understanding and agreed to proceed.  Parties involved in encounter  Patient: Tammy Neal  Provider:  Loura Pardon MD    History of Present Illness: Pt presents with c/o cough   Cough for about 2 weeks  Just a little phlegm - all clear  Worse at night when lying down  Some sinus drainage  Not getting better   No other symptoms   No sore throat  No headache  No sinus pain or pressure   No loss of taste or smell  No loss of appetite    No fever  No new sob  No wheezing  No n/v   No sneezing or itchy eyes    otc Robitussin DM  Honey mucinex - severe cold   Immunized with pfizer vaccine in march  Has not had a booster yet   Smoker - has cut down quite a bit  A carton lasted a month  Was not trying to quit (just does not want to go out to smoke)   Patient Active Problem List   Diagnosis Date Noted  . Elevated blood pressure reading without diagnosis of hypertension 04/16/2019  . Low vitamin B12 level 03/20/2018  . Anxiety disorder 03/20/2018  . OA (osteoarthritis) of hip 11/16/2016  . Aortic atherosclerosis ([place]) 11/06/2016  . Pre-operative exam 11/04/2016  . Arthritis of left hip 07/13/2016  . Estrogen deficiency 12/29/2015  . Cough 04/30/2015  . Mild cognitive impairment 01/02/2015  . Intertrigo 11/02/2011  . Routine general medical examination at a health care facility 10/26/2011  . Gynecological examination 07/05/2010  . DEPRESSION 05/03/2010  . Hyperlipidemia 02/26/2007  . TOBACCO ABUSE 02/26/2007  . PSORIASIS 02/26/2007   Past Medical History:   Diagnosis Date  . Allergy    allergic rhinitis  . Arthritis   . COPD (chronic obstructive pulmonary disease) ([place])   . Hyperlipidemia    no meds  . Neck pain, chronic    with fusion  . Osteoporosis   . Psoriasis    Past Surgical History:  Procedure Laterality Date  . ABDOMINAL HYSTERECTOMY     endometriosis 1 ovary left  . APPENDECTOMY    . NECK SURGERY    . SPINE SURGERY  1977   cervical fusion Dr Carloyn Manner x2  . 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   Procedure: LEFT TOTAL HIP ARTHROPLASTY ANTERIOR APPROACH;  Surgeon: Gaynelle Arabian, MD;  Location: [facility];  Service: Orthopedics;  Laterality: Left;   Social History   Tobacco Use  . Smoking status: Current Every Day Smoker    Packs/day: 0.50  . Smokeless tobacco: Never Used  Substance Use Topics  . Alcohol use: No    Alcohol/week: 0.0 standard drinks  . Drug use: No   Family History  Problem Relation Age of Onset  . Hypertension Mother   . Diabetes Mother   . Stroke Mother 41  . Asthma Father   . COPD Father   . Colon cancer Neg Hx   . Esophageal cancer Neg Hx   . Ulcerative colitis Neg Hx   . Stomach cancer Neg Hx    Allergies  Allergen Reactions  .  Voltaren [Diclofenac Sodium] Anaphylaxis  . Aricept [Donepezil Hcl] Itching    Itchy, jittery (no hives)  . Aspirin Other (See Comments)    REACTION: GI upset  . Atorvastatin     REACTION: pain  . Simvastatin     REACTION: muscle pain   Current Outpatient Medications on File Prior to Visit  Medication Sig Dispense Refill  . acetaminophen ([facility] 8 HOUR PAIN RELIEF) 650 MG CR tablet Take 650 mg by mouth 2 (two) times daily as needed for pain.     . busPIRone (BUSPAR) 15 MG tablet Take 1/4 tablet twice daily 45 tablet 3  . memantine (NAMENDA) 10 MG tablet Take 1 tablet (10 mg total) by mouth 2 (two) times daily. 180 tablet 3  . nystatin cream (MYCOSTATIN) Apply 1 application topically 2 (two) times daily. To affected area 30 g 0  . vitamin B-12 (CYANOCOBALAMIN) 1000  MCG tablet Take 1,000 mcg by mouth daily.    . rosuvastatin (CRESTOR) 10 MG tablet Take one pill by mouth twice weekly (Patient not taking: Reported on 01/06/2020) 24 tablet 3   No current facility-administered medications on file prior to visit.      Observations/Objective: Patient appears well, in no distress Weight is baseline  No facial swelling or asymmetry Normal voice-not hoarse and no slurred speech No obvious tremor or mobility impairment Moving neck and UEs normally Able to hear the call well  No cough or shortness of breath during interview   (no wheeze with forced exp) Cognitive status is baseline, good historian No skin changes on face or neck , no rash or pallor Affect is normal    Assessment and Plan: Review of Systems  Constitutional: Negative for chills, fever and malaise/fatigue.  HENT: Negative for congestion, ear pain, sinus pain and sore throat.        A little post nasal drip  Eyes: Negative for blurred vision, discharge and redness.  Respiratory: Positive for cough and sputum production. Negative for hemoptysis, shortness of breath, wheezing and stridor.   Cardiovascular: Negative for chest pain, palpitations and leg swelling.  Gastrointestinal: Negative for abdominal pain, diarrhea, nausea and vomiting.  Musculoskeletal: Negative for myalgias.  Skin: Negative for rash.  Neurological: Negative for dizziness and headaches.   Problem List Items Addressed This Visit      Other   TOBACCO ABUSE   Cough - Primary    Smoker's cough vs viral vs allergy  Scant phlegm/clear No other symptoms  No wheeze or sob  Px tessalon tid and tussionex (caution sedation)  inst to call if wheeze /sob or any other new symptoms  Ordered covid swab to come this afternoon  Will isolate until results           Follow Up Instructions: Drink fluids and rest  The office will call you to set up a covid swab  Try the tessalon three times daily tussionex as needed (bedtime)  with caution of sedation/falls Update if not starting to improve in a week or if worsening   If any new symptoms or chest tightness please call   I discussed the assessment and treatment plan with the patient. The patient was provided an opportunity to ask questions and all were answered. The patient agreed with the plan and demonstrated an understanding of the instructions.   The patient was advised to call back or seek an in-person evaluation if the symptoms worsen or if the condition fails to improve as anticipated.     [facility],  MD

## 2020-01-06 NOTE — Assessment & Plan Note (Signed)
Smoker's cough vs viral vs allergy  Scant phlegm/clear No other symptoms  No wheeze or sob  Px tessalon tid and tussionex (caution sedation)  inst to call if wheeze /sob or any other new symptoms  Ordered covid swab to come this afternoon  Will isolate until results

## 2020-01-06 NOTE — Patient Instructions (Signed)
Drink fluids and rest  The office will call you to set up a covid swab  Try the tessalon three times daily tussionex as needed (bedtime) with caution of sedation/falls Update if not starting to improve in a week or if worsening   If any new symptoms or chest tightness please call

## 2020-01-07 ENCOUNTER — Other Ambulatory Visit: Payer: Medicare Other

## 2020-01-07 ENCOUNTER — Other Ambulatory Visit: Payer: Self-pay | Admitting: Family Medicine

## 2020-01-07 DIAGNOSIS — R059 Cough, unspecified: Secondary | ICD-10-CM | POA: Diagnosis not present

## 2020-01-09 LAB — SARS-COV-2, NAA 2 DAY TAT

## 2020-01-09 LAB — NOVEL CORONAVIRUS, NAA: SARS-CoV-2, NAA: NOT DETECTED

## 2020-01-10 DIAGNOSIS — R109 Unspecified abdominal pain: Secondary | ICD-10-CM | POA: Diagnosis not present

## 2020-01-10 DIAGNOSIS — I714 Abdominal aortic aneurysm, without rupture: Secondary | ICD-10-CM | POA: Diagnosis not present

## 2020-01-10 DIAGNOSIS — R112 Nausea with vomiting, unspecified: Secondary | ICD-10-CM | POA: Diagnosis not present

## 2020-01-10 DIAGNOSIS — K5730 Diverticulosis of large intestine without perforation or abscess without bleeding: Secondary | ICD-10-CM | POA: Diagnosis not present

## 2020-01-10 DIAGNOSIS — K449 Diaphragmatic hernia without obstruction or gangrene: Secondary | ICD-10-CM | POA: Diagnosis not present

## 2020-01-10 DIAGNOSIS — N3289 Other specified disorders of bladder: Secondary | ICD-10-CM | POA: Diagnosis not present

## 2020-01-10 DIAGNOSIS — R197 Diarrhea, unspecified: Secondary | ICD-10-CM | POA: Diagnosis not present

## 2020-01-15 ENCOUNTER — Telehealth: Payer: Self-pay | Admitting: *Deleted

## 2020-01-15 NOTE — Telephone Encounter (Signed)
Patients daughter Ronny Bacon left a voicemail stating that she took her mom home with her to [place], [place] to spend time with her. Ronny Bacon stated that she had to take her mom to the ER and she was admitted with a UTI and diverticulitis. Ronny Bacon stated that since her mom has gotten home from the hospital all she wants to do is sleep and she wants to know if this is normal? Ronny Bacon stated that she also wanted to let Dr. Glori Bickers know that her mom has not smoked since coming home from the hospital.

## 2020-01-15 NOTE — Telephone Encounter (Signed)
Not uncommon to be very tired after an infection/hospitalization but if she gets worse develops other symptoms (abd pain/ urinary symptoms/fever/acute confusion) then they need to go to the ER for further eval  Thanks for letting us know

## 2020-01-15 NOTE — Telephone Encounter (Signed)
Daughter notified of Dr. Marliss Coots comments. Daughter said Tammy Neal isn't having any sxs just wants to sleep all the time but will keep an eye on her and keep [place] posted

## 2020-02-19 ENCOUNTER — Encounter: Payer: Self-pay | Admitting: Family Medicine

## 2020-02-19 DIAGNOSIS — M542 Cervicalgia: Secondary | ICD-10-CM

## 2020-02-19 NOTE — Addendum Note (Signed)
Addended by: Roxy Manns A on: 02/19/2020 03:34 PM   Modules accepted: Orders

## 2020-02-28 ENCOUNTER — Encounter: Payer: Self-pay | Admitting: Orthopaedic Surgery

## 2020-02-28 ENCOUNTER — Ambulatory Visit: Payer: Medicare HMO | Admitting: Orthopaedic Surgery

## 2020-02-28 DIAGNOSIS — M47812 Spondylosis without myelopathy or radiculopathy, cervical region: Secondary | ICD-10-CM | POA: Diagnosis not present

## 2020-02-28 NOTE — Progress Notes (Signed)
Office Visit Note   Patient: Tammy Neal           Date of Birth: 1950/10/30           MRN: [id number] Visit Date: 02/28/2020              Requested by: Tower, Wynelle Fanny, MD [place],  [place] [address] PCP: Abner Greenspan, MD   Assessment & Plan: Visit Diagnoses:  1. Spondylosis without myelopathy or radiculopathy, cervical region     Plan: Patient can use ice, heat, Aspercreme, try TENS unit she can find at [facility].  Home cervical traction set given since she gets good relief of her pain with distraction.  We will check her back again in 8 weeks.  If she is having persistent symptoms we can discuss possible cervical MRI imaging.  Follow-Up Instructions: Return in about 8 weeks (around 04/24/2020).   Orders:  No orders of the defined types were placed in this encounter.  No orders of the defined types were placed in this encounter.     Procedures: No procedures performed   Clinical Data: No additional findings.   Subjective: Chief Complaint  Patient presents with  . Neck - Pain    HPI 70 year old female here with her daughter with problems with posterior neck pain occipital headaches.  This been going on for 4 to 5 months.  First surgery was done by Dr. Carloyn Manner in 1997.  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  She did well after that and had been having minimal neck problems up until 4 to 5 months ago when she has had gradual significant increase in neck pain.  She seen a chiropractor had x-rays.  These x-rays were on the daughter's phone and AP lateral bleak images shows that the three-level fusion is healed and she has narrowing of the disc base above the fusion at the C3-4 level with spurring and osteophyte formation.  Patient denies any problems with her hands no numbness or tingling in the fingers no myelopathic type symptoms.  She is taking Tylenol which is  helped somewhat.  She states her chiropractor had recommended laser treatment.  Review of Systems all other systems are noncontributory to HPI.  Patient had a hip replacement by Dr. Elmyra Ricks Objective: Vital Signs: BP (!) 169/81   Pulse 73   Ht 5\' 2"  (1.575 m)   Wt 128 lb (58.1 kg)   BMI 23.41 kg/m   Physical Exam Constitutional:      Appearance: She is well-developed.  HENT:     Head: Normocephalic.     Right Ear: External ear normal.     Left Ear: External ear normal.  Eyes:     Pupils: Pupils are equal, round, and reactive to light.  Neck:     Thyroid: No thyromegaly.     Trachea: No tracheal deviation.  Cardiovascular:     Rate and Rhythm: Normal rate.  Pulmonary:     Effort: Pulmonary effort is normal.  Abdominal:     Palpations: Abdomen is soft.  Skin:    General: Skin is warm and dry.  Neurological:     Mental Status: She is alert and oriented to person, place, and time.  Psychiatric:        Mood and Affect: Mood and affect normal.        Behavior: Behavior normal.  Ortho Exam well-healed anterior and posterior neck incisions.  She has tenderness paracervical muscles upper cervical region.  No brachial plexus tenderness negative Spurling.  60% rotation of her neck with mild discomfort.  Normal heel toe gait.  She gets significant relief with cervical distraction and some increase in pain with cervical compression.  Negative Lhermitte  Specialty Comments:  No specialty comments available.  Imaging: No results found.   PMFS History: Patient Active Problem List   Diagnosis Date Noted  . Spondylosis without myelopathy or radiculopathy, cervical region 02/28/2020  . Elevated blood pressure reading without diagnosis of hypertension 04/16/2019  . Low vitamin B12 level 03/20/2018  . Anxiety disorder 03/20/2018  . OA (osteoarthritis) of hip 11/16/2016  . Aortic atherosclerosis ([place]) 11/06/2016  . Pre-operative exam 11/04/2016  . Arthritis of left hip  07/13/2016  . Estrogen deficiency 12/29/2015  . Cough 04/30/2015  . Mild cognitive impairment 01/02/2015  . Intertrigo 11/02/2011  . Routine general medical examination at a health care facility 10/26/2011  . Gynecological examination 07/05/2010  . DEPRESSION 05/03/2010  . Neck pain 06/10/2008  . Hyperlipidemia 02/26/2007  . TOBACCO ABUSE 02/26/2007  . PSORIASIS 02/26/2007   Past Medical History:  Diagnosis Date  . Allergy    allergic rhinitis  . Arthritis   . COPD (chronic obstructive pulmonary disease) ([place])   . Hyperlipidemia    no meds  . Neck pain, chronic    with fusion  . Osteoporosis   . Psoriasis     Family History  Problem Relation Age of Onset  . Hypertension Mother   . Diabetes Mother   . Stroke Mother 29  . Asthma Father   . COPD Father   . Colon cancer Neg Hx   . Esophageal cancer Neg Hx   . Ulcerative colitis Neg Hx   . Stomach cancer Neg Hx     Past Surgical History:  Procedure Laterality Date  . ABDOMINAL HYSTERECTOMY     endometriosis 1 ovary left  . APPENDECTOMY    . NECK SURGERY    . SPINE SURGERY  1977   cervical fusion Dr Carloyn Manner x2  . 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   Procedure: LEFT TOTAL HIP ARTHROPLASTY ANTERIOR APPROACH;  Surgeon: Gaynelle Arabian, MD;  Location: [facility];  Service: Orthopedics;  Laterality: Left;   Social History   Occupational History  . Not on file  Tobacco Use  . Smoking status: Current Every Day Smoker    Packs/day: 0.50  . Smokeless tobacco: Never Used  Substance and Sexual Activity  . Alcohol use: No    Alcohol/week: 0.0 standard drinks  . Drug use: No  . Sexual activity: Never

## 2020-03-25 DIAGNOSIS — M47892 Other spondylosis, cervical region: Secondary | ICD-10-CM | POA: Diagnosis not present

## 2020-03-25 DIAGNOSIS — M542 Cervicalgia: Secondary | ICD-10-CM | POA: Diagnosis not present

## 2020-03-25 DIAGNOSIS — M9901 Segmental and somatic dysfunction of cervical region: Secondary | ICD-10-CM | POA: Diagnosis not present

## 2020-03-26 DIAGNOSIS — M47892 Other spondylosis, cervical region: Secondary | ICD-10-CM | POA: Diagnosis not present

## 2020-03-26 DIAGNOSIS — M542 Cervicalgia: Secondary | ICD-10-CM | POA: Diagnosis not present

## 2020-03-26 DIAGNOSIS — M9901 Segmental and somatic dysfunction of cervical region: Secondary | ICD-10-CM | POA: Diagnosis not present

## 2020-04-27 ENCOUNTER — Telehealth: Payer: Self-pay

## 2020-04-27 ENCOUNTER — Ambulatory Visit (HOSPITAL_COMMUNITY): Admit: 2020-04-27 | Discharge status: Against Medical Advice | Payer: Self-pay

## 2020-04-27 NOTE — ED Notes (Signed)
TC to Pt at request of Provider to request Pt go to ED for Eval of sx"s . Daughter was going to go to PCP back .

## 2020-04-27 NOTE — Telephone Encounter (Signed)
I will watch for correspondence from UC, thanks Please call and check on her tomorrow and update me

## 2020-04-27 NOTE — Telephone Encounter (Signed)
Roselyn Reef said that she scheduled appt at [facility] on [facility] and then Roselyn Reef was notified by [facility] that PA felt that pt would need to go to ED that the UC did not have needed equipment for pts testing. Roselyn Reef called me back and I spoke with Jenny Reichmann at [facility] and she said she did not talk with pt or pts family but PA probably thought that pt would need testing that was not available at [facility]. Jenny Reichmann said they could do EKG but no other testing. I relayed info to [facility] and she said she has appt at [facility] at 4pm and will have to take her mom to ED after that appt due to pt's memory needing another person in room during exam and any testing. I apologized but reviewed again what Jenny Reichmann said was the reason and that they were concerned about pts well being. Roselyn Reef voiced understanding and she said she guessed she would take her mom after her 4PM appt and Roselyn Reef was going to call her sister in [place] also. Sending note to Dr Glori Bickers.

## 2020-04-27 NOTE — Telephone Encounter (Signed)
Roselyn Reef (DPR signed) said 1 - 2 months ago that pt was outside and was lightheaded not sure of reason; pt did not lose vision but she was very lightheaded; pt cannot remember if she was having any other symptoms at that time. Roselyn Reef said she thinks pts memory is worsening also and wants to know if should adjust med or what is next step. Pt did see surgeon about her neck and is presently using a tens unit and aspercream. When pt visits her other daughter pt has been getting laser therapy from a chiropractor for the neck issue. On 04/25/20 pt had lightheadedness, blurred vision and then "things went black"; pt could not see for few mins but could hear other people talking. Pt is very tired. Pt has also had lt arm pain but thinks that is related to covid booster in the lt arm. pts daughter has appt this afternoon in [place] and she will take pt to [facility] on [address] At that time.Roselyn Reef will cb on 04/28/20 with update. UC & ED precautions given and pt voiced understanding.sending note to DR [employer].

## 2020-04-27 NOTE — Telephone Encounter (Signed)
Aware, will watch for correspondence, thanks

## 2020-04-29 ENCOUNTER — Other Ambulatory Visit: Payer: Self-pay

## 2020-04-29 ENCOUNTER — Ambulatory Visit (INDEPENDENT_AMBULATORY_CARE_PROVIDER_SITE_OTHER): Payer: Medicare HMO | Admitting: Family Medicine

## 2020-04-29 ENCOUNTER — Encounter: Payer: Self-pay | Admitting: Family Medicine

## 2020-04-29 VITALS — BP 162/80 | HR 65 | Temp 96.9°F | Ht 62.0 in | Wt 132.4 lb

## 2020-04-29 DIAGNOSIS — E538 Deficiency of other specified B group vitamins: Secondary | ICD-10-CM

## 2020-04-29 DIAGNOSIS — F17200 Nicotine dependence, unspecified, uncomplicated: Secondary | ICD-10-CM | POA: Diagnosis not present

## 2020-04-29 DIAGNOSIS — I10 Essential (primary) hypertension: Secondary | ICD-10-CM | POA: Diagnosis not present

## 2020-04-29 DIAGNOSIS — G3184 Mild cognitive impairment, so stated: Secondary | ICD-10-CM | POA: Diagnosis not present

## 2020-04-29 DIAGNOSIS — E7800 Pure hypercholesterolemia, unspecified: Secondary | ICD-10-CM | POA: Diagnosis not present

## 2020-04-29 DIAGNOSIS — F411 Generalized anxiety disorder: Secondary | ICD-10-CM | POA: Diagnosis not present

## 2020-04-29 DIAGNOSIS — J449 Chronic obstructive pulmonary disease, unspecified: Secondary | ICD-10-CM | POA: Diagnosis not present

## 2020-04-29 DIAGNOSIS — R059 Cough, unspecified: Secondary | ICD-10-CM | POA: Diagnosis not present

## 2020-04-29 MED ORDER — AMLODIPINE BESYLATE 5 MG PO TABS
5.0000 mg | ORAL_TABLET | Freq: Every day | ORAL | 3 refills | Status: DC
Start: 2020-04-29 — End: 2021-07-29

## 2020-04-29 NOTE — Assessment & Plan Note (Signed)
BP: (!) 162/80    This remains high/ also had check at home with systolic in 824J Will start amlodipine  inst to call if any side effects or problems  Labs today  F/u in 4-6 weeks

## 2020-04-29 NOTE — Patient Instructions (Addendum)
Let me know if you would like to see neurology for memory problems   If you want to see a dermatologist for the spot on your face let me know  It may be a cyst   Start the amlodpine for blood pressure 5 mg daily  If side effects or problems please let us know  If dizzy spells return let me know   Follow up in 4-6 weeks   Labs today

## 2020-04-29 NOTE — Assessment & Plan Note (Signed)
Improved with less cigarettes

## 2020-04-29 NOTE — Assessment & Plan Note (Signed)
Slowly progressing  Intol of aricept Taking namenda 10 mg bid  Some interest in seeing neurology- she will disc with family and let us know No safety concerns at this time

## 2020-04-29 NOTE — Assessment & Plan Note (Signed)
Pt stopped her twice weekly crestor -unsure why  Will not re start it yet  Lab today  Diet is fair  Disc goals for lipids and reasons to control them Rev last labs with pt Rev low sat fat diet in detail

## 2020-04-29 NOTE — Assessment & Plan Note (Signed)
buspar continues to help her  Plan to continue it  Struggling with slow memory loss  Mood is good today

## 2020-04-29 NOTE — Assessment & Plan Note (Signed)
Commended on cutting down- (had quit for a while)  Smoking for stress on /off

## 2020-04-29 NOTE — Assessment & Plan Note (Signed)
Level today  Had a B12 shot a year ago followed by oral supplementation 1000 mcg daily

## 2020-04-29 NOTE — Progress Notes (Signed)
Subjective:    Patient ID: Tammy Neal, female    DOB: Jul 24, 1950, 70 y.o.   MRN: [id number]  This visit occurred during the SARS-CoV-2 public health emergency.  Safety protocols were in place, including screening questions prior to the visit, additional usage of staff PPE, and extensive cleaning of exam room while observing appropriate contact time as indicated for disinfecting solutions.    HPI Pt presents for f/u of chronic health problems   Wt Readings from Last 3 Encounters:  04/29/20 132 lb 7 oz (60.1 kg)  02/28/20 128 lb (58.1 kg)  04/16/19 123 lb 6 oz (56 kg)   24.22 kg/m   Blood pressure BP Readings from Last 3 Encounters:  04/29/20 (!) 162/80  02/28/20 (!) 169/81  04/16/19 (!) 150/70   Pulse Readings from Last 3 Encounters:  04/29/20 65  02/28/20 73  04/16/19 70    Physically feeling fine   Had an episode of dizziness last week  Blurred vision for a few seconds  (may have gotten up too fast) She was eating - and was able to go back to eating /sitting down She told family about that Monday  No headache   Smoking status - had quit for a while entirely Now only smokes in stressful situations /agitation  Tries not to smoke  Generally less than 5 per day (and just a few draws from each cig)  Still has a smoker's cough at night    Takes namenda 10 mg bid for cognitive impairment Does not tolerate aricept  Has progressed    buspar for anxiety -that continues to help   Was on crestor for cholesterol Lab Results  Component Value Date   CHOL 207 (H) 04/16/2019   HDL 37.60 (L) 04/16/2019   LDLDIRECT 121.0 04/16/2019   TRIG 210.0 (H) 04/16/2019   CHOLHDL 6 04/16/2019   B12 def Takes 1000 mcg oral daily  Lab Results  Component Value Date   VITAMINB12 200 (L) 04/16/2019  last visit also had a B12 shot (about a year ago)   Arm pain after her covid booster The spot of vaccination   Neck problems-saw orthopedic  Doing exercises/heat tx/TENS unit    She is getting laser treatment for neck and arm (with massage)  Uses asper cream   Skin lesion R face ? Cyst Not bothersome  Patient Active Problem List   Diagnosis Date Noted  . Essential hypertension 04/29/2020  . Spondylosis without myelopathy or radiculopathy, cervical region 02/28/2020  . Elevated blood pressure reading without diagnosis of hypertension 04/16/2019  . Low vitamin B12 level 03/20/2018  . Anxiety disorder 03/20/2018  . OA (osteoarthritis) of hip 11/16/2016  . Aortic atherosclerosis ([place]) 11/06/2016  . Pre-operative exam 11/04/2016  . Arthritis of left hip 07/13/2016  . Estrogen deficiency 12/29/2015  . Cough 04/30/2015  . Mild cognitive impairment 01/02/2015  . Intertrigo 11/02/2011  . Routine general medical examination at a health care facility 10/26/2011  . Gynecological examination 07/05/2010  . DEPRESSION 05/03/2010  . Neck pain 06/10/2008  . Hyperlipidemia 02/26/2007  . TOBACCO ABUSE 02/26/2007  . PSORIASIS 02/26/2007   Past Medical History:  Diagnosis Date  . Allergy    allergic rhinitis  . Arthritis   . COPD (chronic obstructive pulmonary disease) ([place])   . Hyperlipidemia    no meds  . Neck pain, chronic    with fusion  . Osteoporosis   . Psoriasis    Past Surgical History:  Procedure Laterality Date  . ABDOMINAL  HYSTERECTOMY     endometriosis 1 ovary left  . APPENDECTOMY    . NECK SURGERY    . SPINE SURGERY  1977   cervical fusion Dr Carloyn Manner x2  . 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   Procedure: LEFT TOTAL HIP ARTHROPLASTY ANTERIOR APPROACH;  Surgeon: Gaynelle Arabian, MD;  Location: [facility];  Service: Orthopedics;  Laterality: Left;   Social History   Tobacco Use  . Smoking status: Current Every Day Smoker    Packs/day: 0.25  . Smokeless tobacco: Never Used  Substance Use Topics  . Alcohol use: No    Alcohol/week: 0.0 standard drinks  . Drug use: No   Family History  Problem Relation Age of Onset  . Hypertension Mother    . Diabetes Mother   . Stroke Mother 37  . Asthma Father   . COPD Father   . Colon cancer Neg Hx   . Esophageal cancer Neg Hx   . Ulcerative colitis Neg Hx   . Stomach cancer Neg Hx    Allergies  Allergen Reactions  . Voltaren [Diclofenac Sodium] Anaphylaxis  . Diclofenac Sodium Hives and Itching  . Aricept [Donepezil Hcl] Itching    Itchy, jittery (no hives)  . Aspirin Other (See Comments)    REACTION: GI upset  . Atorvastatin     REACTION: pain  . Simvastatin     REACTION: muscle pain   Current Outpatient Medications on File Prior to Visit  Medication Sig Dispense Refill  . acetaminophen (TYLENOL) 650 MG CR tablet Take 650 mg by mouth 2 (two) times daily as needed for pain.     . busPIRone (BUSPAR) 15 MG tablet Take 1/4 tablet twice daily 45 tablet 3  . memantine (NAMENDA) 10 MG tablet Take 1 tablet (10 mg total) by mouth 2 (two) times daily. 180 tablet 3  . nystatin cream (MYCOSTATIN) Apply 1 application topically 2 (two) times daily. To affected area 30 g 0  . vitamin B-12 (CYANOCOBALAMIN) 1000 MCG tablet Take 1,000 mcg by mouth daily.     No current facility-administered medications on file prior to visit.    Review of Systems  Constitutional: Negative for activity change, appetite change, fatigue, fever and unexpected weight change.  HENT: Negative for congestion, ear pain, rhinorrhea, sinus pressure and sore throat.   Eyes: Negative for pain, redness and visual disturbance.  Respiratory: Negative for cough, shortness of breath and wheezing.   Cardiovascular: Negative for chest pain and palpitations.  Gastrointestinal: Negative for abdominal pain, blood in stool, constipation and diarrhea.  Endocrine: Negative for polydipsia and polyuria.  Genitourinary: Negative for dysuria, frequency and urgency.  Musculoskeletal: Negative for arthralgias, back pain and myalgias.  Skin: Negative for pallor and rash.  Allergic/Immunologic: Negative for environmental allergies.   Neurological: Negative for dizziness, syncope and headaches.       Dizziness was brief and is resolved  Hematological: Negative for adenopathy. Does not bruise/bleed easily.  Psychiatric/Behavioral: Positive for decreased concentration. Negative for dysphoric mood. The patient is not nervous/anxious.        Cognitive impairment       Objective:   Physical Exam Constitutional:      General: She is not in acute distress.    Appearance: Normal appearance. She is well-developed, normal weight and well-nourished. She is not ill-appearing.  HENT:     Head: Normocephalic and atraumatic.     Mouth/Throat:     Mouth: Oropharynx is clear and moist.  Eyes:     Extraocular Movements:  EOM normal.     Conjunctiva/sclera: Conjunctivae normal.     Pupils: Pupils are equal, round, and reactive to light.  Neck:     Thyroid: No thyromegaly.     Vascular: No carotid bruit or JVD.  Cardiovascular:     Rate and Rhythm: Normal rate and regular rhythm.     Pulses: Normal pulses and intact distal pulses.     Heart sounds: Normal heart sounds. No gallop.   Pulmonary:     Effort: Pulmonary effort is normal. No respiratory distress.     Breath sounds: Normal breath sounds. No stridor. No wheezing or rales.     Comments: Diffusely distant bs No wheezing and No crackles Abdominal:     General: Bowel sounds are normal. There is no distension or abdominal bruit.     Palpations: Abdomen is soft. There is no mass.     Tenderness: There is no abdominal tenderness.  Musculoskeletal:        General: No edema.     Cervical back: Normal range of motion and neck supple.     Right lower leg: No edema.     Left lower leg: No edema.  Lymphadenopathy:     Cervical: No cervical adenopathy.  Skin:    General: Skin is warm and dry.     Findings: No rash.     Comments: Lesion on R face  3 mm soft lump /light in color and slightly mobile (resembling B9 nevus or cyst)     Neurological:     Mental Status: She is  alert.     Sensory: No sensory deficit.     Coordination: Coordination normal.     Deep Tendon Reflexes: Reflexes are normal and symmetric. Reflexes normal.  Psychiatric:        Attention and Perception: Attention normal.        Mood and Affect: Mood and affect and mood normal.        Speech: Speech normal.        Cognition and Memory: She exhibits impaired recent memory.     Comments: Pleasant  Answers questions appropriately  Good mood today           Assessment & Plan:   Problem List Items Addressed This Visit      Cardiovascular and Mediastinum   Essential hypertension    BP: (!) 162/80    This remains high/ also had check at home with systolic in 626R Will start amlodipine  inst to call if any side effects or problems  Labs today  F/u in 4-6 weeks       Relevant Medications   amLODipine (NORVASC) 5 MG tablet   Other Relevant Orders   CBC with Differential/Platelet   Comprehensive metabolic panel   Lipid panel   TSH     Other   Hyperlipidemia    Pt stopped her twice weekly crestor -unsure why  Will not re start it yet  Lab today  Diet is fair  Disc goals for lipids and reasons to control them Rev last labs with pt Rev low sat fat diet in detail       Relevant Medications   amLODipine (NORVASC) 5 MG tablet   Other Relevant Orders   Lipid panel   TOBACCO ABUSE    Commended on cutting down- (had quit for a while)  Smoking for stress on /off      Mild cognitive impairment - Primary    Slowly progressing  Intol of aricept  Taking namenda 10 mg bid  Some interest in seeing neurology- she will disc with family and let us know No safety concerns at this time         Cough    Improved with less cigarettes       Low vitamin B12 level    Level today  Had a B12 shot a year ago followed by oral supplementation 1000 mcg daily        Relevant Orders   Vitamin B12   Anxiety disorder    buspar continues to help her  Plan to continue it   Struggling with slow memory loss  Mood is good today

## 2020-04-30 LAB — CBC WITH DIFFERENTIAL/PLATELET
Basophils Absolute: 0.1 10*3/uL (ref 0.0–0.1)
Basophils Relative: 1.2 % (ref 0.0–3.0)
Eosinophils Absolute: 0.1 10*3/uL (ref 0.0–0.7)
Eosinophils Relative: 1.7 % (ref 0.0–5.0)
HCT: 44.1 % (ref 36.0–46.0)
Hemoglobin: 14.7 g/dL (ref 12.0–15.0)
Lymphocytes Relative: 22.3 % (ref 12.0–46.0)
Lymphs Abs: 1.8 10*3/uL (ref 0.7–4.0)
MCHC: 33.3 g/dL (ref 30.0–36.0)
MCV: 94.1 fl (ref 78.0–100.0)
Monocytes Absolute: 0.8 10*3/uL (ref 0.1–1.0)
Monocytes Relative: 9.5 % (ref 3.0–12.0)
Neutro Abs: 5.3 10*3/uL (ref 1.4–7.7)
Neutrophils Relative %: 65.3 % (ref 43.0–77.0)
Platelets: 232 10*3/uL (ref 150.0–400.0)
RBC: 4.68 Mil/uL (ref 3.87–5.11)
RDW: 13.8 % (ref 11.5–15.5)
WBC: 8.1 10*3/uL (ref 4.0–10.5)

## 2020-04-30 LAB — COMPREHENSIVE METABOLIC PANEL
ALT: 8 U/L (ref 0–35)
AST: 13 U/L (ref 0–37)
Albumin: 3.8 g/dL (ref 3.5–5.2)
Alkaline Phosphatase: 68 U/L (ref 39–117)
BUN: 13 mg/dL (ref 6–23)
CO2: 33 mEq/L — ABNORMAL HIGH (ref 19–32)
Calcium: 9.2 mg/dL (ref 8.4–10.5)
Chloride: 104 mEq/L (ref 96–112)
Creatinine, Ser: 0.87 mg/dL (ref 0.40–1.20)
GFR: 67.34 mL/min (ref >60.00)
Glucose, Bld: 97 mg/dL (ref 70–99)
Potassium: 4.5 mEq/L (ref 3.5–5.1)
Sodium: 142 mEq/L (ref 135–145)
Total Bilirubin: 0.4 mg/dL (ref 0.2–1.2)
Total Protein: 6.4 g/dL (ref 6.0–8.3)

## 2020-04-30 LAB — LIPID PANEL
Cholesterol: 234 mg/dL — ABNORMAL HIGH (ref 0–200)
HDL: 38.4 mg/dL — ABNORMAL LOW (ref >39.00)
Total CHOL/HDL Ratio: 6
Triglycerides: 404 mg/dL — ABNORMAL HIGH (ref 0.0–149.0)

## 2020-04-30 LAB — TSH: TSH: 18.02 u[IU]/mL — ABNORMAL HIGH (ref 0.35–4.50)

## 2020-04-30 LAB — LDL CHOLESTEROL, DIRECT: Direct LDL: 135 mg/dL

## 2020-04-30 LAB — VITAMIN B12: Vitamin B-12: 779 pg/mL (ref 211–911)

## 2020-05-26 DIAGNOSIS — M47892 Other spondylosis, cervical region: Secondary | ICD-10-CM | POA: Diagnosis not present

## 2020-05-26 DIAGNOSIS — M542 Cervicalgia: Secondary | ICD-10-CM | POA: Diagnosis not present

## 2020-05-26 DIAGNOSIS — M9901 Segmental and somatic dysfunction of cervical region: Secondary | ICD-10-CM | POA: Diagnosis not present

## 2020-06-04 DIAGNOSIS — M542 Cervicalgia: Secondary | ICD-10-CM | POA: Diagnosis not present

## 2020-06-04 DIAGNOSIS — M9901 Segmental and somatic dysfunction of cervical region: Secondary | ICD-10-CM | POA: Diagnosis not present

## 2020-06-04 DIAGNOSIS — M47892 Other spondylosis, cervical region: Secondary | ICD-10-CM | POA: Diagnosis not present

## 2020-06-11 DIAGNOSIS — M542 Cervicalgia: Secondary | ICD-10-CM | POA: Diagnosis not present

## 2020-06-11 DIAGNOSIS — M47892 Other spondylosis, cervical region: Secondary | ICD-10-CM | POA: Diagnosis not present

## 2020-06-11 DIAGNOSIS — M9901 Segmental and somatic dysfunction of cervical region: Secondary | ICD-10-CM | POA: Diagnosis not present

## 2020-06-15 ENCOUNTER — Ambulatory Visit: Payer: Medicare HMO | Admitting: Family Medicine

## 2020-06-22 ENCOUNTER — Other Ambulatory Visit: Payer: Self-pay

## 2020-06-22 ENCOUNTER — Encounter: Payer: Self-pay | Admitting: Family Medicine

## 2020-06-22 ENCOUNTER — Ambulatory Visit (INDEPENDENT_AMBULATORY_CARE_PROVIDER_SITE_OTHER): Payer: Medicare HMO | Admitting: Family Medicine

## 2020-06-22 VITALS — BP 126/70 | HR 75 | Temp 97.3°F | Ht 62.0 in | Wt 134.8 lb

## 2020-06-22 DIAGNOSIS — I700 Atherosclerosis of aorta: Secondary | ICD-10-CM

## 2020-06-22 DIAGNOSIS — I10 Essential (primary) hypertension: Secondary | ICD-10-CM

## 2020-06-22 DIAGNOSIS — F17200 Nicotine dependence, unspecified, uncomplicated: Secondary | ICD-10-CM

## 2020-06-22 DIAGNOSIS — E7800 Pure hypercholesterolemia, unspecified: Secondary | ICD-10-CM | POA: Diagnosis not present

## 2020-06-22 DIAGNOSIS — L304 Erythema intertrigo: Secondary | ICD-10-CM | POA: Diagnosis not present

## 2020-06-22 DIAGNOSIS — R7989 Other specified abnormal findings of blood chemistry: Secondary | ICD-10-CM | POA: Insufficient documentation

## 2020-06-22 DIAGNOSIS — G3184 Mild cognitive impairment, so stated: Secondary | ICD-10-CM | POA: Diagnosis not present

## 2020-06-22 MED ORDER — NYSTATIN 100000 UNIT/GM EX CREA: 1.0000 "application " | TOPICAL_CREAM | Freq: Two times a day (BID) | CUTANEOUS | 2 refills | Status: DC

## 2020-06-22 MED ORDER — ROSUVASTATIN CALCIUM 10 MG PO TABS: ORAL_TABLET | ORAL | 3 refills | Status: DC

## 2020-06-22 NOTE — Assessment & Plan Note (Signed)
Improved with amlodipine 5 mg daily  Tolerates well  bp in fair control at this time  BP Readings from Last 1 Encounters:  06/22/20 126/70   No changes needed Most recent labs reviewed  Disc lifstyle change with low sodium diet and exercise

## 2020-06-22 NOTE — Assessment & Plan Note (Signed)
In pt who has cognitive slowing Lab Results  Component Value Date   TSH 18.02 (H) 04/29/2020   Repeat this with FT4 today  Suspect we will tx with levothyroxine once we get results

## 2020-06-22 NOTE — Patient Instructions (Addendum)
Get back on generic crestor twice weekly for cholesterol  If any side effects let me know (muscle ache)    Thyroid labs today  We may start a thyroid supplement depending on results   Try to eat a healthy diet  Avoid red meat/ fried foods/ egg yolks/ fatty breakfast meats/ butter, cheese and high fat dairy/ and shellfish

## 2020-06-22 NOTE — Assessment & Plan Note (Signed)
Mycostatin cream helps significantly  Rash is under breasts  Discussed importance of staying cool/drying well  Refilled cream  inst to update if worse again or no improvement

## 2020-06-22 NOTE — Assessment & Plan Note (Signed)
Still cutting down and doing well Enc to eventually quit

## 2020-06-22 NOTE — Assessment & Plan Note (Signed)
Disc goals for lipids and reasons to control them Rev last labs with pt Rev low sat fat diet in detail Agreed to re start rosuvastatin 10 mg twice weekly and update if any side effects (like muscle pain she has with other statins daily in the past)  ASCVD score is 21.5%

## 2020-06-22 NOTE — Progress Notes (Signed)
Subjective:    Patient ID: Tammy Neal, female    DOB: 1950-11-15, 70 y.o.   MRN: [id number]  This visit occurred during the SARS-CoV-2 public health emergency.  Safety protocols were in place, including screening questions prior to the visit, additional usage of staff PPE, and extensive cleaning of exam room while observing appropriate contact time as indicated for disinfecting solutions.    HPI Pt presents for f/u of HTN and other chronic health problems   Wt Readings from Last 3 Encounters:  06/22/20 134 lb 12.8 oz (61.1 kg)  04/29/20 132 lb 7 oz (60.1 kg)  02/28/20 128 lb (58.1 kg)   24.66 kg/m   Last visit bp was not well controlled here or in office, with systolic in 735H  We started her on amlodipine 5 mg daily   bp is stable today  No cp or palpitations or headaches or edema  No side effects to medicines  BP Readings from Last 3 Encounters:  06/22/20 126/70  04/29/20 (!) 162/80  02/28/20 (!) 169/81     Pulse Readings from Last 3 Encounters:  06/22/20 75  04/29/20 65  02/28/20 73   Good/better at home as well   Smoking status - less still / not struggling too much  Worse when bored  Stays active-cleans all day (different houses)    Labs were done   MCI Taking namenda 10 mg bid (intol of aricept)   Lab Results  Component Value Date   CREATININE 0.87 04/29/2020   BUN 13 04/29/2020   NA 142 04/29/2020   K 4.5 04/29/2020   CL 104 04/29/2020   CO2 33 (H) 04/29/2020   Lab Results  Component Value Date   ALT 8 04/29/2020   AST 13 04/29/2020   ALKPHOS 68 04/29/2020   BILITOT 0.4 04/29/2020    Cholesterol  Lab Results  Component Value Date   CHOL 234 (H) 04/29/2020   HDL 38.40 (L) 04/29/2020   LDLDIRECT 135.0 04/29/2020   TRIG (H) 04/29/2020    404.0 Triglyceride is over 400; calculations on Lipids are invalid.   CHOLHDL 6 04/29/2020  pt had stopped her twice weekly crestor before labs  The 10-year ASCVD risk score Mikey Bussing DC Jr., et al.,  2013) is: 21.5%   Values used to calculate the score:     Age: 34 years     Sex: Female     Is Non-Hispanic African American: No     Diabetic: No     Tobacco smoker: Yes     Systolic Blood Pressure: 299 mmHg     Is BP treated: Yes     HDL Cholesterol: 38.4 mg/dL     Total Cholesterol: 234 mg/dL  B12 (oral suppl) Lab Results  Component Value Date   VITAMINB12 779 04/29/2020     Good   TSH was elevated Lab Results  Component Value Date   TSH 18.02 (H) 04/29/2020   this is new  Thyroid problems run in the family   Intertrigo under breasts is improved with mycostatin and needs a refill   Patient Active Problem List   Diagnosis Date Noted  . Elevated TSH 06/22/2020  . Essential hypertension 04/29/2020  . Spondylosis without myelopathy or radiculopathy, cervical region 02/28/2020  . Low vitamin B12 level 03/20/2018  . Anxiety disorder 03/20/2018  . OA (osteoarthritis) of hip 11/16/2016  . Aortic atherosclerosis ([place]) 11/06/2016  . Arthritis of left hip 07/13/2016  . Estrogen deficiency 12/29/2015  . Cough 04/30/2015  .  Mild cognitive impairment 01/02/2015  . Intertrigo 11/02/2011  . Routine general medical examination at a health care facility 10/26/2011  . Gynecological examination 07/05/2010  . DEPRESSION 05/03/2010  . Neck pain 06/10/2008  . Hyperlipidemia 02/26/2007  . TOBACCO ABUSE 02/26/2007  . PSORIASIS 02/26/2007   Past Medical History:  Diagnosis Date  . Allergy    allergic rhinitis  . Arthritis   . COPD (chronic obstructive pulmonary disease) ([place])   . Hyperlipidemia    no meds  . Neck pain, chronic    with fusion  . Osteoporosis   . Psoriasis    Past Surgical History:  Procedure Laterality Date  . ABDOMINAL HYSTERECTOMY     endometriosis 1 ovary left  . APPENDECTOMY    . NECK SURGERY    . SPINE SURGERY  1977   cervical fusion Dr Carloyn Manner x2  . 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   Procedure: LEFT TOTAL HIP ARTHROPLASTY ANTERIOR APPROACH;   Surgeon: Gaynelle Arabian, MD;  Location: [facility];  Service: Orthopedics;  Laterality: Left;   Social History   Tobacco Use  . Smoking status: Current Every Day Smoker    Packs/day: 0.25  . Smokeless tobacco: Never Used  Substance Use Topics  . Alcohol use: No    Alcohol/week: 0.0 standard drinks  . Drug use: No   Family History  Problem Relation Age of Onset  . Hypertension Mother   . Diabetes Mother   . Stroke Mother 8  . Asthma Father   . COPD Father   . Colon cancer Neg Hx   . Esophageal cancer Neg Hx   . Ulcerative colitis Neg Hx   . Stomach cancer Neg Hx    Allergies  Allergen Reactions  . Voltaren [Diclofenac Sodium] Anaphylaxis  . Diclofenac Sodium Hives and Itching  . Aricept [Donepezil Hcl] Itching    Itchy, jittery (no hives)  . Aspirin Other (See Comments)    REACTION: GI upset  . Atorvastatin     REACTION: pain  . Simvastatin     REACTION: muscle pain   Current Outpatient Medications on File Prior to Visit  Medication Sig Dispense Refill  . acetaminophen (TYLENOL) 650 MG CR tablet Take 650 mg by mouth 2 (two) times daily as needed for pain.     Marland Kitchen amLODipine (NORVASC) 5 MG tablet Take 1 tablet (5 mg total) by mouth daily. 90 tablet 3  . busPIRone (BUSPAR) 15 MG tablet Take 1/4 tablet twice daily 45 tablet 3  . memantine (NAMENDA) 10 MG tablet Take 1 tablet (10 mg total) by mouth 2 (two) times daily. 180 tablet 3  . vitamin B-12 (CYANOCOBALAMIN) 1000 MCG tablet Take 1,000 mcg by mouth daily.     No current facility-administered medications on file prior to visit.    Review of Systems  Constitutional: Negative for activity change, appetite change, fatigue, fever and unexpected weight change.  HENT: Negative for congestion, ear pain, rhinorrhea, sinus pressure and sore throat.   Eyes: Negative for pain, redness and visual disturbance.  Respiratory: Negative for cough, shortness of breath and wheezing.   Cardiovascular: Negative for chest pain and  palpitations.  Gastrointestinal: Negative for abdominal pain, blood in stool, constipation and diarrhea.  Endocrine: Negative for polydipsia and polyuria.  Genitourinary: Negative for dysuria, frequency and urgency.  Musculoskeletal: Negative for arthralgias, back pain and myalgias.  Skin: Negative for pallor and rash.  Allergic/Immunologic: Negative for environmental allergies.  Neurological: Negative for dizziness, syncope and headaches.  Hematological: Negative for adenopathy.  Does not bruise/bleed easily.  Psychiatric/Behavioral: Positive for decreased concentration. Negative for agitation, confusion and dysphoric mood. The patient is not nervous/anxious.        Objective:   Physical Exam Constitutional:      General: She is not in acute distress.    Appearance: Normal appearance. She is well-developed and normal weight. She is not ill-appearing or diaphoretic.  HENT:     Head: Normocephalic and atraumatic.  Eyes:     Conjunctiva/sclera: Conjunctivae normal.     Pupils: Pupils are equal, round, and reactive to light.  Neck:     Thyroid: No thyromegaly.     Vascular: No carotid bruit or JVD.     Comments: No thyroid enlargement  Cardiovascular:     Rate and Rhythm: Normal rate and regular rhythm.     Heart sounds: Normal heart sounds. No gallop.   Pulmonary:     Effort: Pulmonary effort is normal. No respiratory distress.     Breath sounds: Normal breath sounds. No wheezing or rales.  Abdominal:     General: Bowel sounds are normal. There is no distension or abdominal bruit.     Palpations: Abdomen is soft. There is no mass.     Tenderness: There is no abdominal tenderness.  Musculoskeletal:     Cervical back: Normal range of motion and neck supple.  Lymphadenopathy:     Cervical: No cervical adenopathy.  Skin:    General: Skin is warm and dry.     Findings: Erythema present. No rash.     Comments: Faint erythema under breasts No scale or satellite lesions    Neurological:     Mental Status: She is alert.     Deep Tendon Reflexes: Reflexes are normal and symmetric. Reflexes normal.     Comments: No tremor  Psychiatric:        Mood and Affect: Mood normal.        Cognition and Memory: She exhibits impaired recent memory.           Assessment & Plan:   Problem List Items Addressed This Visit      Cardiovascular and Mediastinum   Aortic atherosclerosis ([place])    In smoker with HTN  Controlling risk factors Planning to try low dose intermittent crestor again      Relevant Medications   rosuvastatin (CRESTOR) 10 MG tablet   Essential hypertension - Primary    Improved with amlodipine 5 mg daily  Tolerates well  bp in fair control at this time  BP Readings from Last 1 Encounters:  06/22/20 126/70   No changes needed Most recent labs reviewed  Disc lifstyle change with low sodium diet and exercise        Relevant Medications   rosuvastatin (CRESTOR) 10 MG tablet     Musculoskeletal and Integument   Intertrigo    Mycostatin cream helps significantly  Rash is under breasts  Discussed importance of staying cool/drying well  Refilled cream  inst to update if worse again or no improvement        Other   Hyperlipidemia    Disc goals for lipids and reasons to control them Rev last labs with pt Rev low sat fat diet in detail Agreed to re start rosuvastatin 10 mg twice weekly and update if any side effects (like muscle pain she has with other statins daily in the past)  ASCVD score is 21.5%       Relevant Medications   rosuvastatin (CRESTOR) 10 MG  tablet   TOBACCO ABUSE    Still cutting down and doing well Enc to eventually quit        Mild cognitive impairment    Thyroid labs today  If hypothyroid will px levothyroxine This may help cognitive status      Elevated TSH    In pt who has cognitive slowing Lab Results  Component Value Date   TSH 18.02 (H) 04/29/2020   Repeat this with FT4 today  Suspect we  will tx with levothyroxine once we get results       Relevant Orders   TSH   T4, free

## 2020-06-22 NOTE — Assessment & Plan Note (Signed)
Thyroid labs today  If hypothyroid will px levothyroxine This may help cognitive status

## 2020-06-22 NOTE — Assessment & Plan Note (Signed)
In smoker with HTN  Controlling risk factors Planning to try low dose intermittent crestor again

## 2020-06-23 ENCOUNTER — Encounter: Payer: Self-pay | Admitting: Family Medicine

## 2020-06-23 DIAGNOSIS — E039 Hypothyroidism, unspecified: Secondary | ICD-10-CM | POA: Insufficient documentation

## 2020-06-23 LAB — T4, FREE: Free T4: 0.63 ng/dL (ref 0.60–1.60)

## 2020-06-23 LAB — TSH: TSH: 10.28 u[IU]/mL — ABNORMAL HIGH (ref 0.35–4.50)

## 2020-06-24 ENCOUNTER — Telehealth: Payer: Self-pay | Admitting: *Deleted

## 2020-06-24 MED ORDER — LEVOTHYROXINE SODIUM 25 MCG PO TABS
25.0000 ug | ORAL_TABLET | Freq: Every day | ORAL | 3 refills | Status: DC
Start: 2020-06-24 — End: 2020-08-04

## 2020-06-24 NOTE — Telephone Encounter (Signed)
-----   Message from Abner Greenspan, MD sent at 06/23/2020  8:37 PM EDT ----- It looks like early/mild hypothyroidism Please send in levothyroxine 25 mcg 1 po qd in am at least 1 h before food or supplements #30 3 ref Re check tsh in about 4-6 wk

## 2020-06-24 NOTE — Telephone Encounter (Signed)
Rx sent to pharmacy and family aware

## 2020-06-26 ENCOUNTER — Other Ambulatory Visit: Payer: Self-pay | Admitting: Family Medicine

## 2020-07-02 ENCOUNTER — Other Ambulatory Visit: Payer: Self-pay | Admitting: Family Medicine

## 2020-07-02 DIAGNOSIS — Z1231 Encounter for screening mammogram for malignant neoplasm of breast: Secondary | ICD-10-CM

## 2020-07-14 ENCOUNTER — Telehealth: Payer: Self-pay | Admitting: *Deleted

## 2020-07-14 NOTE — Telephone Encounter (Signed)
Daughter Roselyn Reef (on [place]) left message at Triage. Pt's other daughter tested positive for covid and it's on of the daughter's who takes care of pt. Pt's other daughter who is calling in Roselyn Reef was around pt's other daughter so she took an at home covid test and her's was also positive. Both of pt's daughter's who take care of her both have covid and they are worried about pt's having covid. Pt hasn't been tested but daughter said that pt has a cough but she normally has a chronic cough due to smoking. Pt also has fatigue and diarrhea. Pt has no trouble breathing or SOB, no fever and no other sxs. Pt is still acting like herself. I advise daughter we could schedule a virtual visit with a provider to discuss sxs and treatment options daughter declined to schedule an appt, she said they trust Dr. Glori Bickers and Dr. Glori Bickers know's pt and her medical history and they want to know what Dr. Glori Bickers recommends. ER precautions given   Will route call to PCP for advisement

## 2020-07-14 NOTE — Telephone Encounter (Signed)
She is immunized (unsure if she has had a booster)  If they can test her at home, do so please and let us know.   I understand that if cough is chronic it may be difficult to tell.  Watch closely for fever/ sob/inc cough or other symptoms If covid test at home is positive or if other symptoms begin then schedule virtual visit (if severe-ER)   Thanks for giving me the heads up !

## 2020-07-15 NOTE — Telephone Encounter (Signed)
Left VM letting pt's daughter know Dr. Marliss Coots comments and instructions and to call us back and schedule a virtual visit if she has any concerns

## 2020-07-16 ENCOUNTER — Telehealth: Payer: Self-pay

## 2020-07-16 NOTE — Telephone Encounter (Signed)
For liquid I would recommend delsym as directed otc Tylenol-would have to see which formula they have - adults get 325 mg per pill (1-2 pills per dose)  I think most of the liquid formulations have 160 mg per teaspoon (5 cc)   2 teaspoons = 320 mg  (close to one adult pill)  So 2-4 teaspoons per dose   Check bottle to see if this is the form they have  Keep me posted re: how she is feeling

## 2020-07-16 NOTE — Telephone Encounter (Signed)
Ronny Bacon (DPR signed) left v/m that pt having difficulty swallowing pills and wants to know if an equivalent liquid cough med instead of tessalon could be sent to [facility]; also wants to know what is appropriate dose of childrens liquid Tylenol for pt due to low grade fever. Natasha request cb to her or Roselyn Reef with response.

## 2020-07-16 NOTE — Telephone Encounter (Signed)
Natasha notified of Dr. Marliss Coots comments and instructions and verbalized understanding

## 2020-07-30 ENCOUNTER — Telehealth: Payer: Self-pay | Admitting: Family Medicine

## 2020-07-30 DIAGNOSIS — E039 Hypothyroidism, unspecified: Secondary | ICD-10-CM

## 2020-07-30 DIAGNOSIS — I10 Essential (primary) hypertension: Secondary | ICD-10-CM

## 2020-07-30 NOTE — Telephone Encounter (Signed)
-----   Message from Cloyd Stagers, RT sent at 07/13/2020  2:29 PM EDT ----- Regarding: Lab Orders for Friday 6.10.2022 Please place lab orders for Friday 6.10.2022, appt notes state "thyroid f/u" Thank you, Dyke Maes RT(R)

## 2020-07-31 ENCOUNTER — Other Ambulatory Visit (INDEPENDENT_AMBULATORY_CARE_PROVIDER_SITE_OTHER): Payer: Medicare HMO

## 2020-07-31 ENCOUNTER — Other Ambulatory Visit: Payer: Self-pay

## 2020-07-31 DIAGNOSIS — E039 Hypothyroidism, unspecified: Secondary | ICD-10-CM

## 2020-07-31 LAB — TSH: TSH: 4.56 u[IU]/mL — ABNORMAL HIGH (ref 0.35–4.50)

## 2020-08-04 ENCOUNTER — Telehealth: Payer: Self-pay | Admitting: *Deleted

## 2020-08-04 MED ORDER — LEVOTHYROXINE SODIUM 50 MCG PO TABS: 50.0000 ug | ORAL_TABLET | Freq: Every day | ORAL | 3 refills | Status: DC

## 2020-08-04 NOTE — Telephone Encounter (Signed)
-----   Message from Abner Greenspan, MD sent at 08/02/2020 10:36 AM EDT ----- Improved tsh (close to ref range goal)  Please send in levothyroxine 50 mcg daily (she is on 25) 1 po daily #30 3 ref  I suspect this will get to goal and may help her feel better  Re check tsh in 6 wk

## 2020-08-04 NOTE — Telephone Encounter (Signed)
Natasha notified of lab results and Dr.  Marliss Coots comments. New Rx sent to pharmacy and f/u lab appt scheduled

## 2020-08-28 ENCOUNTER — Telehealth: Payer: Self-pay

## 2020-08-28 ENCOUNTER — Ambulatory Visit
Admission: RE | Admit: 2020-08-28 | Discharge: 2020-08-28 | Disposition: A | Discharge status: Home / Self Care | Payer: Medicare HMO | Source: Ambulatory Visit | Attending: Family Medicine | Admitting: Family Medicine

## 2020-08-28 ENCOUNTER — Other Ambulatory Visit: Payer: Self-pay

## 2020-08-28 DIAGNOSIS — Z1231 Encounter for screening mammogram for malignant neoplasm of breast: Secondary | ICD-10-CM | POA: Diagnosis not present

## 2020-08-28 NOTE — Telephone Encounter (Signed)
Pt's daughter called the office asking what Dr Glori Bickers can do to make the [place] allow her to go back in the room with the pt as she has her mammogram. She brought her 71 yr old grandson with her and they will not let her go back with the pt even though she has Dementia. I advised her I did not think there was anything Dr Glori Bickers could do. Sometimes we have watched a child while the provider is with the patient. Not sure if they would do that. I did go ask Dr Glori Bickers what she thought and she pretty much had the same thought. Advised pt's daughter there was not anything we can do.

## 2020-08-31 ENCOUNTER — Telehealth: Payer: Self-pay | Admitting: *Deleted

## 2020-08-31 NOTE — Telephone Encounter (Signed)
Patient's daughter called stating that her mom was folding a blanket Friday and she said that her arm hurt. Patient's daughter stated that she acted like she had pulled a muscle. Tammy Neal stated that the area looks like it is beginning to bruise. Patient's daughter stated that she just realized that her mom has been massaging and  rubbing the arm a lot and she bruises easily. Patient's daughter denies that the arm is red and it is not warm to the touch. Patient's daughter stated that she will continue to monitor her mom's arm and will call the office back if she feels that someone needs to examine her mom's arm. Tammy Neal was given ER precautions and she verbalized understanding.

## 2020-08-31 NOTE — Telephone Encounter (Signed)
Aware, I am out of the office this week  Will adv f/u if symptoms do not improve

## 2020-09-15 ENCOUNTER — Telehealth: Payer: Self-pay | Admitting: Family Medicine

## 2020-09-15 DIAGNOSIS — E538 Deficiency of other specified B group vitamins: Secondary | ICD-10-CM

## 2020-09-15 DIAGNOSIS — E039 Hypothyroidism, unspecified: Secondary | ICD-10-CM

## 2020-09-15 NOTE — Telephone Encounter (Signed)
-----   Message from Ellamae Sia sent at 08/31/2020 10:05 AM EDT ----- Regarding: Lab orders for Wednesday, 7.27.22 Lab order for Tsh, b12 is ordered, do you want that also?

## 2020-09-16 ENCOUNTER — Other Ambulatory Visit: Payer: Medicare HMO

## 2020-09-21 ENCOUNTER — Telehealth: Payer: Self-pay

## 2020-09-21 MED ORDER — MEMANTINE HCL 10 MG PO TABS: 10.0000 mg | ORAL_TABLET | Freq: Two times a day (BID) | ORAL | 1 refills | Status: DC

## 2020-09-21 NOTE — Telephone Encounter (Signed)
VM left requesting refill of memantine be sent to [facility].

## 2020-10-29 DIAGNOSIS — M9901 Segmental and somatic dysfunction of cervical region: Secondary | ICD-10-CM | POA: Diagnosis not present

## 2020-10-29 DIAGNOSIS — M542 Cervicalgia: Secondary | ICD-10-CM | POA: Diagnosis not present

## 2020-10-29 DIAGNOSIS — M47892 Other spondylosis, cervical region: Secondary | ICD-10-CM | POA: Diagnosis not present

## 2020-12-15 ENCOUNTER — Other Ambulatory Visit: Payer: Self-pay | Admitting: Family Medicine

## 2020-12-29 ENCOUNTER — Ambulatory Visit (INDEPENDENT_AMBULATORY_CARE_PROVIDER_SITE_OTHER)
Admission: RE | Admit: 2020-12-29 | Discharge: 2020-12-29 | Disposition: A | Discharge status: Home / Self Care | Payer: Medicare HMO | Source: Ambulatory Visit | Attending: Family Medicine | Admitting: Family Medicine

## 2020-12-29 ENCOUNTER — Ambulatory Visit (INDEPENDENT_AMBULATORY_CARE_PROVIDER_SITE_OTHER): Payer: Medicare HMO | Admitting: Family Medicine

## 2020-12-29 ENCOUNTER — Other Ambulatory Visit: Payer: Self-pay

## 2020-12-29 ENCOUNTER — Encounter: Payer: Self-pay | Admitting: Family Medicine

## 2020-12-29 VITALS — BP 132/75 | HR 67 | Temp 97.9°F | Ht 62.0 in | Wt 138.0 lb

## 2020-12-29 DIAGNOSIS — M25551 Pain in right hip: Secondary | ICD-10-CM | POA: Diagnosis not present

## 2020-12-29 DIAGNOSIS — F17200 Nicotine dependence, unspecified, uncomplicated: Secondary | ICD-10-CM

## 2020-12-29 DIAGNOSIS — M1611 Unilateral primary osteoarthritis, right hip: Secondary | ICD-10-CM | POA: Diagnosis not present

## 2020-12-29 DIAGNOSIS — J449 Chronic obstructive pulmonary disease, unspecified: Secondary | ICD-10-CM | POA: Diagnosis not present

## 2020-12-29 DIAGNOSIS — Z96642 Presence of left artificial hip joint: Secondary | ICD-10-CM | POA: Diagnosis not present

## 2020-12-29 NOTE — Assessment & Plan Note (Signed)
Pt occ has a cigarette  Encouraged strongly to quit altogether

## 2020-12-29 NOTE — Patient Instructions (Addendum)
Continue heat if it helps   Use your cane  Xray today-we will call with results and plan   Flu shot today

## 2020-12-29 NOTE — Assessment & Plan Note (Signed)
Strongly suspect progressive OA in light of history (L hip repl) and also findings on old xray  Reviewed old notes and xray Xray ordered-pend rad rev  Will likely need orthop referral (Dr Maureen Ralphs treated other hip) Enc her to use ice/heat Asper cream is ok  Tylenol prn (up to every 8 hours) Use cane on R side

## 2020-12-29 NOTE — Progress Notes (Signed)
Subjective:    Patient ID: Tammy Neal, female    DOB: [birth year], 70 y.o.   MRN: [id number]  This visit occurred during the SARS-CoV-2 public health emergency.  Safety protocols were in place, including screening questions prior to the visit, additional usage of staff PPE, and extensive cleaning of exam room while observing appropriate contact time as indicated for disinfecting solutions.   HPI Pt presents with c/o R hip pain   Wt Readings from Last 3 Encounters:  12/29/20 138 lb (62.6 kg)  06/22/20 134 lb 12.8 oz (61.1 kg)  04/29/20 132 lb 7 oz (60.1 kg)   25.24 kg/m  Pain right hip area  Rubs in her groin  ? If pain in outside   Using some heat  It helps just a little (uses bean bag at night) Asper cream during the day  Allergic to voltaren    She has past h/o OA of L hip with surgery  From xr in 2016:  IMPRESSION: 1. No fracture or acute finding.  No bone lesion. 2. Arthropathic changes of the left hip. There are more subtle arthropathic changes of the right hip.  She had some early arthritis changes in the R hip also   Smoking status-daily  2-3 cig per day   BP Readings from Last 3 Encounters:  12/29/20 132/75  06/22/20 126/70  04/29/20 (!) 162/80    Has taken tylenol arthritis  Takes just one pill daily   Using cane on R side    Patient Active Problem List   Diagnosis Date Noted   Right hip pain 12/29/2020   Hypothyroid 06/23/2020   Elevated TSH 06/22/2020   Essential hypertension 04/29/2020   Spondylosis without myelopathy or radiculopathy, cervical region 02/28/2020   Low vitamin B12 level 03/20/2018   Anxiety disorder 03/20/2018   Aortic atherosclerosis ([place]) 11/06/2016   Arthritis of left hip 07/13/2016   Estrogen deficiency 12/29/2015   Cough 04/30/2015   Mild cognitive impairment 01/02/2015   Intertrigo 11/02/2011   Routine general medical examination at a health care facility 10/26/2011   Gynecological examination 07/05/2010    DEPRESSION 05/03/2010   Neck pain 06/10/2008   Hyperlipidemia 02/26/2007   TOBACCO ABUSE 02/26/2007   PSORIASIS 02/26/2007   Past Medical History:  Diagnosis Date   Allergy    allergic rhinitis   Arthritis    COPD (chronic obstructive pulmonary disease) ([place])    Hyperlipidemia    no meds   Neck pain, chronic    with fusion   Osteoporosis    Psoriasis    Past Surgical History:  Procedure Laterality Date   ABDOMINAL HYSTERECTOMY     endometriosis 1 ovary left   APPENDECTOMY     NECK SURGERY     SPINE SURGERY  1977   cervical fusion Dr Carloyn Manner x2   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   Procedure: LEFT TOTAL HIP ARTHROPLASTY ANTERIOR APPROACH;  Surgeon: Gaynelle Arabian, MD;  Location: [facility];  Service: Orthopedics;  Laterality: Left;   Social History   Tobacco Use   Smoking status: Every Day    Packs/day: 0.25    Types: Cigarettes   Smokeless tobacco: Never  Substance Use Topics   Alcohol use: No    Alcohol/week: 0.0 standard drinks   Drug use: No   Family History  Problem Relation Age of Onset   Hypertension Mother    Diabetes Mother    Stroke Mother 76   Asthma Father    COPD Father  Colon cancer Neg Hx    Esophageal cancer Neg Hx    Ulcerative colitis Neg Hx    Stomach cancer Neg Hx    Allergies  Allergen Reactions   Voltaren [Diclofenac Sodium] Anaphylaxis   Diclofenac Sodium Hives and Itching   Aricept [Donepezil Hcl] Itching    Itchy, jittery (no hives)   Aspirin Other (See Comments)    REACTION: GI upset   Atorvastatin     REACTION: pain   Simvastatin     REACTION: muscle pain   Current Outpatient Medications on File Prior to Visit  Medication Sig Dispense Refill   acetaminophen (TYLENOL) 650 MG CR tablet Take 650 mg by mouth 2 (two) times daily as needed for pain.      amLODipine (NORVASC) 5 MG tablet Take 1 tablet (5 mg total) by mouth daily. 90 tablet 3   busPIRone (BUSPAR) 15 MG tablet TAKE 1/4 TABLET BY MOUTH TWICE A DAY 45 tablet 1    levothyroxine (SYNTHROID) 50 MCG tablet Take 1 tablet (50 mcg total) by mouth daily before breakfast. 30 tablet 3   memantine (NAMENDA) 10 MG tablet Take 1 tablet (10 mg total) by mouth 2 (two) times daily. 180 tablet 1   nystatin cream (MYCOSTATIN) Apply 1 application topically 2 (two) times daily. To affected area 30 g 2   rosuvastatin (CRESTOR) 10 MG tablet Take one pill by mouth twice weekly 24 tablet 3   vitamin B-12 (CYANOCOBALAMIN) 1000 MCG tablet Take 1,000 mcg by mouth daily.     No current facility-administered medications on file prior to visit.    Review of Systems  Constitutional:  Negative for activity change, appetite change, fatigue, fever and unexpected weight change.  HENT:  Negative for congestion, ear pain, rhinorrhea, sinus pressure and sore throat.   Eyes:  Negative for pain, redness and visual disturbance.  Respiratory:  Negative for cough, shortness of breath and wheezing.   Cardiovascular:  Negative for chest pain and palpitations.  Gastrointestinal:  Negative for abdominal pain, blood in stool, constipation and diarrhea.  Endocrine: Negative for polydipsia and polyuria.  Genitourinary:  Negative for dysuria, frequency and urgency.  Musculoskeletal:  Negative for arthralgias, back pain and myalgias.       Right hip/groin pain   Skin:  Negative for pallor and rash.  Allergic/Immunologic: Negative for environmental allergies.  Neurological:  Negative for dizziness, syncope and headaches.  Hematological:  Negative for adenopathy. Does not bruise/bleed easily.  Psychiatric/Behavioral:  Positive for decreased concentration. Negative for dysphoric mood. The patient is not nervous/anxious.       Objective:   Physical Exam Constitutional:      General: She is not in acute distress.    Appearance: Normal appearance. She is normal weight. She is not ill-appearing.  Eyes:     General:        Right eye: No discharge.        Left eye: No discharge.      Conjunctiva/sclera: Conjunctivae normal.     Pupils: Pupils are equal, round, and reactive to light.  Cardiovascular:     Rate and Rhythm: Regular rhythm.     Heart sounds: Normal heart sounds.  Pulmonary:     Effort: Pulmonary effort is normal. No respiratory distress.     Breath sounds: Normal breath sounds. No wheezing.  Musculoskeletal:     Cervical back: Neck supple.     Right hip: Tenderness present. No deformity, bony tenderness or crepitus. Decreased range of motion. Normal strength.  Left hip: Normal.     Comments: R hip - pain on internal and external rotation over 30 degrees Mild tenderness in groin No trochanteric tenderness No rash or skin change  LEs are perfused   Lymphadenopathy:     Cervical: No cervical adenopathy.  Skin:    General: Skin is warm and dry.     Findings: No erythema.  Neurological:     Mental Status: She is alert.     Sensory: No sensory deficit.     Motor: No weakness.  Psychiatric:        Mood and Affect: Mood normal.          Assessment & Plan:   Problem List Items Addressed This Visit       Other   TOBACCO ABUSE - Primary    Pt occ has a cigarette  Encouraged strongly to quit altogether       Right hip pain    Strongly suspect progressive OA in light of history (L hip repl) and also findings on old xray  Reviewed old notes and xray Xray ordered-pend rad rev  Will likely need orthop referral (Dr Maureen Ralphs treated other hip) Enc her to use ice/heat Asper cream is ok  Tylenol prn (up to every 8 hours) Use cane on R side        Relevant Orders   DG Hip Unilat W OR W/O Pelvis 2-3 Views Right

## 2021-01-15 ENCOUNTER — Encounter: Payer: Self-pay | Admitting: Family Medicine

## 2021-01-15 DIAGNOSIS — M25551 Pain in right hip: Secondary | ICD-10-CM

## 2021-03-01 DIAGNOSIS — M25551 Pain in right hip: Secondary | ICD-10-CM | POA: Diagnosis not present

## 2021-03-10 DIAGNOSIS — M25551 Pain in right hip: Secondary | ICD-10-CM | POA: Diagnosis not present

## 2021-03-25 ENCOUNTER — Ambulatory Visit: Payer: Self-pay

## 2021-03-25 ENCOUNTER — Telehealth: Payer: Self-pay

## 2021-03-25 NOTE — Telephone Encounter (Signed)
Morey Hummingbird RN with access nurse calling and she is talking with Tammy Neal pts daughter (DPR signed) Tammy Neal and pt are on their way back from [place] and starting on 03/24/21 pt developed prod cough with clear phlegm. Pt had SOB upon exertion on 03/24/21 but no SOB today. Pt has not had fever and Tammy Neal wants pt seen at [facility] today but will not be back in town until around 5:45 - 6:00 pm. Pt has COPD, problems with BP & thyroid.today pt still has prod cough with clear phlegm. No CP but is not sure what oxygen level is. No available appts at [facility] after pt gets back in town today. I scheduled appt with [facility] in [place] 03/25/21 at 7 PM. Tammy Neal is going to talk with other family members and if  there is any change in appt Tammy Neal will call and cancel the UC appt otherwise pt will be seen at [facility] at 7 PM. UC & ED precautions given and Tammy Neal voiced understanding. If pt worsens prior to returning home pt will be taken to closest ED while traveling.Sending note to Dr Glori Bickers who is out of office this afternoon and [place] CMA and Romilda Garret NP who is in office.  Below is the access nurse note that just came in.  [facility] - Client TELEPHONE ADVICE RECORD AccessNurse Patient Name: Tammy Neal Gender: Female DOB: 07/19/1950 Age: 71 Y 34 M 2 D Return Phone Number: [phone] (Primary) Address: City/ State/ Zip: [place]  [address] Client [facility] - Client Client Site [place] Provider Glori Bickers, Roque Lias - MD Contact Type Call Who Is Calling Patient / Member / Family / Caregiver Call Type Triage / Clinical Caller Name Zenovia Jordan Relationship To Patient Daughter Return Phone Number [phone] (Primary) Chief Complaint Medication Question (non symptomatic) Reason for Call Medication Question / Request Initial Comment Caller states she is calling from the office with a daughter  asking medication questions for her mom. Translation No Nurse Assessment Nurse: Rolin Barry, RN, Levada Dy Date/Time Eilene Ghazi Time): 03/25/2021 1:31:52 PM Confirm and document reason for call. If symptomatic, describe symptoms. ---Caller states she is calling from the office with a daughter asking medication questions for her mom. Daughter advised that she has a cough, states that sx started yesterday morning. No temp. Does the patient have any new or worsening symptoms? ---Yes Will a triage be completed? ---Yes Related visit to physician within the last 2 weeks? ---No Does the PT have any chronic conditions? (i.e. diabetes, asthma, this includes High risk factors for pregnancy, etc.) ---Yes List chronic conditions. ---COPD HTN thyroid Namenda Is this a behavioral health or substance abuse call? ---No Guidelines Guideline Title Affirmed Question Affirmed Notes Nurse Date/Time (Eastern Time) Cough - Acute Productive [1] MILD difficulty breathing (e.g., minimal/no SOB at rest, SOB with walking, pulse <100) AND [2] still present when not coughing Deaton, RN, Levada Dy 03/25/2021 1:34:28 PM PLEASE NOTE: All timestamps contained within this report are represented as [place] Standard Time. CONFIDENTIALTY NOTICE: This fax transmission is intended only for the addressee. It contains information that is legally privileged, confidential or otherwise protected from use or disclosure. If you are not the intended recipient, you are strictly prohibited from reviewing, disclosing, copying using or disseminating any of this information or taking any action in reliance on or regarding this information. If you have received this fax in error, please notify us immediately by telephone so  that we can arrange for its return to [place]. Phone: [phone], Toll-Free: [phone], Fax: [phone] Page: 2 of 2 Call Id: [id number] [place]. Time Eilene Ghazi Time) Disposition Final User 03/25/2021 1:43:52 PM See HCP within 4  Hours (or PCP triage) Yes Deaton, RN, Cindee Lame Disagree/Comply Disagree Caller Understands Yes PreDisposition Did not know what to do Care Advice Given Per Guideline SEE HCP (OR PCP TRIAGE) WITHIN 4 HOURS: * IF OFFICE WILL BE CLOSED AND NO PCP (PRIMARY CARE PROVIDER) SECOND-LEVEL TRIAGE: You need to be seen within the next 3 or 4 hours. A nearby Urgent Care Center [facility]) is often a good source of care. Another choice is to go to the ED. Go sooner if you become worse. * UCC: Some UCCs can manage patients who are stable and have less serious symptoms (e.g., minor illnesses and injuries). The triager must know the [facility] capabilities before sending a patient there. If unsure, call ahead. CALL BACK IF: * You become worse CARE ADVICE given per Cough - Acute Productive (Adult) guideline. Comments User: Saverio Danker, RN Date/Time Eilene Ghazi Time): 03/25/2021 1:46:52 PM Caller wanted to be seen in the office, spoke with Buena at t the backline. Minette Manders, nurse advised of the situation, she was scheduled for a UC appt in [place], [facility] at [place]

## 2021-03-25 NOTE — Telephone Encounter (Signed)
Agreed with plan. If patient does not go to UC tonight we can offer an appointment in office on friday

## 2021-04-09 DIAGNOSIS — M1611 Unilateral primary osteoarthritis, right hip: Secondary | ICD-10-CM | POA: Diagnosis not present

## 2021-04-13 ENCOUNTER — Ambulatory Visit (INDEPENDENT_AMBULATORY_CARE_PROVIDER_SITE_OTHER): Payer: Medicare HMO | Admitting: Family Medicine

## 2021-04-13 ENCOUNTER — Other Ambulatory Visit: Payer: Self-pay | Admitting: Family Medicine

## 2021-04-13 ENCOUNTER — Other Ambulatory Visit: Payer: Self-pay

## 2021-04-13 ENCOUNTER — Encounter: Payer: Self-pay | Admitting: Family Medicine

## 2021-04-13 VITALS — BP 128/60 | HR 63 | Temp 98.6°F | Ht 62.0 in | Wt 135.5 lb

## 2021-04-13 DIAGNOSIS — L720 Epidermal cyst: Secondary | ICD-10-CM | POA: Diagnosis not present

## 2021-04-13 DIAGNOSIS — R053 Chronic cough: Secondary | ICD-10-CM | POA: Diagnosis not present

## 2021-04-13 DIAGNOSIS — F17200 Nicotine dependence, unspecified, uncomplicated: Secondary | ICD-10-CM | POA: Diagnosis not present

## 2021-04-13 NOTE — Patient Instructions (Signed)
I placed a dermatology referral If you don't hear in 2 weeks please call us   Continue to avoid smoking  Lungs sound ok  Get rest , you are still getting over the cold  If worse cough or any shortness of breath   The tessalon is ok  DM cough medicine is fine

## 2021-04-13 NOTE — Assessment & Plan Note (Signed)
With uri-now improving  Reassuring exam  Has almost completely quit smoking/commended Uses DM otc  Can add tessalon -has as well Disc sympt care  ER precautions disc F/u if no further improvement

## 2021-04-13 NOTE — Assessment & Plan Note (Signed)
Resembles epidermal cyst vs seb hyperplasia  Desires removal  Unable to express material from it  Ref done to dermatology to eval and tx  inst to alert [place] if any s/s of infection

## 2021-04-13 NOTE — Assessment & Plan Note (Signed)
Pt has almost completely quit smoking  Commended! Enc to keep working on it

## 2021-04-13 NOTE — Progress Notes (Signed)
Subjective:    Patient ID: Tammy Neal, female    DOB: 1950-09-21, 71 y.o.   MRN: [id number]  This visit occurred during the SARS-CoV-2 public health emergency.  Safety protocols were in place, including screening questions prior to the visit, additional usage of staff PPE, and extensive cleaning of exam room while observing appropriate contact time as indicated for disinfecting solutions.   HPI Pt presents for cough, fatigue and dermatology concern  Wt Readings from Last 3 Encounters:  04/13/21 135 lb 8 oz (61.5 kg)  12/29/20 138 lb (62.6 kg)  06/22/20 134 lb 12.8 oz (61.1 kg)   24.78 kg/m  Acute on chronic cough  Phlegm -clear  Started with a cold and cough was worse than baseline H/o smoking and copd  Better than it was when she called   No wheezing  Not more sob  Is more tired during the day/dozes a bit   Pain from her hip  Is going to do surgery - will put her on the schedule soon   Has a spot on face wants removed  Does not have a dermatologist   Patient Active Problem List   Diagnosis Date Noted   Epidermal cyst of face 04/13/2021   Right hip pain 12/29/2020   Hypothyroid 06/23/2020   Elevated TSH 06/22/2020   Essential hypertension 04/29/2020   Spondylosis without myelopathy or radiculopathy, cervical region 02/28/2020   Low vitamin B12 level 03/20/2018   Anxiety disorder 03/20/2018   Aortic atherosclerosis ([place]) 11/06/2016   Arthritis of left hip 07/13/2016   Estrogen deficiency 12/29/2015   Cough 04/30/2015   Mild cognitive impairment 01/02/2015   Intertrigo 11/02/2011   Routine general medical examination at a health care facility 10/26/2011   Gynecological examination 07/05/2010   DEPRESSION 05/03/2010   Neck pain 06/10/2008   Hyperlipidemia 02/26/2007   TOBACCO ABUSE 02/26/2007   PSORIASIS 02/26/2007   Past Medical History:  Diagnosis Date   Allergy    allergic rhinitis   Arthritis    COPD (chronic obstructive pulmonary disease) ([place])     Hyperlipidemia    no meds   Neck pain, chronic    with fusion   Osteoporosis    Psoriasis    Past Surgical History:  Procedure Laterality Date   ABDOMINAL HYSTERECTOMY     endometriosis 1 ovary left   APPENDECTOMY     NECK SURGERY     SPINE SURGERY  1977   cervical fusion Dr Carloyn Manner x2   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   Procedure: LEFT TOTAL HIP ARTHROPLASTY ANTERIOR APPROACH;  Surgeon: Gaynelle Arabian, MD;  Location: [facility];  Service: Orthopedics;  Laterality: Left;   Social History   Tobacco Use   Smoking status: Every Day    Packs/day: 0.25    Types: Cigarettes   Smokeless tobacco: Never  Substance Use Topics   Alcohol use: No    Alcohol/week: 0.0 standard drinks   Drug use: No   Family History  Problem Relation Age of Onset   Hypertension Mother    Diabetes Mother    Stroke Mother 63   Asthma Father    COPD Father    Colon cancer Neg Hx    Esophageal cancer Neg Hx    Ulcerative colitis Neg Hx    Stomach cancer Neg Hx    Allergies  Allergen Reactions   Voltaren [Diclofenac Sodium] Anaphylaxis   Diclofenac Sodium Hives and Itching   Aricept [Donepezil Hcl] Itching    Itchy, jittery (no  hives)   Aspirin Other (See Comments)    REACTION: GI upset   Atorvastatin     REACTION: pain   Simvastatin     REACTION: muscle pain   Current Outpatient Medications on File Prior to Visit  Medication Sig Dispense Refill   acetaminophen (TYLENOL) 650 MG CR tablet Take 650 mg by mouth 2 (two) times daily as needed for pain.      amLODipine (NORVASC) 5 MG tablet Take 1 tablet (5 mg total) by mouth daily. 90 tablet 3   busPIRone (BUSPAR) 15 MG tablet TAKE 1/4 TABLET BY MOUTH TWICE A DAY 45 tablet 1   levothyroxine (SYNTHROID) 50 MCG tablet Take 1 tablet (50 mcg total) by mouth daily before breakfast. 30 tablet 3   memantine (NAMENDA) 10 MG tablet Take 1 tablet (10 mg total) by mouth 2 (two) times daily. 180 tablet 1   vitamin B-12 (CYANOCOBALAMIN) 1000 MCG tablet Take  1,000 mcg by mouth daily.     No current facility-administered medications on file prior to visit.     Review of Systems  Constitutional:  Negative for activity change, appetite change, fatigue, fever and unexpected weight change.  HENT:  Negative for congestion, ear pain, rhinorrhea, sinus pressure and sore throat.   Eyes:  Negative for pain, redness and visual disturbance.  Respiratory:  Positive for cough. Negative for chest tightness, shortness of breath, wheezing and stridor.   Cardiovascular:  Negative for chest pain and palpitations.  Gastrointestinal:  Negative for abdominal pain, blood in stool, constipation and diarrhea.  Endocrine: Negative for polydipsia and polyuria.  Genitourinary:  Negative for dysuria, frequency and urgency.  Musculoskeletal:  Negative for arthralgias, back pain and myalgias.  Skin:  Negative for pallor and rash.  Allergic/Immunologic: Negative for environmental allergies.  Neurological:  Negative for dizziness, syncope and headaches.  Hematological:  Negative for adenopathy. Does not bruise/bleed easily.  Psychiatric/Behavioral:  Negative for decreased concentration and dysphoric mood. The patient is not nervous/anxious.       Objective:   Physical Exam Constitutional:      General: She is not in acute distress.    Appearance: Normal appearance. She is normal weight. She is not ill-appearing or diaphoretic.  HENT:     Head: Normocephalic.     Comments: No sinus tenderness    Nose: Congestion present.     Mouth/Throat:     Mouth: Mucous membranes are moist.     Pharynx: Oropharynx is clear. No posterior oropharyngeal erythema.  Eyes:     General:        Right eye: No discharge.        Left eye: No discharge.     Conjunctiva/sclera: Conjunctivae normal.     Pupils: Pupils are equal, round, and reactive to light.  Cardiovascular:     Rate and Rhythm: Normal rate and regular rhythm.     Heart sounds: Normal heart sounds.  Pulmonary:      Effort: Pulmonary effort is normal. No respiratory distress.     Breath sounds: Normal breath sounds. No stridor. No wheezing, rhonchi or rales.     Comments: Diffusely distant bs  Musculoskeletal:     Cervical back: Normal range of motion and neck supple.  Lymphadenopathy:     Cervical: No cervical adenopathy.  Skin:    General: Skin is warm and dry.     Coloration: Skin is not pale.     Findings: No erythema or rash.     Comments: 5 mm pedunculated  mass on R cheek resembling cyst or large seb hyperplasia Unable to express material from it  No redness Not tender   Neurological:     Mental Status: She is alert.  Psychiatric:        Mood and Affect: Mood normal.          Assessment & Plan:   Problem List Items Addressed This Visit       Musculoskeletal and Integument   Epidermal cyst of face    Resembles epidermal cyst vs seb hyperplasia  Desires removal  Unable to express material from it  Ref done to dermatology to eval and tx  inst to alert [place] if any s/s of infection       Relevant Orders   Ambulatory referral to Dermatology     Other   Cough - Primary    With uri-now improving  Reassuring exam  Has almost completely quit smoking/commended Uses DM otc  Can add tessalon -has as well Disc sympt care  ER precautions disc F/u if no further improvement       TOBACCO ABUSE    Pt has almost completely quit smoking  Commended! Enc to keep working on it

## 2021-04-22 ENCOUNTER — Telehealth: Payer: Self-pay | Admitting: *Deleted

## 2021-04-22 NOTE — Telephone Encounter (Signed)
Received surgical clearance forms from [facility]. Per Dr. Glori Bickers pt needs surgical clearance appt, she will need EKG and labs (they are required). ? ?Please schedule a surgical clearance appt ?

## 2021-05-05 ENCOUNTER — Encounter: Payer: Self-pay | Admitting: Family Medicine

## 2021-05-05 ENCOUNTER — Ambulatory Visit (INDEPENDENT_AMBULATORY_CARE_PROVIDER_SITE_OTHER)
Admission: RE | Admit: 2021-05-05 | Discharge: 2021-05-05 | Disposition: A | Discharge status: Home / Self Care | Payer: Medicare HMO | Source: Ambulatory Visit | Attending: Family Medicine | Admitting: Family Medicine

## 2021-05-05 ENCOUNTER — Other Ambulatory Visit: Payer: Self-pay

## 2021-05-05 ENCOUNTER — Other Ambulatory Visit: Payer: Self-pay | Admitting: Family Medicine

## 2021-05-05 ENCOUNTER — Ambulatory Visit (INDEPENDENT_AMBULATORY_CARE_PROVIDER_SITE_OTHER): Payer: Medicare HMO | Admitting: Family Medicine

## 2021-05-05 VITALS — BP 128/84 | HR 63 | Temp 97.4°F | Ht 62.0 in | Wt 139.0 lb

## 2021-05-05 DIAGNOSIS — Z01818 Encounter for other preprocedural examination: Secondary | ICD-10-CM | POA: Diagnosis not present

## 2021-05-05 DIAGNOSIS — R053 Chronic cough: Secondary | ICD-10-CM | POA: Diagnosis not present

## 2021-05-05 DIAGNOSIS — Z23 Encounter for immunization: Secondary | ICD-10-CM | POA: Diagnosis not present

## 2021-05-05 DIAGNOSIS — I10 Essential (primary) hypertension: Secondary | ICD-10-CM

## 2021-05-05 DIAGNOSIS — E7800 Pure hypercholesterolemia, unspecified: Secondary | ICD-10-CM

## 2021-05-05 DIAGNOSIS — J449 Chronic obstructive pulmonary disease, unspecified: Secondary | ICD-10-CM | POA: Diagnosis not present

## 2021-05-05 DIAGNOSIS — E039 Hypothyroidism, unspecified: Secondary | ICD-10-CM

## 2021-05-05 DIAGNOSIS — Z87891 Personal history of nicotine dependence: Secondary | ICD-10-CM | POA: Diagnosis not present

## 2021-05-05 DIAGNOSIS — L304 Erythema intertrigo: Secondary | ICD-10-CM | POA: Diagnosis not present

## 2021-05-05 DIAGNOSIS — I700 Atherosclerosis of aorta: Secondary | ICD-10-CM | POA: Diagnosis not present

## 2021-05-05 LAB — CBC WITH DIFFERENTIAL/PLATELET
Basophils Absolute: 0.1 10*3/uL (ref 0.0–0.1)
Basophils Relative: 0.9 % (ref 0.0–3.0)
Eosinophils Absolute: 0.1 10*3/uL (ref 0.0–0.7)
Eosinophils Relative: 1.7 % (ref 0.0–5.0)
HCT: 43.1 % (ref 36.0–46.0)
Hemoglobin: 14 g/dL (ref 12.0–15.0)
Lymphocytes Relative: 20.1 % (ref 12.0–46.0)
Lymphs Abs: 1.4 10*3/uL (ref 0.7–4.0)
MCHC: 32.5 g/dL (ref 30.0–36.0)
MCV: 96.4 fl (ref 78.0–100.0)
Monocytes Absolute: 0.6 10*3/uL (ref 0.1–1.0)
Monocytes Relative: 8.9 % (ref 3.0–12.0)
Neutro Abs: 4.9 10*3/uL (ref 1.4–7.7)
Neutrophils Relative %: 68.4 % (ref 43.0–77.0)
Platelets: 222 10*3/uL (ref 150.0–400.0)
RBC: 4.47 Mil/uL (ref 3.87–5.11)
RDW: 14 % (ref 11.5–15.5)
WBC: 7.2 10*3/uL (ref 4.0–10.5)

## 2021-05-05 LAB — COMPREHENSIVE METABOLIC PANEL
ALT: 12 U/L (ref 0–35)
AST: 15 U/L (ref 0–37)
Albumin: 4 g/dL (ref 3.5–5.2)
Alkaline Phosphatase: 71 U/L (ref 39–117)
BUN: 10 mg/dL (ref 6–23)
CO2: 33 mEq/L — ABNORMAL HIGH (ref 19–32)
Calcium: 9 mg/dL (ref 8.4–10.5)
Chloride: 103 mEq/L (ref 96–112)
Creatinine, Ser: 0.81 mg/dL (ref 0.40–1.20)
GFR: 72.84 mL/min (ref >60.00)
Glucose, Bld: 88 mg/dL (ref 70–99)
Potassium: 4.7 mEq/L (ref 3.5–5.1)
Sodium: 141 mEq/L (ref 135–145)
Total Bilirubin: 0.6 mg/dL (ref 0.2–1.2)
Total Protein: 6.1 g/dL (ref 6.0–8.3)

## 2021-05-05 LAB — TSH: TSH: 3.3 u[IU]/mL (ref 0.35–5.50)

## 2021-05-05 LAB — LIPID PANEL
Cholesterol: 163 mg/dL (ref 0–200)
HDL: 66.1 mg/dL (ref >39.00)
LDL Cholesterol: 77 mg/dL (ref 0–99)
NonHDL: 96.71
Total CHOL/HDL Ratio: 2
Triglycerides: 100 mg/dL (ref 0.0–149.0)
VLDL: 20 mg/dL (ref 0.0–40.0)

## 2021-05-05 MED ORDER — NYSTATIN 100000 UNIT/GM EX CREA: 1.0000 "application " | TOPICAL_CREAM | Freq: Two times a day (BID) | CUTANEOUS | 3 refills | Status: DC | PRN

## 2021-05-05 MED ORDER — PREDNISONE 10 MG PO TABS: ORAL_TABLET | ORAL | 0 refills | Status: DC

## 2021-05-05 MED ORDER — BENZONATATE 200 MG PO CAPS: 200.0000 mg | ORAL_CAPSULE | Freq: Three times a day (TID) | ORAL | 3 refills | Status: DC | PRN

## 2021-05-05 NOTE — Assessment & Plan Note (Signed)
bp in fair control at this time  ?BP Readings from Last 1 Encounters:  ?05/05/21 128/84  ? ?No changes needed ?Most recent labs reviewed  ?Disc lifstyle change with low sodium diet and exercise  ?Plan to continue amlodipine 5 mg daily ?Commended on smoking cessation ?Labs today ?

## 2021-05-05 NOTE — Assessment & Plan Note (Signed)
Disc goals for lipids and reasons to control them ?Rev last labs with pt ?Rev low sat fat diet in detail ?Meds ordered ?Continues rosuvastatin 10 mg daily ?

## 2021-05-05 NOTE — Assessment & Plan Note (Signed)
Commended on recent smoking cessation ?Overall doing well ?Smoker's cough noted ?

## 2021-05-05 NOTE — Patient Instructions (Addendum)
Take the prednisone taper as directed for cough ?It may cause you to feel jittery/hyper or hungry ?Take Tessalon also for cough as needed ?Keep up the good fluid intake ? ?Chest x-ray today ?Labs today ? ?If cough worsens or changes let us know  ?If not improving in 1-2 weeks let us know ? ?

## 2021-05-05 NOTE — Assessment & Plan Note (Signed)
No clinical changes ?TSH today ?Taking levothyroxine 50 mg daily ?

## 2021-05-05 NOTE — Assessment & Plan Note (Signed)
No longer smoking ?Plan to continue with risk factor control ?

## 2021-05-05 NOTE — Progress Notes (Signed)
Subjective:    Patient ID: Tammy Neal, female    DOB: May 05, 1950, 71 y.o.   MRN: [id number]  This visit occurred during the SARS-CoV-2 public health emergency.  Safety protocols were in place, including screening questions prior to the visit, additional usage of staff PPE, and extensive cleaning of exam room while observing appropriate contact time as indicated for disinfecting solutions.   HPI Pt presents for pre op exam/clearance  Wt Readings from Last 3 Encounters:  05/05/21 139 lb (63 kg)  04/13/21 135 lb 8 oz (61.5 kg)  12/29/20 138 lb (62.6 kg)   25.42 kg/m  Still has a lot of cough  Clear mucous  No wheezing  Baseline rattle from smoker's cough   After last visit here she had a bad cough spell in the car  Ever since then coughs more   Planning total hip replacement with Dr Despina Hick  (R) in June  He knows her well/did other hip and neck    Plan to stay overnight in hospital (esp in light of dementia)   Did great with last one   No inhalers   EKG today no change from baseline  NSR rate of 67 with short PR   Last chest xray was a while ago  02 is 97%   Lab Results  Component Value Date   WBC 8.1 04/29/2020   HGB 14.7 04/29/2020   HCT 44.1 04/29/2020   MCV 94.1 04/29/2020   PLT 232.0 04/29/2020   Lab Results  Component Value Date   CREATININE 0.87 04/29/2020   BUN 13 04/29/2020   NA 142 04/29/2020   K 4.5 04/29/2020   CL 104 04/29/2020   CO2 33 (H) 04/29/2020    No heartburn  Some post nasal drip on /off   Smoking status : not at , last cig was feb if at all  Copd-not a problem    HTN-amlodipine 5 mg daily   BP Readings from Last 3 Encounters:  05/05/21 128/84  04/13/21 128/60  12/29/20 132/75   Pulse Readings from Last 3 Encounters:  05/05/21 63  04/13/21 63  12/29/20 67    Drug allergies Diclofenac causes anaphylaxis  GI upset with asa  No tape or topical allergies   No problems with anesthesia    Multiple surgeries in  the past    Hypothyroidism  Pt has no clinical changes No change in energy level/ hair or skin/ edema and no tremor Lab Results  Component Value Date   TSH 4.56 (H) 07/31/2020     Due for TSH Levothyroxine 50 mcg daily   Patient Active Problem List   Diagnosis Date Noted   Epidermal cyst of face 04/13/2021   Right hip pain 12/29/2020   Hypothyroid 06/23/2020   Elevated TSH 06/22/2020   Essential hypertension 04/29/2020   Spondylosis without myelopathy or radiculopathy, cervical region 02/28/2020   Low vitamin B12 level 03/20/2018   Anxiety disorder 03/20/2018   Aortic atherosclerosis (HCC) 11/06/2016   Arthritis of left hip 07/13/2016   Estrogen deficiency 12/29/2015   Cough 04/30/2015   Mild cognitive impairment 01/02/2015   Intertrigo 11/02/2011   Routine general medical examination at a health care facility 10/26/2011   Gynecological examination 07/05/2010   DEPRESSION 05/03/2010   Neck pain 06/10/2008   Hyperlipidemia 02/26/2007   TOBACCO ABUSE 02/26/2007   PSORIASIS 02/26/2007   Past Medical History:  Diagnosis Date   Allergy    allergic rhinitis   Arthritis    COPD (chronic  obstructive pulmonary disease) (HCC)    Hyperlipidemia    no meds   Neck pain, chronic    with fusion   Osteoporosis    Psoriasis    Past Surgical History:  Procedure Laterality Date   ABDOMINAL HYSTERECTOMY     endometriosis 1 ovary left   APPENDECTOMY     NECK SURGERY     SPINE SURGERY  1977   cervical fusion Dr Channing Mutters x2   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   Procedure: LEFT TOTAL HIP ARTHROPLASTY ANTERIOR APPROACH;  Surgeon: Ollen Gross, MD;  Location: [facility];  Service: Orthopedics;  Laterality: Left;   Social History   Tobacco Use   Smoking status: Every Day    Packs/day: 0.25    Types: Cigarettes   Smokeless tobacco: Never  Substance Use Topics   Alcohol use: No    Alcohol/week: 0.0 standard drinks   Drug use: No   Family History  Problem Relation Age of  Onset   Hypertension Mother    Diabetes Mother    Stroke Mother 46   Asthma Father    COPD Father    Colon cancer Neg Hx    Esophageal cancer Neg Hx    Ulcerative colitis Neg Hx    Stomach cancer Neg Hx    Allergies  Allergen Reactions   Voltaren [Diclofenac Sodium] Anaphylaxis   Diclofenac Sodium Hives and Itching   Aricept [Donepezil Hcl] Itching    Itchy, jittery (no hives)   Aspirin Other (See Comments)    REACTION: GI upset   Atorvastatin     REACTION: pain   Simvastatin     REACTION: muscle pain   Current Outpatient Medications on File Prior to Visit  Medication Sig Dispense Refill   acetaminophen (TYLENOL) 650 MG CR tablet Take 650 mg by mouth 2 (two) times daily as needed for pain.      amLODipine (NORVASC) 5 MG tablet Take 1 tablet (5 mg total) by mouth daily. 90 tablet 3   busPIRone (BUSPAR) 15 MG tablet TAKE 1/4 TABLET BY MOUTH TWICE A DAY 45 tablet 1   levothyroxine (SYNTHROID) 50 MCG tablet Take 1 tablet (50 mcg total) by mouth daily before breakfast. 30 tablet 3   memantine (NAMENDA) 10 MG tablet Take 1 tablet (10 mg total) by mouth 2 (two) times daily. 180 tablet 1   rosuvastatin (CRESTOR) 10 MG tablet TAKE 1 TABLET BY MOUTH TWICE WEEKLY 24 tablet 3   vitamin B-12 (CYANOCOBALAMIN) 1000 MCG tablet Take 1,000 mcg by mouth daily.     No current facility-administered medications on file prior to visit.    Review of Systems  Constitutional:  Negative for activity change, appetite change, fatigue, fever and unexpected weight change.  HENT:  Negative for congestion, ear pain, rhinorrhea, sinus pressure and sore throat.   Eyes:  Negative for pain, redness and visual disturbance.  Respiratory:  Negative for cough, shortness of breath and wheezing.   Cardiovascular:  Negative for chest pain and palpitations.  Gastrointestinal:  Negative for abdominal pain, blood in stool, constipation and diarrhea.  Endocrine: Negative for polydipsia and polyuria.  Genitourinary:   Negative for dysuria, frequency and urgency.  Musculoskeletal:  Negative for arthralgias, back pain and myalgias.       Right hip pain    Skin:  Negative for pallor and rash.  Allergic/Immunologic: Negative for environmental allergies.  Neurological:  Negative for dizziness, syncope and headaches.  Hematological:  Negative for adenopathy. Does not bruise/bleed easily.  Psychiatric/Behavioral:  Positive for decreased concentration. Negative for dysphoric mood. The patient is not nervous/anxious.       Objective:   Physical Exam Constitutional:      General: She is not in acute distress.    Appearance: Normal appearance. She is well-developed and normal weight. She is not ill-appearing or diaphoretic.  HENT:     Head: Normocephalic and atraumatic.  Eyes:     Conjunctiva/sclera: Conjunctivae normal.     Pupils: Pupils are equal, round, and reactive to light.  Neck:     Thyroid: No thyromegaly.     Vascular: No carotid bruit or JVD.     Comments: No change in thyroid  Cardiovascular:     Rate and Rhythm: Normal rate and regular rhythm.     Heart sounds: Normal heart sounds.    No gallop.  Pulmonary:     Effort: Pulmonary effort is normal. No respiratory distress.     Breath sounds: Normal breath sounds. No wheezing or rales.  Abdominal:     General: Abdomen is flat. Bowel sounds are normal. There is no distension or abdominal bruit.     Palpations: Abdomen is soft.     Tenderness: There is no abdominal tenderness.  Musculoskeletal:     Cervical back: Normal range of motion and neck supple.     Right lower leg: No edema.     Left lower leg: No edema.  Lymphadenopathy:     Cervical: No cervical adenopathy.  Skin:    General: Skin is warm and dry.     Coloration: Skin is not jaundiced or pale.     Findings: No erythema or rash.  Neurological:     Mental Status: She is alert.     Cranial Nerves: No cranial nerve deficit.     Coordination: Coordination normal.     Deep Tendon  Reflexes: Reflexes are normal and symmetric. Reflexes normal.  Psychiatric:        Mood and Affect: Mood normal.          Assessment & Plan:   Problem List Items Addressed This Visit       Cardiovascular and Mediastinum   Aortic atherosclerosis (HCC)    No longer smoking Plan to continue with risk factor control      Essential hypertension    bp in fair control at this time  BP Readings from Last 1 Encounters:  05/05/21 128/84  No changes needed Most recent labs reviewed  Disc lifstyle change with low sodium diet and exercise  Plan to continue amlodipine 5 mg daily Commended on smoking cessation Labs today      Relevant Orders   Comprehensive metabolic panel   CBC with Differential/Platelet   Lipid panel   TSH     Endocrine   Hypothyroid    No clinical changes TSH today Taking levothyroxine 50 mg daily        Musculoskeletal and Integument   Intertrigo     Other   Cough    Ongoing postviral cough and a former smoker She recently quit smoking and this could cause some phlegm Reassuring exam today with clear breath sounds and no wheezing Chest x-ray ordered We will try a prednisone taper 40 mg, side effects reviewed We will update if no further improvement or worsening       Relevant Orders   DG Chest 2 View   Former smoker    Commended on recent smoking cessation Overall doing well Smoker's cough noted  Hyperlipidemia    Disc goals for lipids and reasons to control them Rev last labs with pt Rev low sat fat diet in detail Meds ordered Continues rosuvastatin 10 mg daily      Pre-op examination - Primary    Patient is planning total hip replacement on the right in June with Dr. Antony Odea No longer smoking Has done well with all anesthesia in the past and did well with her previous hip replacement Chest x-ray ordered Pulse ox today is 97% with normal vitals and good blood pressure control Reviewed drug allergies including diclofenac which  causes anaphylaxis No reported sensitivity to topical products or tape No change in baseline EKG today and no cardiac history Chest x-ray ordered No restrictions for upcoming surgery      Relevant Orders   EKG 12-Lead (Completed)   DG Chest 2 View   Other Visit Diagnoses     Need for immunization against influenza       Relevant Orders   Flu Vaccine QUAD High Dose(Fluad) (Completed)

## 2021-05-05 NOTE — Assessment & Plan Note (Signed)
Patient is planning total hip replacement on the right in June with Dr. Reynaldo Minium ?No longer smoking ?Has done well with all anesthesia in the past and did well with her previous hip replacement ?Chest x-ray ordered ?Pulse ox today is 97% with normal vitals and good blood pressure control ?Reviewed drug allergies including diclofenac which causes anaphylaxis ?No reported sensitivity to topical products or tape ?No change in baseline EKG today and no cardiac history ?Chest x-ray ordered ?No restrictions for upcoming surgery ?

## 2021-05-05 NOTE — Assessment & Plan Note (Signed)
Ongoing postviral cough and a former smoker ?She recently quit smoking and this could cause some phlegm ?Reassuring exam today with clear breath sounds and no wheezing ?Chest x-ray ordered ?We will try a prednisone taper 40 mg, side effects reviewed ?We will update if no further improvement or worsening ? ?

## 2021-05-07 ENCOUNTER — Other Ambulatory Visit: Payer: Self-pay

## 2021-05-07 MED ORDER — MEMANTINE HCL 10 MG PO TABS: 10.0000 mg | ORAL_TABLET | Freq: Two times a day (BID) | ORAL | 1 refills | Status: DC

## 2021-05-07 NOTE — Telephone Encounter (Signed)
Patient's daughter Ronny Bacon is calling to follow up on prescription for Namenda. Patient is out of this prescription and need a refill. Please advise?

## 2021-05-12 ENCOUNTER — Other Ambulatory Visit: Payer: Self-pay | Admitting: Family Medicine

## 2021-05-17 ENCOUNTER — Telehealth: Payer: Self-pay | Admitting: Family Medicine

## 2021-05-17 NOTE — Telephone Encounter (Signed)
Pt daughter returning your call. [phone] ?

## 2021-05-17 NOTE — Telephone Encounter (Signed)
Pt daughter called stating that pt is due to have hip surgery on 08/11/21. Pt daughter states that pt bent over to pick something up and tumbled to the floor. Pt daughter is asking if Dr Glori Bickers would like pt to come in to make sure that pt hasn't done anything else to hurt hip. Please advise. ?

## 2021-05-17 NOTE — Telephone Encounter (Signed)
Agree with ER precautions  °Will see her then °

## 2021-05-17 NOTE — Telephone Encounter (Signed)
Spoke to patient's daughter by telephone and was advised that her mom fell Saturday. Tammy Neal stated that her mom has not complained much about her right hip but she is limping more on that leg today. Tammy Neal checked her mom while on the phone and she does not have any bruising. Tammy Neal stated that her mom has hip replacement surgery scheduled in June. Tammy Neal stated that she would feel better if she can bring her mom in and have Dr. Glori Bickers check her  mom out from the fall. Appointment scheduled with Dr. Glori Bickers tomorrow 05/18/21 at 9:30. Tammy Neal was advised to have her mom use some ice on it and to elevate her leg while sitting around. Tammy Neal ws given ER precautions and she verbalized understanding.  ?

## 2021-05-17 NOTE — Telephone Encounter (Signed)
Please ask what symptoms/triage ?Thanks  ?

## 2021-05-17 NOTE — Telephone Encounter (Signed)
Tried to call patient no answer at home number ?Left a message on voicemail for patient to call the office back. ?

## 2021-05-18 ENCOUNTER — Other Ambulatory Visit: Payer: Self-pay

## 2021-05-18 ENCOUNTER — Ambulatory Visit (INDEPENDENT_AMBULATORY_CARE_PROVIDER_SITE_OTHER)
Admission: RE | Admit: 2021-05-18 | Discharge: 2021-05-18 | Disposition: A | Discharge status: Home / Self Care | Payer: Medicare HMO | Source: Ambulatory Visit | Attending: Family Medicine | Admitting: Family Medicine

## 2021-05-18 ENCOUNTER — Ambulatory Visit (INDEPENDENT_AMBULATORY_CARE_PROVIDER_SITE_OTHER): Payer: Medicare HMO | Admitting: Family Medicine

## 2021-05-18 ENCOUNTER — Encounter: Payer: Self-pay | Admitting: Family Medicine

## 2021-05-18 VITALS — BP 118/72 | HR 72 | Temp 97.9°F | Ht 62.0 in | Wt 138.1 lb

## 2021-05-18 DIAGNOSIS — M25551 Pain in right hip: Secondary | ICD-10-CM

## 2021-05-18 NOTE — Progress Notes (Signed)
? ?Subjective:  ? ? Patient ID: Tammy Neal, female    DOB: 11/06/1950, 71 y.o.   MRN: [id number] ? ?This visit occurred during the SARS-CoV-2 public health emergency.  Safety protocols were in place, including screening questions prior to the visit, additional usage of staff PPE, and extensive cleaning of exam room while observing appropriate contact time as indicated for disinfecting solutions.  ? ?HPI ?Pt presents for fall/ hip injury  ? ?Wt Readings from Last 3 Encounters:  ?05/18/21 138 lb 2 oz (62.7 kg)  ?05/05/21 139 lb (63 kg)  ?04/13/21 135 lb 8 oz (61.5 kg)  ? ?25.26 kg/m? ? ? ?Planning a hip replacement (right) in June ?Had a fall on Saturday  (family heard it) , fell trying to pick up a stink bug in the house  ?Got up on her own before family came in  ?Limping more on R leg yesterday , hurts a bit more  ?Points to back of thigh  ?No bruising seen  ? ?No foot or ankle pain  ?No back pain  ? ? ? ? ? ? ?Patient Active Problem List  ? Diagnosis Date Noted  ? Epidermal cyst of face 04/13/2021  ? Right hip pain 12/29/2020  ? Hypothyroid 06/23/2020  ? Essential hypertension 04/29/2020  ? Spondylosis without myelopathy or radiculopathy, cervical region 02/28/2020  ? Low vitamin B12 level 03/20/2018  ? Anxiety disorder 03/20/2018  ? Aortic atherosclerosis ([place]) 11/06/2016  ? Pre-op examination 11/04/2016  ? Arthritis of left hip 07/13/2016  ? Estrogen deficiency 12/29/2015  ? Cough 04/30/2015  ? Mild cognitive impairment 01/02/2015  ? Intertrigo 11/02/2011  ? Routine general medical examination at a health care facility 10/26/2011  ? Gynecological examination 07/05/2010  ? DEPRESSION 05/03/2010  ? Neck pain 06/10/2008  ? Hyperlipidemia 02/26/2007  ? Former smoker 02/26/2007  ? PSORIASIS 02/26/2007  ? ?Past Medical History:  ?Diagnosis Date  ? Allergy   ? allergic rhinitis  ? Arthritis   ? COPD (chronic obstructive pulmonary disease) ([place])   ? Hyperlipidemia   ? no meds  ? Neck pain, chronic   ? with fusion  ?  Osteoporosis   ? Psoriasis   ? ?Past Surgical History:  ?Procedure Laterality Date  ? ABDOMINAL HYSTERECTOMY    ? endometriosis 1 ovary left  ? APPENDECTOMY    ? NECK SURGERY    ? [place]  ? cervical fusion Dr Carloyn Manner x2  ? 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  ? Procedure: LEFT TOTAL HIP ARTHROPLASTY ANTERIOR APPROACH;  Surgeon: Gaynelle Arabian, MD;  Location: [facility];  Service: Orthopedics;  Laterality: Left;  ? ?Social History  ? ?Tobacco Use  ? Smoking status: Every Day  ?  Packs/day: 0.25  ?  Types: Cigarettes  ? Smokeless tobacco: Never  ?Substance Use Topics  ? Alcohol use: No  ?  Alcohol/week: 0.0 standard drinks  ? Drug use: No  ? ?Family History  ?Problem Relation Age of Onset  ? Hypertension Mother   ? Diabetes Mother   ? Stroke Mother 37  ? Asthma Father   ? COPD Father   ? Colon cancer Neg Hx   ? Esophageal cancer Neg Hx   ? Ulcerative colitis Neg Hx   ? Stomach cancer Neg Hx   ? ?Allergies  ?Allergen Reactions  ? Voltaren [Diclofenac Sodium] Anaphylaxis  ? Diclofenac Sodium Hives and Itching  ? Aricept [Donepezil Hcl] Itching  ?  Itchy, jittery (no hives)  ? Aspirin Other (See  Comments)  ?  REACTION: GI upset  ? Atorvastatin   ?  REACTION: pain  ? Simvastatin   ?  REACTION: muscle pain  ? ?Current Outpatient Medications on File Prior to Visit  ?Medication Sig Dispense Refill  ? acetaminophen (TYLENOL) 650 MG CR tablet Take 650 mg by mouth 2 (two) times daily as needed for pain.     ? amLODipine (NORVASC) 5 MG tablet Take 1 tablet (5 mg total) by mouth daily. 90 tablet 3  ? benzonatate (TESSALON) 200 MG capsule Take 1 capsule (200 mg total) by mouth 3 (three) times daily as needed. 30 capsule 3  ? busPIRone (BUSPAR) 15 MG tablet TAKE 1/4 TABLET BY MOUTH TWICE A DAY 45 tablet 1  ? levothyroxine (SYNTHROID) 50 MCG tablet TAKE 1 TABLET BY MOUTH ONCE A DAY BEFOREBREAKFAST. 90 tablet 1  ? memantine (NAMENDA) 10 MG tablet Take 1 tablet (10 mg total) by mouth 2 (two) times daily. 180 tablet 1  ?  nystatin cream (MYCOSTATIN) Apply 1 application. topically 2 (two) times daily as needed for dry skin (yeast rash). 30 g 3  ? rosuvastatin (CRESTOR) 10 MG tablet TAKE 1 TABLET BY MOUTH TWICE WEEKLY 24 tablet 3  ? vitamin B-12 (CYANOCOBALAMIN) 1000 MCG tablet Take 1,000 mcg by mouth daily.    ? ?No current facility-administered medications on file prior to visit.  ?  ? ?Review of Systems  ?Constitutional:  Negative for activity change, appetite change, fatigue, fever and unexpected weight change.  ?HENT:  Negative for congestion, ear pain, rhinorrhea, sinus pressure and sore throat.   ?Eyes:  Negative for pain, redness and visual disturbance.  ?Respiratory:  Negative for cough, shortness of breath and wheezing.   ?Cardiovascular:  Negative for chest pain and palpitations.  ?Gastrointestinal:  Negative for abdominal pain, blood in stool, constipation and diarrhea.  ?Endocrine: Negative for polydipsia and polyuria.  ?Genitourinary:  Negative for dysuria, frequency and urgency.  ?Musculoskeletal:  Negative for arthralgias, back pain and myalgias.  ?     Right hip pain   ?Skin:  Negative for pallor and rash.  ?Allergic/Immunologic: Negative for environmental allergies.  ?Neurological:  Negative for dizziness, syncope and headaches.  ?Hematological:  Negative for adenopathy. Does not bruise/bleed easily.  ?Psychiatric/Behavioral:  Negative for decreased concentration and dysphoric mood. The patient is not nervous/anxious.   ? ?   ?Objective:  ? Physical Exam ?Constitutional:   ?   General: She is not in acute distress. ?   Appearance: Normal appearance. She is normal weight. She is not ill-appearing or diaphoretic.  ?Eyes:  ?   General: No scleral icterus. ?   Conjunctiva/sclera: Conjunctivae normal.  ?   Pupils: Pupils are equal, round, and reactive to light.  ?Cardiovascular:  ?   Rate and Rhythm: Normal rate and regular rhythm.  ?   Heart sounds: Normal heart sounds.  ?Pulmonary:  ?   Effort: Pulmonary effort is  normal. No respiratory distress.  ?Musculoskeletal:  ?   Cervical back: Normal range of motion and neck supple.  ?   Comments: R hip  ?Mild lateral/trochanteric tenderness  ?No crepitus or skin change or swelling ?No groin tenderness  ? ?Pain with external rotation more than internal /limited ?Favors L leg with gait   ?Lymphadenopathy:  ?   Cervical: No cervical adenopathy.  ?Skin: ?   Coloration: Skin is not pale.  ?   Findings: No bruising or erythema.  ?Neurological:  ?   Mental Status: She is alert.  ?  Motor: No weakness.  ?Psychiatric:     ?   Mood and Affect: Mood normal.  ? ? ? ? ? ?   ?Assessment & Plan:  ? ?Problem List Items Addressed This Visit   ? ?  ? Other  ? Right hip pain - Primary  ?  Baseline severe OA, now increase in lateral pain after fall on L side  ?Reassuring exam/some trochanter tenderness otherwise no change from baseline  ?Xray ordered to r/o fx or new changes ?Adv to use cane ?Tylenol prn  ?Ice/heat prn  ?voltaren gel otc prn  ?Update if not starting to improve in a week or if worsening  ?Rad rev to follow ?  ?  ? Relevant Orders  ? DG HIP UNILAT WITH PELVIS 2-3 VIEWS RIGHT (Completed)  ? ? ? ?

## 2021-05-18 NOTE — Patient Instructions (Addendum)
Continue tylenol as needed  ? ?Gentle heat and ice for 10 minutes at a time are ok to the painful area  ?Voltaren gel over the counter is helpful also  ?Keep using the cane for support  ? ?We will contact you with result  ? ?If symptoms suddenly worsen or change let us know  ? ? ? ? ?

## 2021-05-18 NOTE — Assessment & Plan Note (Signed)
Baseline severe OA, now increase in lateral pain after fall on L side  ?Reassuring exam/some trochanter tenderness otherwise no change from baseline  ?Xray ordered to r/o fx or new changes ?Adv to use cane ?Tylenol prn  ?Ice/heat prn  ?voltaren gel otc prn  ?Update if not starting to improve in a week or if worsening  ?Rad rev to follow ?

## 2021-06-07 ENCOUNTER — Encounter: Payer: Self-pay | Admitting: Family Medicine

## 2021-06-07 DIAGNOSIS — I10 Essential (primary) hypertension: Secondary | ICD-10-CM

## 2021-06-07 DIAGNOSIS — F411 Generalized anxiety disorder: Secondary | ICD-10-CM

## 2021-06-07 DIAGNOSIS — Z87891 Personal history of nicotine dependence: Secondary | ICD-10-CM

## 2021-06-07 DIAGNOSIS — G3184 Mild cognitive impairment, so stated: Secondary | ICD-10-CM

## 2021-06-07 DIAGNOSIS — E7800 Pure hypercholesterolemia, unspecified: Secondary | ICD-10-CM

## 2021-06-07 DIAGNOSIS — R1310 Dysphagia, unspecified: Secondary | ICD-10-CM

## 2021-06-09 DIAGNOSIS — R1310 Dysphagia, unspecified: Secondary | ICD-10-CM | POA: Insufficient documentation

## 2021-06-10 ENCOUNTER — Telehealth: Payer: Self-pay | Admitting: Family Medicine

## 2021-06-10 NOTE — Progress Notes (Signed)
?  Chronic Care Management  ? ?Note ? ?06/10/2021 ?Name: JAYLIANNA TATLOCK MRN: [id number] DOB: 1950/08/10 ? ?Namira D Holian is a 71 y.o. year old female who is a primary care patient of Tower, Wynelle Fanny, MD. I reached out to [name] by phone today in response to a referral sent by Ms. Kellan D Lipford's PCP, Tower, Wynelle Fanny, MD.  ? ?Ms. Tenny was given information about Chronic Care Management services today including:  ?CCM service includes personalized support from designated clinical staff supervised by her physician, including individualized plan of care and coordination with other care providers ?24/7 contact phone numbers for assistance for urgent and routine care needs. ?Service will only be billed when office clinical staff spend 20 minutes or more in a month to coordinate care. ?Only one practitioner may furnish and bill the service in a calendar month. ?The patient may stop CCM services at any time (effective at the end of the month) by phone call to the office staff. ? ? ?DAUGHTER OF PATIENT  verbally agreed to assistance and services provided by embedded care coordination/care management team today. ? ?Follow up plan:REFERRAL/NO COPAY ? ? ?Tatjana Dellinger ?Upstream Scheduler  ?

## 2021-06-14 ENCOUNTER — Telehealth (HOSPITAL_COMMUNITY): Payer: Self-pay

## 2021-06-14 NOTE — Addendum Note (Signed)
Addended by: Loura Pardon A on: 06/14/2021 01:53 PM ? ? Modules accepted: Orders ? ?

## 2021-06-14 NOTE — Telephone Encounter (Signed)
Spoke with daughter Ronny Bacon) of patient to schedule OP MBS - Daughter needs to call sister to correlate schedules before scheduling. Daughter will call back to schedule. ?

## 2021-06-21 ENCOUNTER — Other Ambulatory Visit (HOSPITAL_COMMUNITY): Payer: Self-pay | Admitting: *Deleted

## 2021-06-21 DIAGNOSIS — R1310 Dysphagia, unspecified: Secondary | ICD-10-CM

## 2021-06-23 ENCOUNTER — Telehealth: Payer: Self-pay

## 2021-06-23 NOTE — Chronic Care Management (AMB) (Signed)
? ? ?  Chronic Care Management ?Pharmacy Assistant  ? ?Name: Tammy Neal  MRN: [id number] DOB: 1950-04-27 ? ?Tammy Neal is an 71 y.o. year old female who presents for his initial CCM visit with the clinical pharmacist. ? ?Reason for Encounter: Initial Questions ?  ?Conditions to be addressed/monitored: ?HTN and HLD ? ? ?Recent office visits:  ?05/18/21-Marne Tower,MD(PCP)- right hip pain after fall-use ice and heat, OTC voltaren gel as needed, use cane ?05/05/21-Marne Tower,MD(PCP)-Pre-OP for orthopedic surgery.We will try a prednisone taper 40 mg, side effects reviewed(post viral cough) ?04/13/21-Marne Tower,MD(PCP)-cough,fatigue,-add tessalon Perles and OTC DM cough medicine.Dermatology referral ?12/29/20-Marne Tower,MD-(PCP)-Encourage smoking cessation-Right hip OA- use cane and tylenol arthritis-flu shot given. ? ?Recent consult visits:  ?04/09/21-Frank Aluisio,MD(Orthopedic)- no data found ?03/10/21-Richard Ramos,Md(PM&R)- no data found ? ?Hospital visits:  ?None in previous 6 months ? ?Medications: ?Outpatient Encounter Medications as of 06/23/2021  ?Medication Sig Note  ? acetaminophen (TYLENOL) 650 MG CR tablet Take 650 mg by mouth 2 (two) times daily as needed for pain.  01/02/2015: Received from: [facility]: Take by mouth.  ? amLODipine (NORVASC) 5 MG tablet Take 1 tablet (5 mg total) by mouth daily.   ? benzonatate (TESSALON) 200 MG capsule Take 1 capsule (200 mg total) by mouth 3 (three) times daily as needed.   ? busPIRone (BUSPAR) 15 MG tablet TAKE 1/4 TABLET BY MOUTH TWICE A DAY   ? levothyroxine (SYNTHROID) 50 MCG tablet TAKE 1 TABLET BY MOUTH ONCE A DAY BEFOREBREAKFAST.   ? memantine (NAMENDA) 10 MG tablet Take 1 tablet (10 mg total) by mouth 2 (two) times daily.   ? nystatin cream (MYCOSTATIN) Apply 1 application. topically 2 (two) times daily as needed for dry skin (yeast rash).   ? rosuvastatin (CRESTOR) 10 MG tablet TAKE 1 TABLET BY MOUTH TWICE WEEKLY   ? vitamin B-12  (CYANOCOBALAMIN) 1000 MCG tablet Take 1,000 mcg by mouth daily.   ? ?No facility-administered encounter medications on file as of 06/23/2021.  ? ? ?No results found for: HGBA1C, MICROALBUR  ? ?BP Readings from Last 3 Encounters:  ?05/18/21 118/72  ?05/05/21 128/84  ?04/13/21 128/60  ? ? ? ? ?Patient contacted to confirm in office appointment with Charlene Brooke, Pharm D, on 06/28/21 at 1:30pm. ? ?Do you have any problems getting your medications? No ? ?What is your top health concern you would like to discuss at your upcoming visit? Difficulty with swallowing some of the pills ? ?Have you seen any other providers since your last visit with PCP? No ? ? ? ?Star Rating Drugs:  ?Medication:  Last Fill: Day Supply ?Rosuvastatin '10mg'$  04/13/21 84 ? ? ?Care Gaps: ?Annual wellness visit in last year? No ?Most Recent BP reading:118/72 71-P ? ? ?Charlene Brooke, CPP notified ? ?Velora Horstman, CCMA ?Health concierge  ?[phone]  ?

## 2021-06-28 ENCOUNTER — Telehealth: Payer: Self-pay | Admitting: Pharmacist

## 2021-06-28 ENCOUNTER — Telehealth: Payer: Self-pay | Admitting: Family Medicine

## 2021-06-28 ENCOUNTER — Ambulatory Visit (INDEPENDENT_AMBULATORY_CARE_PROVIDER_SITE_OTHER): Payer: Medicare HMO | Admitting: Pharmacist

## 2021-06-28 DIAGNOSIS — E7800 Pure hypercholesterolemia, unspecified: Secondary | ICD-10-CM

## 2021-06-28 DIAGNOSIS — I10 Essential (primary) hypertension: Secondary | ICD-10-CM

## 2021-06-28 DIAGNOSIS — R1310 Dysphagia, unspecified: Secondary | ICD-10-CM

## 2021-06-28 DIAGNOSIS — G3184 Mild cognitive impairment, so stated: Secondary | ICD-10-CM

## 2021-06-28 DIAGNOSIS — F411 Generalized anxiety disorder: Secondary | ICD-10-CM

## 2021-06-28 NOTE — Telephone Encounter (Signed)
Yes, please make that change, I agree  ?Please schedule lab for TSH in 6-8 weeks  ?

## 2021-06-28 NOTE — Progress Notes (Signed)
? ?Chronic Care Management ?Pharmacy Note ? ?06/28/2021 ?Name:  Tammy Neal MRN:  [id number] DOB:  11-08-1950 ? ?Summary: CCM Initial visit ?-Pt is having trouble swallowing pills; reviewed her medications - some do have liquid formulations available (amlodipine, levothyroxine, memantine) but after discussion pt's family thinks preparing liquid doses will be logistically more difficult than crushing tablets ?-It is OK to crush her medications and mix with soft food (reviewed each medication individually) ?-Family reports pt is missing levothyroxine dose 2-3 times weekly due to logistics of separating it from other medications and waking patient up early to take it; given she has been missing doses, it is probably OK to change timing to take it with other AM meds - the decreased absorption will likely be balanced out by improved adherence ?-Pt has swallow study scheduled 06/30/21 ? ?Recommendations/Changes made from today's visit: ?-Advised to crush tablets and mix with soft food ?-Advised to take levothyroxine with other AM meds; repeat TSH in 6-8 weeks ? ?Plan: ?The patient has been provided with contact information for the care management team and has been advised to call with any health related questions or concerns.  ? ? ? ?Subjective: ?Tammy Neal is an 71 y.o. year old female who is a primary patient of Tower, Wynelle Fanny, MD.  The CCM team was consulted for assistance with disease management and care coordination needs.   ? ?Engaged with patient face to face for initial visit in response to provider referral for pharmacy case management and/or care coordination services.  ? ?Consent to Services:  ?The patient was given the following information about Chronic Care Management services today, agreed to services, and gave verbal consent: 1. CCM service includes personalized support from designated clinical staff supervised by the primary care provider, including individualized plan of care and coordination with  other care providers 2. 24/7 contact phone numbers for assistance for urgent and routine care needs. 3. Service will only be billed when office clinical staff spend 20 minutes or more in a month to coordinate care. 4. Only one practitioner may furnish and bill the service in a calendar month. 5.The patient may stop CCM services at any time (effective at the end of the month) by phone call to the office staff. 6. The patient will be responsible for cost sharing (co-pay) of up to 20% of the service fee (after annual deductible is met). Patient agreed to services and consent obtained. ? ?Patient Care Team: ?Tower, Wynelle Fanny, MD as PCP - General ?Charlton Haws, [facility] as Pharmacist (Pharmacist) ? ?Recent office visits: ?05/18/21-Marne Tower,MD(PCP)- right hip pain after fall-use ice and heat, OTC voltaren gel as needed, use cane ?05/05/21-Marne Tower,MD(PCP)-Pre-OP for orthopedic surgery.We will try a prednisone taper 40 mg, side effects reviewed(post viral cough) ?04/13/21-Marne Tower,MD(PCP)-cough,fatigue,-add tessalon Perles and OTC DM cough medicine.Dermatology referral ?12/29/20-Marne Tower,MD-(PCP)-Encourage smoking cessation-Right hip OA- use cane and tylenol arthritis-flu shot given. ? ?Recent consult visits: ?04/09/21-Frank Aluisio,MD(Orthopedic)- no data found ?03/10/21-Richard Ramos,Md(PM&R)- no data found ? ?Hospital visits: ?None in previous 6 months ? ? ?Objective: ? ?Lab Results  ?Component Value Date  ? CREATININE 0.81 05/05/2021  ? BUN 10 05/05/2021  ? GFR 72.84 05/05/2021  ? GFRNONAA >60 11/17/2016  ? GFRAA >60 11/17/2016  ? NA 141 05/05/2021  ? K 4.7 05/05/2021  ? CALCIUM 9.0 05/05/2021  ? CO2 33 (H) 05/05/2021  ? GLUCOSE 88 05/05/2021  ? ? ?Lab Results  ?Component Value Date/Time  ? GFR 72.84 05/05/2021 09:41 AM  ? GFR 67.34  04/29/2020 02:29 PM  ?  ?Last diabetic Eye exam: No results found for: HMDIABEYEEXA  ?Last diabetic Foot exam: No results found for: HMDIABFOOTEX  ? ?Lab Results  ?Component Value  Date  ? CHOL 163 05/05/2021  ? HDL 66.10 05/05/2021  ? [place] 77 05/05/2021  ? LDLDIRECT 135.0 04/29/2020  ? TRIG 100.0 05/05/2021  ? CHOLHDL 2 05/05/2021  ? ? ? ?  Latest Ref Rng & Units 05/05/2021  ?  9:41 AM 04/29/2020  ?  2:29 PM 04/16/2019  ? 10:48 AM  ?Hepatic Function  ?Total Protein 6.0 - 8.3 g/dL 6.1   6.4   6.5    ?Albumin 3.5 - 5.2 g/dL 4.0   3.8   3.9    ?AST 0 - 37 U/L '15   13   14    ' ?ALT 0 - 35 U/L '12   8   9    ' ?Alk Phosphatase 39 - 117 U/L 71   68   76    ?Total Bilirubin 0.2 - 1.2 mg/dL 0.6   0.4   0.5    ? ? ?Lab Results  ?Component Value Date/Time  ? TSH 3.30 05/05/2021 09:41 AM  ? TSH 4.56 (H) 07/31/2020 02:47 PM  ? FREET4 0.63 06/22/2020 03:59 PM  ? ? ? ?  Latest Ref Rng & Units 05/05/2021  ?  9:41 AM 04/29/2020  ?  2:29 PM 04/16/2019  ? 10:48 AM  ?CBC  ?WBC 4.0 - 10.5 K/uL 7.2   8.1   7.1    ?Hemoglobin 12.0 - 15.0 g/dL 14.0   14.7   15.1    ?Hematocrit 36.0 - 46.0 % 43.1   44.1   45.1    ?Platelets 150.0 - 400.0 K/uL 222.0   232.0   226.0    ? ? ?No results found for: VD25OH ? ?Clinical ASCVD: Yes  ?The 10-year ASCVD risk score (Arnett DK, et al., 2019) is: 17.3% ?  Values used to calculate the score: ?    Age: 71 years ?    Sex: Female ?    Is Non-Hispanic African American: No ?    Diabetic: No ?    Tobacco smoker: Yes ?    Systolic Blood Pressure: 673 mmHg ?    Is BP treated: Yes ?    HDL Cholesterol: 66.1 mg/dL ?    Total Cholesterol: 163 mg/dL   ? ? ?  12/29/2020  ?  3:15 PM 04/16/2019  ? 10:59 AM 04/16/2019  ? 10:45 AM  ?Depression screen PHQ 2/9  ?Decreased Interest 0 0 0  ?Down, Depressed, Hopeless 0 0 0  ?PHQ - 2 Score 0 0 0  ?Altered sleeping  0 0  ?Tired, decreased energy  0 0  ?Change in appetite  0 0  ?Feeling bad or failure about yourself   0 0  ?Trouble concentrating  0 0  ?Moving slowly or fidgety/restless  0 0  ?Suicidal thoughts  0 0  ?PHQ-9 Score  0 0  ?Difficult doing work/chores  Not difficult at all Not difficult at all  ?  ? ?Social History  ? ?Tobacco Use  ?Smoking Status Every  Day  ? Packs/day: 0.25  ? Types: Cigarettes  ?Smokeless Tobacco Never  ? ?BP Readings from Last 3 Encounters:  ?05/18/21 118/72  ?05/05/21 128/84  ?04/13/21 128/60  ? ?Pulse Readings from Last 3 Encounters:  ?05/18/21 72  ?05/05/21 63  ?04/13/21 63  ? ?Wt Readings from Last 3 Encounters:  ?05/18/21  138 lb 2 oz (62.7 kg)  ?05/05/21 139 lb (63 kg)  ?04/13/21 135 lb 8 oz (61.5 kg)  ? ?BMI Readings from Last 3 Encounters:  ?05/18/21 25.26 kg/m?  ?05/05/21 25.42 kg/m?  ?04/13/21 24.78 kg/m?  ? ? ?Assessment/Interventions: Review of patient past medical history, allergies, medications, health status, including review of consultants reports, laboratory and other test data, was performed as part of comprehensive evaluation and provision of chronic care management services.  ? ?SDOH:  (Social Determinants of Health) assessments and interventions performed: Yes ?SDOH Interventions   ? ?Flowsheet Row Most Recent Value  ?SDOH Interventions   ?Food Insecurity Interventions Intervention Not Indicated  ?Financial Strain Interventions Intervention Not Indicated  ? ?  ? ?SDOH Screenings  ? ?Alcohol Screen: Not on file  ?Depression (PHQ2-9): Low Risk   ? PHQ-2 Score: 0  ?Financial Resource Strain: Low Risk   ? Difficulty of Paying Living Expenses: Not very hard  ?Food Insecurity: No Food Insecurity  ? Worried About Charity fundraiser in the Last Year: Never true  ? Ran Out of Food in the Last Year: Never true  ?Housing: Not on file  ?Physical Activity: Not on file  ?Social Connections: Not on file  ?Stress: Not on file  ?Tobacco Use: High Risk  ? Smoking Tobacco Use: Every Day  ? Smokeless Tobacco Use: Never  ? Passive Exposure: Not on file  ?Transportation Needs: Not on file  ? ? ?Wood ? ?Allergies  ?Allergen Reactions  ? Voltaren [Diclofenac Sodium] Anaphylaxis  ? Diclofenac Sodium Hives and Itching  ? Aricept [Donepezil Hcl] Itching  ?  Itchy, jittery (no hives)  ? Aspirin Other (See Comments)  ?  REACTION: GI upset  ?  Atorvastatin   ?  REACTION: pain  ? Simvastatin   ?  REACTION: muscle pain  ? ? ?Medications Reviewed Today   ? ? Reviewed by Charlton Haws, [facility] (Pharmacist) on 06/28/21 at 1426  Med List Status: <N

## 2021-06-28 NOTE — Patient Instructions (Addendum)
Visit Information ? ?Phone number for Pharmacist: [phone] ? ?Thank you for meeting with me to discuss your medications! I look forward to working with you to achieve your health care goals. Below is a summary of what we talked about during the visit: ? ? Goals Addressed   ?None ?  ? ? ?Care Plan : [place]  ?Updates made by Tammy Neal, [place] since 06/28/2021 12:00 AM  ?  ? ?Problem: Hypertension, Hyperlipidemia, and Anxiety, Mild cognitive impairment   ?Priority: High  ?  ? ?Long-Range Goal: Disease mgmt   ?Start Date: 06/28/2021  ?This Visit's Progress: On track  ?Priority: High  ?Note:   ?Current Barriers:  ?Trouble swallowing pills ? ?Pharmacist Clinical Goal(s):  ?Patient will adhere to plan to optimize therapeutic regimen for medication administration as evidenced by report of adherence to recommended medication management changes through collaboration with PharmD and provider.  ? ?Interventions: ?1:1 collaboration with Tammy Neal, Tammy Neal, Tammy Neal regarding development and update of comprehensive plan of care as evidenced by provider attestation and co-signature ?Inter-disciplinary care team collaboration (see longitudinal plan of care) ?Comprehensive medication review performed; medication list updated in electronic medical record ? ?Hypertension (BP goal <130/80) ?-Controlled - BP at goal in recent OV; pt has trouble swallowing sometimes, pill gets caught in throat ?-Current treatment: ?Amlodipine 5 mg daily - Appropriate, Effective, Safe, Accessible ?-Medications previously tried: n/a  ?-Educated on BP goals and benefits of medications for prevention of heart attack, stroke and kidney damage; ?-Counseled to monitor BP at home periodically ?-Reviewed dosage forms- amlodipine is available is a liquid formulation, or it can be crushed and put in soft food; pt opted to crush pills given logistic difficulty of preparing liquid doses ahead of time ?-Recommended to continue current medication; ok to  crush amlodipine and mix with soft food ? ?Hyperlipidemia: (LDL goal < 100) ?-Controlled - LDL 77 (04/2021) slightly above goal on maximum tolerated statin ?-Hx aortic atherosclerosis ?-Current treatment: ?Rosuvastatin 10 mg 2x weekly - Appropriate, Effective, Safe, Accessible ?-Medications previously tried: n/a  ?-Educated on Cholesterol goals; Benefits of statin for ASCVD risk reduction; ?-Reviewed dosage forms - rosuvastatin is available as tablet or capsule; capsules can be opened and contects sprinkled over soft food, however capsules are not preferred by insurance and present a cost barrier; tablets are coated but can still be crushed but taste bitter, mixing with soft food can mitigate unpleasant taste ?-Recommended to continue current medication - ok to crush tablets and mix with soft food ? ?Depression/Anxiety (Goal: manage symptoms) ?-Controlled - per pt and daughter report ?-PHQ9: 0 (12/2020) - no/minimal depression ?-GAD7: not on file ?-Connected with PCP for mental health support ?-Current treatment: ?Buspirone 15 mg - 1/3 tablet BID - Appropriate, Effective, Safe, Accessible ?-Medications previously tried/failed: n/a ?-Educated on Benefits of medication for symptom control ?-Reviewed dosage forms - buspirone is available only as tablet and is able to be crushed ?-Recommended to continue current medication - ok to crush tablets and mix with soft food ? ?Dementia (Goal: slow progression) ?-Controlled ?-Current treatment  ?Memantine 10 mg BID - Appropriate, Effective, Safe, Accessible ?-Medications previously tried: n/a ?-Reviewed dosage forms - memantine available as IR tablets, ER capsules and liquid solution; ER capsules are non-formulary and cost-prohibitive, and pt opts to crush IR tablets rather than deal with logistics of dosing the liquid ?-Recommended to continue current medication - ok to crush memantine tablets and mix with soft food ? ?Hypothyroidism (Goal: maintain TSH in goal  range) ?-  Controlled - pt daughter reports patient misses levothyroxine dose 2-3 x per week due to logistical issues with waking patient early, they would prefer to be able to take levothyroxine with other meds ?-TSH was 3.3 in 04/2021, at goal ?-Current treatment  ?Levothyroxine 50 mcg daily - Appropriate, Effective, Safe, Accessible ?-Medications previously tried: n/a ?-Reviewed dosage forms - levothyroxine is available as tablet and liquid;  pt prefers crushing tablets to switching to liquid ?-Discussed it would be reasonable to change timing of levothyroxine dose to take with other meds; since pt is already missing 2-3 does per week, the decreased absorption due to taking it with other meds will likely not be significant ?-Recommended to take levothyroxine with other meds; ok to crush tablets and mix with soft food as long as it is consumed immediately after crushing ? ?Health Maintenance ?-Vaccine gaps: Shingrix, PPSV23 or Prevnar20 ?-Current therapy:  ?Vitamin B12 1000 mcg ?Tylenol 650 mg ?Nystatin cream ?Benzonatate 200 mg ?-Patient is satisfied with current therapy and denies issues ?-Recommended to continue current medication ? ?Patient Goals/Self-Care Activities ?Patient will:  ?- take medications as prescribed as evidenced by patient report and record review ?focus on medication adherence by routine ?  ?  ? ?Tammy Neal was given information about Chronic Care Management services today including:  ?CCM service includes personalized support from designated clinical staff supervised by her physician, including individualized plan of care and coordination with other care providers ?24/7 contact phone numbers for assistance for urgent and routine care needs. ?Standard insurance, coinsurance, copays and deductibles apply for chronic care management only during months in which we provide at least 20 minutes of these services. Most insurances cover these services at 100%, however patients may be responsible for  any copay, coinsurance and/or deductible if applicable. This service may help you avoid the need for more expensive face-to-face services. ?Only one practitioner may furnish and bill the service in a calendar month. ?The patient may stop CCM services at any time (effective at the end of the month) by phone call to the office staff. ? ?Patient agreed to services and verbal consent obtained.  ? ?Patient verbalizes understanding of instructions and care plan provided today and agrees to view in [place]. Active MyChart status confirmed with patient.   ?The patient has been provided with contact information for the care management team and has been advised to call with any health related questions or concerns.  ? ?Tammy Neal, PharmD, BCACP ?Clinical Pharmacist ?[facility] ?[phone]  ?

## 2021-06-28 NOTE — Telephone Encounter (Signed)
Agree with that plan, thanks  ?TSH in 6-8 weeks ?Take with other medicines  ?

## 2021-06-28 NOTE — Telephone Encounter (Signed)
Patient's family reports pt is missing levothyroxine dose 2-3 times weekly due to logistics of separating it from other medications and waking patient up early to take it, family reports it would be a lot easier for them to just give everything together. ? ?Given she has been missing doses, it is probably OK to change levothyroxine timing to take it with other AM meds - the decreased absorption will likely be balanced out by improved adherence ? ?Consulting with PCP - would recommend repeat TSH in 6-8 weeks ? ?Lab Results  ?Component Value Date/Time  ? TSH 3.30 05/05/2021 09:41 AM  ? TSH 4.56 (H) 07/31/2020 02:47 PM  ? ? ?

## 2021-06-29 ENCOUNTER — Other Ambulatory Visit: Payer: Self-pay | Admitting: Family Medicine

## 2021-06-30 ENCOUNTER — Ambulatory Visit (HOSPITAL_COMMUNITY)
Admission: RE | Admit: 2021-06-30 | Discharge: 2021-06-30 | Disposition: A | Discharge status: Home / Self Care | Payer: Medicare HMO | Source: Ambulatory Visit | Attending: Family Medicine | Admitting: Family Medicine

## 2021-06-30 DIAGNOSIS — Z87891 Personal history of nicotine dependence: Secondary | ICD-10-CM | POA: Insufficient documentation

## 2021-06-30 DIAGNOSIS — R1310 Dysphagia, unspecified: Secondary | ICD-10-CM | POA: Insufficient documentation

## 2021-06-30 DIAGNOSIS — Z0389 Encounter for observation for other suspected diseases and conditions ruled out: Secondary | ICD-10-CM | POA: Diagnosis not present

## 2021-06-30 DIAGNOSIS — J449 Chronic obstructive pulmonary disease, unspecified: Secondary | ICD-10-CM | POA: Insufficient documentation

## 2021-06-30 DIAGNOSIS — M1990 Unspecified osteoarthritis, unspecified site: Secondary | ICD-10-CM | POA: Diagnosis not present

## 2021-06-30 NOTE — Telephone Encounter (Signed)
Called and spoke with patient and daughter made an appt for her come in for labs 09/05/21 @ 3 pm  ?

## 2021-06-30 NOTE — Therapy (Signed)
Modified Barium Swallow Progress Note ? ?Patient Details  ?Name: Tammy Neal ?MRN: [id number] ?Date of Birth: [birth year] ? ?Today's Date: 06/30/2021 ? ?Modified Barium Swallow completed.  Full report located under Chart Review in the Imaging Section. ? ?Brief recommendations include the following: ? ?Clinical Impression ? Patient presents with a normal oral phase of swallow and a pharyngeal phase which is mildly impaired. No incidence of penetration or aspiration with tested boluses of thin liquids, puree solids, regular solids and barium tablet. Swallow initiation was timely for all swallows. With puree and regular texture solids, patient exhibited trace to mild vallecular residuals (epiglottis was observed to be curled inwards) but residuals cleared with extra dry swallow and/or sip of liquid. Presence of ACDF hardware observed but does not appear to significantly impact bolus transit. Prominent cricopharyngeal bar observed (see image) but this also did not appear to impact bolus transit. Esophageal sweep did not reveal any s/s of barium stasis or dysmotility. SLP provided patient and daughter with handouts describing soft solids diets and also recommended they f/u with PCP as several of patient's complaints (globus sensation, trouble with spicy/mint flavors, burping) could be indicative of GERD. ?  ?Swallow Evaluation Recommendations ? ?   ? ? SLP Diet Recommendations: Regular solids;Thin liquid ? ? Liquid Administration via: Cup;Straw ? ? Medication Administration: Whole meds with liquid ? ? Supervision: Patient able to self feed ? ? Compensations: Slow rate;Small sips/bites;Follow solids with liquid ? ? Postural Changes: Seated upright at 90 degrees ? ? Oral Care Recommendations: Oral care BID ? ?   ? ?Sonia Baller, MA, CCC-SLP ?Speech Therapy ? ? ?

## 2021-07-05 ENCOUNTER — Encounter: Payer: Self-pay | Admitting: Family Medicine

## 2021-07-05 ENCOUNTER — Ambulatory Visit (INDEPENDENT_AMBULATORY_CARE_PROVIDER_SITE_OTHER): Payer: Medicare HMO | Admitting: Family Medicine

## 2021-07-05 VITALS — BP 118/74 | HR 67 | Ht 62.0 in | Wt 138.6 lb

## 2021-07-05 DIAGNOSIS — R1310 Dysphagia, unspecified: Secondary | ICD-10-CM

## 2021-07-05 DIAGNOSIS — G3184 Mild cognitive impairment, so stated: Secondary | ICD-10-CM | POA: Diagnosis not present

## 2021-07-05 MED ORDER — FAMOTIDINE 20 MG PO TABS: 20.0000 mg | ORAL_TABLET | Freq: Every day | ORAL | 3 refills | Status: DC

## 2021-07-05 NOTE — Assessment & Plan Note (Signed)
May stem from pill anxiety , does fairly well eating  ?Nl swallowing study  ?Cognitive decline noted  ?No c/o pills getting stuck or esoph c/o but occ globus sensation  ?Px pepcid ?If worse consider EGD ?inst to continue crushing pills for now  ?Will follow carefully ?

## 2021-07-05 NOTE — Patient Instructions (Signed)
Keep crushing pills  ? ?Try pepcid 20 mg daily  ?In the am before eating  ? ?Chew well  ?Cut food small if needed ? ?If symptoms worsen please let ms know ? ? ?

## 2021-07-05 NOTE — Progress Notes (Signed)
? ?Subjective:  ? ? Patient ID: Tammy Neal, female    DOB: 1950/09/17, 71 y.o.   MRN: [id number] ? ?HPI ?Pt presents for f/u of swallowing problems  ? ?Wt Readings from Last 3 Encounters:  ?07/05/21 138 lb 9 oz (62.9 kg)  ?05/18/21 138 lb 2 oz (62.7 kg)  ?05/05/21 139 lb (63 kg)  ? ?25.34 kg/m? ? ? ?Has to crush medications to take them ? ?Had a swallowing test-normal and reassuring  ?Noted ACDF hardware -bolus able ot clear  ? ?No h/o GERD in past  ?Occ has a globus sensation  ?Family is aware  ?She clears her throat or coughs ?Little phlegm at times  ? ?Unsure if she feels like food gets stuck  ?Feels like it goes down the wrong way  ? ?She also gets anxious that she will choke when she takes medicine  ? ?Crushes meds with applesauce  ?Does not c/o about that  ? ?Foods ?Steak can be a problem / family cuts it up well  ?No problems with dry food or bread  ?No c/o dry mough ? ?Cognitive impairment  ? ?On namenda  ? ? ?Hypothyroidism  ?Pt has no clinical changes ?No change in energy level/ hair or skin/ edema and no tremor ?Lab Results  ?Component Value Date  ? TSH 3.30 05/05/2021  ?   ?Patient Active Problem List  ? Diagnosis Date Noted  ? Pill dysphagia 07/05/2021  ? Dysphagia 06/09/2021  ? Epidermal cyst of face 04/13/2021  ? Right hip pain 12/29/2020  ? Hypothyroid 06/23/2020  ? Essential hypertension 04/29/2020  ? Spondylosis without myelopathy or radiculopathy, cervical region 02/28/2020  ? Low vitamin B12 level 03/20/2018  ? Anxiety disorder 03/20/2018  ? Aortic atherosclerosis ([place]) 11/06/2016  ? Pre-op examination 11/04/2016  ? Arthritis of left hip 07/13/2016  ? Estrogen deficiency 12/29/2015  ? Cough 04/30/2015  ? Mild cognitive impairment 01/02/2015  ? Intertrigo 11/02/2011  ? Routine general medical examination at a health care facility 10/26/2011  ? Gynecological examination 07/05/2010  ? DEPRESSION 05/03/2010  ? Neck pain 06/10/2008  ? Hyperlipidemia 02/26/2007  ? Former smoker 02/26/2007  ?  PSORIASIS 02/26/2007  ? ?Past Medical History:  ?Diagnosis Date  ? Allergy   ? allergic rhinitis  ? Arthritis   ? COPD (chronic obstructive pulmonary disease) ([place])   ? Hyperlipidemia   ? no meds  ? Neck pain, chronic   ? with fusion  ? Osteoporosis   ? Psoriasis   ? ?Past Surgical History:  ?Procedure Laterality Date  ? ABDOMINAL HYSTERECTOMY    ? endometriosis 1 ovary left  ? APPENDECTOMY    ? NECK SURGERY    ? [place]  ? cervical fusion Dr Carloyn Manner x2  ? 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  ? Procedure: LEFT TOTAL HIP ARTHROPLASTY ANTERIOR APPROACH;  Surgeon: Gaynelle Arabian, MD;  Location: [facility];  Service: Orthopedics;  Laterality: Left;  ? ?Social History  ? ?Tobacco Use  ? Smoking status: Former  ?  Years: 1.00  ?  Types: Cigarettes  ? Smokeless tobacco: Never  ?Vaping Use  ? Vaping Use: Never used  ?Substance Use Topics  ? Alcohol use: No  ?  Alcohol/week: 0.0 standard drinks  ? Drug use: No  ? ?Family History  ?Problem Relation Age of Onset  ? Hypertension Mother   ? Diabetes Mother   ? Stroke Mother 79  ? Asthma Father   ? COPD Father   ? Colon cancer  Neg Hx   ? Esophageal cancer Neg Hx   ? Ulcerative colitis Neg Hx   ? Stomach cancer Neg Hx   ? ?Allergies  ?Allergen Reactions  ? Voltaren [Diclofenac Sodium] Anaphylaxis  ? Diclofenac Sodium Hives and Itching  ? Aricept [Donepezil Hcl] Itching  ?  Itchy, jittery (no hives)  ? Aspirin Other (See Comments)  ?  REACTION: GI upset  ? Atorvastatin   ?  REACTION: pain  ? Simvastatin   ?  REACTION: muscle pain  ? ?Current Outpatient Medications on File Prior to Visit  ?Medication Sig Dispense Refill  ? acetaminophen (TYLENOL) 650 MG CR tablet Take 650 mg by mouth 2 (two) times daily as needed for pain.     ? amLODipine (NORVASC) 5 MG tablet Take 1 tablet (5 mg total) by mouth daily. 90 tablet 3  ? benzonatate (TESSALON) 200 MG capsule Take 1 capsule (200 mg total) by mouth 3 (three) times daily as needed. 30 capsule 3  ? busPIRone (BUSPAR) 15 MG tablet  TAKE 1/4 TABLET BY MOUTH TWICE A DAY 45 tablet 1  ? levothyroxine (SYNTHROID) 50 MCG tablet TAKE 1 TABLET BY MOUTH ONCE A DAY BEFOREBREAKFAST. 90 tablet 1  ? memantine (NAMENDA) 10 MG tablet Take 1 tablet (10 mg total) by mouth 2 (two) times daily. 180 tablet 1  ? nystatin cream (MYCOSTATIN) Apply 1 application. topically 2 (two) times daily as needed for dry skin (yeast rash). 30 g 3  ? rosuvastatin (CRESTOR) 10 MG tablet TAKE 1 TABLET BY MOUTH TWICE WEEKLY 24 tablet 3  ? vitamin B-12 (CYANOCOBALAMIN) 1000 MCG tablet Take 1,000 mcg by mouth daily.    ? ?No current facility-administered medications on file prior to visit.  ?  ?Review of Systems  ?Constitutional:  Negative for activity change, appetite change, fatigue, fever and unexpected weight change.  ?HENT:  Positive for trouble swallowing. Negative for congestion, ear pain, rhinorrhea, sinus pressure, sore throat and voice change.   ?Eyes:  Negative for pain, redness and visual disturbance.  ?Respiratory:  Negative for cough, shortness of breath and wheezing.   ?Cardiovascular:  Negative for chest pain and palpitations.  ?Gastrointestinal:  Negative for abdominal pain, blood in stool, constipation and diarrhea.  ?Endocrine: Negative for polydipsia and polyuria.  ?Genitourinary:  Negative for dysuria, frequency and urgency.  ?Musculoskeletal:  Negative for arthralgias, back pain and myalgias.  ?Skin:  Negative for pallor and rash.  ?Allergic/Immunologic: Negative for environmental allergies.  ?Neurological:  Negative for dizziness, syncope and headaches.  ?Hematological:  Negative for adenopathy. Does not bruise/bleed easily.  ?Psychiatric/Behavioral:  Positive for decreased concentration. Negative for dysphoric mood. The patient is not nervous/anxious.   ? ?   ?Objective:  ? Physical Exam ?Constitutional:   ?   General: She is not in acute distress. ?   Appearance: Normal appearance. She is normal weight. She is not ill-appearing or diaphoretic.  ?HENT:  ?    Head: Normocephalic and atraumatic.  ?   Mouth/Throat:  ?   Mouth: Mucous membranes are moist.  ?   Pharynx: Oropharynx is clear. No oropharyngeal exudate or posterior oropharyngeal erythema.  ?Eyes:  ?   General: No scleral icterus. ?   Conjunctiva/sclera: Conjunctivae normal.  ?   Pupils: Pupils are equal, round, and reactive to light.  ?Neck:  ?   Vascular: No carotid bruit.  ?Cardiovascular:  ?   Rate and Rhythm: Normal rate and regular rhythm.  ?   Heart sounds: Normal heart sounds.  ?  Pulmonary:  ?   Effort: Pulmonary effort is normal. No respiratory distress.  ?   Breath sounds: Normal breath sounds. No wheezing.  ?Musculoskeletal:  ?   Cervical back: Neck supple. No rigidity or tenderness.  ?Lymphadenopathy:  ?   Cervical: No cervical adenopathy.  ?Skin: ?   General: Skin is warm and dry.  ?   Coloration: Skin is not jaundiced or pale.  ?   Findings: No erythema.  ?Neurological:  ?   Mental Status: She is alert.  ?   Cranial Nerves: No cranial nerve deficit.  ?   Motor: No weakness.  ?   Coordination: Coordination normal.  ?   Deep Tendon Reflexes: Reflexes normal.  ?Psychiatric:     ?   Mood and Affect: Mood normal.  ? ? ? ? ? ?   ?Assessment & Plan:  ? ?Problem List Items Addressed This Visit   ? ?  ? Digestive  ? Dysphagia - Primary  ?  May stem from pill anxiety , does fairly well eating  ?Nl swallowing study  ?Cognitive decline noted  ?No c/o pills getting stuck or esoph c/o but occ globus sensation  ?Px pepcid ?If worse consider EGD ?inst to continue crushing pills for now  ?Will follow carefully ? ?  ?  ?  ? Nervous and Auditory  ? Mild cognitive impairment  ?  ? Other  ? Pill dysphagia  ?  Continue crushing pills/use apple sauce  ? ?  ?  ? ? ?

## 2021-07-05 NOTE — Assessment & Plan Note (Signed)
Continue crushing pills/use apple sauce  ?

## 2021-07-21 DIAGNOSIS — I10 Essential (primary) hypertension: Secondary | ICD-10-CM | POA: Diagnosis not present

## 2021-07-21 DIAGNOSIS — E039 Hypothyroidism, unspecified: Secondary | ICD-10-CM

## 2021-07-21 DIAGNOSIS — F32A Depression, unspecified: Secondary | ICD-10-CM | POA: Diagnosis not present

## 2021-07-21 DIAGNOSIS — F0390 Unspecified dementia without behavioral disturbance: Secondary | ICD-10-CM

## 2021-07-21 DIAGNOSIS — E785 Hyperlipidemia, unspecified: Secondary | ICD-10-CM | POA: Diagnosis not present

## 2021-07-21 DIAGNOSIS — F17210 Nicotine dependence, cigarettes, uncomplicated: Secondary | ICD-10-CM

## 2021-07-27 ENCOUNTER — Other Ambulatory Visit: Payer: Self-pay | Admitting: Family Medicine

## 2021-07-27 NOTE — Progress Notes (Addendum)
Anesthesia Review:  PCP: DR Loura Pardon- clearance 05/07/21  on chart  Cardiologist : NONE  Chest x-ray : 05/07/21  EKG : 05/05/21 on chart and in epic  Echo : Stress test: Cardiac Cath :  Activity level:  can do a flight of stairs without difficulty  Sleep Study/ CPAP : none  Fasting Blood Sugar :      / Checks Blood Sugar -- times a day:   Blood Thinner/ Instructions /Last Dose: ASA / Instructions/ Last Dose :   PT has some memory issues.   Daughter - Zenovia Jordan has POA- to bring DOS.  One of daughters needs to be with pt in short stay to help answer some questions.

## 2021-07-28 NOTE — Progress Notes (Signed)
DUE TO COVID-19 ONLY  2  VISITOR IS ALLOWED TO COME WITH YOU AND STAY IN THE WAITING ROOM ONLY DURING PRE OP AND PROCEDURE DAY OF SURGERY.   4  VISITOR  MAY VISIT WITH YOU AFTER SURGERY IN YOUR PRIVATE ROOM DURING VISITING HOURS ONLY! YOU MAY HAVE ONE PERSON SPEND THE NITE WITH YOU IN YOUR ROOM AFTER SURGERY.        Your procedure is scheduled on:          08/11/21   Report to [facility] Main  Entrance   Report to admitting at       1145           AM DO NOT BRING INSURANCE CARD, PICTURE ID OR WALLET DAY OF SURGERY.      Call this number if you have problems the morning of surgery [phone]    REMEMBER: NO  SOLID FOODS , CANDY, GUM OR MINTS AFTER [place] .       Marland Kitchen CLEAR LIQUIDS UNTIL       1130am          DAY OF SURGERY.      PLEASE FINISH ENSURE DRINK PER SURGEON ORDER  WHICH NEEDS TO BE COMPLETED AT    1130am       MORNING OF SURGERY.       CLEAR LIQUID DIET   Foods Allowed      WATER BLACK COFFEE ( SUGAR OK, NO MILK, CREAM OR CREAMER) REGULAR AND DECAF  TEA ( SUGAR OK NO MILK, CREAM, OR CREAMER) REGULAR AND DECAF  PLAIN JELLO ( NO RED)  FRUIT ICES ( NO RED, NO FRUIT PULP)  POPSICLES ( NO RED)  JUICE- APPLE, WHITE GRAPE AND WHITE CRANBERRY  SPORT DRINK LIKE GATORADE ( NO RED)  CLEAR BROTH ( VEGETABLE , CHICKEN OR BEEF)                                                                     BRUSH YOUR TEETH MORNING OF SURGERY AND RINSE YOUR MOUTH OUT, NO CHEWING GUM CANDY OR MINTS.     Take these medicines the morning of surgery with A SIP OF WATER:  buspar, pepcid, synthroid, namenda    DO NOT TAKE ANY DIABETIC MEDICATIONS DAY OF YOUR SURGERY                               You may not have any metal on your body including hair pins and              piercings  Do not wear jewelry, make-up, lotions, powders or perfumes, deodorant             Do not wear nail polish on your fingernails.              IF YOU ARE A FEMALE AND WANT TO SHAVE  UNDER ARMS OR LEGS PRIOR TO SURGERY YOU MUST DO SO AT LEAST 48 HOURS PRIOR TO SURGERY.              Men may shave face and neck.   Do not bring valuables to the hospital. [facility] IS NOT  RESPONSIBLE   FOR VALUABLES.  Contacts, dentures or bridgework may not be worn into surgery.  Leave suitcase in the car. After surgery it may be brought to your room.     Patients discharged the day of surgery will not be allowed to drive home. IF YOU ARE HAVING SURGERY AND GOING HOME THE SAME DAY, YOU MUST HAVE AN ADULT TO DRIVE YOU HOME AND BE WITH YOU FOR 24 HOURS. YOU MAY GO HOME BY TAXI OR UBER OR ORTHERWISE, BUT AN ADULT MUST ACCOMPANY YOU HOME AND STAY WITH YOU FOR 24 HOURS.                Please read over the following fact sheets you were given: _____________________________________________________________________  [facility] - Preparing for Surgery Before surgery, you can play an important role.  Because skin is not sterile, your skin needs to be as free of germs as possible.  You can reduce the number of germs on your skin by washing with CHG (chlorahexidine gluconate) soap before surgery.  CHG is an antiseptic cleaner which kills germs and bonds with the skin to continue killing germs even after washing. Please DO NOT use if you have an allergy to CHG or antibacterial soaps.  If your skin becomes reddened/irritated stop using the CHG and inform your nurse when you arrive at Short Stay. Do not shave (including legs and underarms) for at least 48 hours prior to the first CHG shower.  You may shave your face/neck. Please follow these instructions carefully:  1.  Shower with CHG Soap the night before surgery and the  morning of Surgery.  2.  If you choose to wash your hair, wash your hair first as usual with your  normal  shampoo.  3.  After you shampoo, rinse your hair and body thoroughly to remove the  shampoo.                           4.  Use CHG as you would any other liquid soap.   You can apply chg directly  to the skin and wash                       Gently with a scrungie or clean washcloth.  5.  Apply the CHG Soap to your body ONLY FROM THE NECK DOWN.   Do not use on face/ open                           Wound or open sores. Avoid contact with eyes, ears mouth and genitals (private parts).                       Wash face,  Genitals (private parts) with your normal soap.             6.  Wash thoroughly, paying special attention to the area where your surgery  will be performed.  7.  Thoroughly rinse your body with warm water from the neck down.  8.  DO NOT shower/wash with your normal soap after using and rinsing off  the CHG Soap.                9.  Pat yourself dry with a clean towel.            10.  Wear clean pajamas.  11.  Place clean sheets on your bed the night of your first shower and do not  sleep with pets. Day of Surgery : Do not apply any lotions/deodorants the morning of surgery.  Please wear clean clothes to the hospital/surgery center.  FAILURE TO FOLLOW THESE INSTRUCTIONS MAY RESULT IN THE CANCELLATION OF YOUR SURGERY PATIENT SIGNATURE_________________________________  NURSE SIGNATURE__________________________________  ________________________________________________________________________

## 2021-07-30 ENCOUNTER — Encounter (HOSPITAL_COMMUNITY)
Admission: RE | Admit: 2021-07-30 | Discharge: 2021-07-30 | Disposition: A | Discharge status: Home / Self Care | Payer: Medicare HMO | Source: Ambulatory Visit | Attending: Orthopedic Surgery | Admitting: Orthopedic Surgery

## 2021-07-30 ENCOUNTER — Other Ambulatory Visit: Payer: Self-pay

## 2021-07-30 ENCOUNTER — Encounter (HOSPITAL_COMMUNITY): Payer: Self-pay

## 2021-07-30 VITALS — BP 147/62 | HR 66 | Temp 98.0°F | Resp 16 | Ht 62.0 in | Wt 135.0 lb

## 2021-07-30 DIAGNOSIS — Z01818 Encounter for other preprocedural examination: Secondary | ICD-10-CM

## 2021-07-30 DIAGNOSIS — Z01812 Encounter for preprocedural laboratory examination: Secondary | ICD-10-CM | POA: Insufficient documentation

## 2021-07-30 HISTORY — DX: Gastro-esophageal reflux disease without esophagitis: K21.9

## 2021-07-30 HISTORY — DX: Anxiety disorder, unspecified: F41.9

## 2021-07-30 HISTORY — DX: Hypothyroidism, unspecified: E03.9

## 2021-07-30 HISTORY — DX: Essential (primary) hypertension: I10

## 2021-07-30 LAB — BASIC METABOLIC PANEL WITH GFR
Anion gap: 8 (ref 5–15)
BUN: 17 mg/dL (ref 8–23)
CO2: 27 mmol/L (ref 22–32)
Calcium: 9.4 mg/dL (ref 8.9–10.3)
Chloride: 108 mmol/L (ref 98–111)
Creatinine, Ser: 0.89 mg/dL (ref 0.44–1.00)
GFR, Estimated: >60 mL/min
Glucose, Bld: 97 mg/dL (ref 70–99)
Potassium: 4 mmol/L (ref 3.5–5.1)
Sodium: 143 mmol/L (ref 135–145)

## 2021-07-30 LAB — TYPE AND SCREEN
ABO/RH(D): O POS
Antibody Screen: NEGATIVE

## 2021-07-30 LAB — CBC
HCT: 45.7 % (ref 36.0–46.0)
Hemoglobin: 14.7 g/dL (ref 12.0–15.0)
MCH: 31.6 pg (ref 26.0–34.0)
MCHC: 32.2 g/dL (ref 30.0–36.0)
MCV: 98.3 fL (ref 80.0–100.0)
Platelets: 247 10*3/uL (ref 150–400)
RBC: 4.65 MIL/uL (ref 3.87–5.11)
RDW: 12.9 % (ref 11.5–15.5)
WBC: 6.6 10*3/uL (ref 4.0–10.5)
nRBC: 0 % (ref 0.0–0.2)

## 2021-07-30 LAB — SURGICAL PCR SCREEN
MRSA, PCR: NEGATIVE
Staphylococcus aureus: NEGATIVE

## 2021-08-03 NOTE — H&P (Signed)
TOTAL HIP ADMISSION H&P  Patient is admitted for right total hip arthroplasty.  Subjective:  Chief Complaint: Right hip pain  HPI: [name], 71 y.o. female, has a history of pain and functional disability in the right hip due to arthritis and patient has failed non-surgical conservative treatments for greater than 12 weeks to include NSAID's and/or analgesics, corticosteriod injections, and activity modification. Onset of symptoms was gradual, starting  several  years ago with gradually worsening course since that time. The patient noted no past surgery on the right hip. Patient currently rates pain in the right hip at 8 out of 10 with activity. Patient has night pain, worsening of pain with activity and weight bearing, and pain that interfers with activities of daily living. Patient has evidence of  bone-on-bone arthritis  by imaging studies. This condition presents safety issues increasing the risk of falls. There is no current active infection.  Patient Active Problem List   Diagnosis Date Noted   Pill dysphagia 07/05/2021   Dysphagia 06/09/2021   Epidermal cyst of face 04/13/2021   Right hip pain 12/29/2020   Hypothyroid 06/23/2020   Essential hypertension 04/29/2020   Spondylosis without myelopathy or radiculopathy, cervical region 02/28/2020   Low vitamin B12 level 03/20/2018   Anxiety disorder 03/20/2018   Aortic atherosclerosis ([place]) 11/06/2016   Pre-op examination 11/04/2016   Arthritis of left hip 07/13/2016   Estrogen deficiency 12/29/2015   Cough 04/30/2015   Mild cognitive impairment 01/02/2015   Intertrigo 11/02/2011   Routine general medical examination at a health care facility 10/26/2011   Gynecological examination 07/05/2010   DEPRESSION 05/03/2010   Neck pain 06/10/2008   Hyperlipidemia 02/26/2007   Former smoker 02/26/2007   PSORIASIS 02/26/2007    Past Medical History:  Diagnosis Date   Allergy    allergic rhinitis   Anxiety    Arthritis    COPD  (chronic obstructive pulmonary disease) ([facility])    GERD (gastroesophageal reflux disease)    Hyperlipidemia    no meds   Hypertension    Hypothyroidism    Neck pain, chronic    with fusion   Osteoporosis    Psoriasis     Past Surgical History:  Procedure Laterality Date   ABDOMINAL HYSTERECTOMY     endometriosis 1 ovary left   APPENDECTOMY     NECK SURGERY     SPINE SURGERY  1977   cervical fusion Dr Carloyn Manner x2   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   Procedure: LEFT TOTAL HIP ARTHROPLASTY ANTERIOR APPROACH;  Surgeon: Gaynelle Arabian, MD;  Location: [facility];  Service: Orthopedics;  Laterality: Left;    Prior to Admission medications   Medication Sig Start Date End Date Taking? Authorizing Provider  acetaminophen (TYLENOL) 160 MG/5ML liquid Take 480 mg by mouth every 4 (four) hours as needed for pain.   Yes [provider]  benzonatate (TESSALON) 200 MG capsule Take 1 capsule (200 mg total) by mouth 3 (three) times daily as needed. 05/05/21  Yes Tower, Wynelle Fanny, MD  busPIRone (BUSPAR) 15 MG tablet TAKE 1/4 TABLET BY MOUTH TWICE A DAY Patient taking differently: Take 5 mg by mouth in the morning and at bedtime. 06/29/21  Yes Tower, Wynelle Fanny, MD  Cyanocobalamin (VITAMIN B-12 PO) Take 3,000 mcg by mouth in the morning. Gummies   Yes [provider]  famotidine (PEPCID) 20 MG tablet Take 1 tablet (20 mg total) by mouth daily. 07/05/21  Yes Tower, Wynelle Fanny, MD  ibuprofen (ADVIL) 100  MG/5ML suspension Take 200-300 mg by mouth every 4 (four) hours as needed (pain.).   Yes [provider]  levothyroxine (SYNTHROID) 50 MCG tablet TAKE 1 TABLET BY MOUTH ONCE A DAY BEFOREBREAKFAST. 05/12/21  Yes Tower, Wynelle Fanny, MD  memantine (NAMENDA) 10 MG tablet Take 1 tablet (10 mg total) by mouth 2 (two) times daily. 05/07/21  Yes Tower, Wynelle Fanny, MD  nystatin cream (MYCOSTATIN) Apply 1 application. topically 2 (two) times daily as needed for dry skin (yeast rash). 05/05/21  Yes Tower, Wynelle Fanny, MD   rosuvastatin (CRESTOR) 10 MG tablet TAKE 1 TABLET BY MOUTH TWICE WEEKLY Patient taking differently: Take 10 mg by mouth 2 (two) times a week. Tuesdays & Saturdays 04/13/21  Yes Tower, Roque Lias A, MD  amLODipine (NORVASC) 5 MG tablet TAKE 1 TABLET BY MOUTH ONCE A DAY 07/29/21   Tower, Wynelle Fanny, MD    Allergies  Allergen Reactions   Voltaren [Diclofenac Sodium] Anaphylaxis, Hives and Itching   Aricept [Donepezil Hcl] Itching    Itchy, jittery (no hives)   Aspirin Other (See Comments)    REACTION: GI upset   Lipitor [Atorvastatin]     REACTION: pain   Zocor [Simvastatin]     REACTION: muscle pain    Social History   Socioeconomic History   Marital status: Divorced    Spouse name: Not on file   Number of children: Not on file   Years of education: Not on file   Highest education level: Not on file  Occupational History   Not on file  Tobacco Use   Smoking status: Former    Years: 1.00    Types: Cigarettes   Smokeless tobacco: Never  Vaping Use   Vaping Use: Never used  Substance and Sexual Activity   Alcohol use: No    Alcohol/week: 0.0 standard drinks of alcohol   Drug use: No   Sexual activity: Never  Other Topics Concern   Not on file  Social History Narrative   Not on file   Social Determinants of Health   Financial Resource Strain: Low Risk  (06/28/2021)   Overall Financial Resource Strain (CARDIA)    Difficulty of Paying Living Expenses: Not very hard  Food Insecurity: No Food Insecurity (06/28/2021)   Hunger Vital Sign    Worried About Running Out of Food in the Last Year: Never true    Ran Out of Food in the Last Year: Never true  Transportation Needs: Not on file  Physical Activity: Not on file  Stress: Not on file  Social Connections: Not on file  Intimate Partner Violence: Not on file    Tobacco Use: Medium Risk (07/30/2021)   Patient History    Smoking Tobacco Use: Former    Smokeless Tobacco Use: Never    Passive Exposure: Not on file   Social History    Substance and Sexual Activity  Alcohol Use No   Alcohol/week: 0.0 standard drinks of alcohol    Family History  Problem Relation Age of Onset   Hypertension Mother    Diabetes Mother    Stroke Mother 109   Asthma Father    COPD Father    Colon cancer Neg Hx    Esophageal cancer Neg Hx    Ulcerative colitis Neg Hx    Stomach cancer Neg Hx     Review of Systems  Constitutional:  Negative for chills and fever.  HENT: Negative.    Eyes: Negative.   Respiratory:  Negative for cough and  shortness of breath.   Cardiovascular:  Negative for chest pain and palpitations.  Gastrointestinal:  Negative for abdominal pain, constipation, diarrhea, nausea and vomiting.  Genitourinary:  Negative for dysuria, frequency and urgency.  Musculoskeletal:  Positive for joint pain.  Skin:  Negative for rash.   Objective:  Physical Exam: Well nourished and well developed.  General: Alert and oriented x3, cooperative and pleasant, no acute distress.  Head: normocephalic, atraumatic, neck supple.  Eyes: EOMI.  Abdomen: non-tender to palpation and soft, normoactive bowel sounds. Musculoskeletal: The patient has an antalgic gait pattern favoring the right side with a cane.   Right Hip Exam:  Range of motion: Flexion to 100 degrees, internal rotation to 10 degrees, external rotation to 10 degrees, and abduction to 20 degrees with pain on range of motion.  There is no tenderness over the greater trochanteric bursa.   Left Hip Exam:  Range of motion: Flexion to 120 degrees, internal rotation to 30 degrees, external rotation to 40 degrees, and abduction to 40 degrees without discomfort.  There is no tenderness over the greater trochanteric bursa.   Calves soft and nontender. Motor function intact in LE. Strength 5/5 LE bilaterally. Neuro: Distal pulses 2+. Sensation to light touch intact in LE.  Vital signs in last 24 hours:    Imaging Review Plain radiographs demonstrate moderate  degenerative joint disease of the right hip. The bone quality appears to be adequate for age and reported activity level.  Assessment/Plan:  End stage arthritis, right hip  The patient history, physical examination, clinical judgement of the provider and imaging studies are consistent with end stage degenerative joint disease of the right hip and total hip arthroplasty is deemed medically necessary. The treatment options including medical management, injection therapy, arthroscopy and arthroplasty were discussed at length. The risks and benefits of total hip arthroplasty were presented and reviewed. The risks due to aseptic loosening, infection, stiffness, dislocation/subluxation, thromboembolic complications and other imponderables were discussed. The patient acknowledged the explanation, agreed to proceed with the plan and consent was signed. Patient is being admitted for inpatient treatment for surgery, pain control, PT, OT, prophylactic antibiotics, VTE prophylaxis, progressive ambulation and ADLs and discharge planning.The patient is planning to be discharged  home .  Therapy Plans: HEP Disposition: Home with Daughters Planned DVT Prophylaxis: Xarelto 10 mg (intolerant to aspirin) DME Needed: None PCP: Loura Pardon, MD (clearance received) TXA: IV Allergies: aspirin (abdominal pain) Anesthesia Concerns: None BMI: 25.9 Last HgbA1c: not diabetic  Pharmacy: [facility]  - Patient was instructed on what medications to stop prior to surgery. - Follow-up visit in 2 weeks with Dr. Wynelle Link - Begin physical therapy following surgery - Pre-operative lab work as pre-surgical testing - Prescriptions will be provided in hospital at time of discharge  R. Jaynie Bream, PA-C Orthopedic Surgery [facility] [place]

## 2021-08-11 ENCOUNTER — Encounter (HOSPITAL_COMMUNITY): Payer: Self-pay | Admitting: Orthopedic Surgery

## 2021-08-11 ENCOUNTER — Ambulatory Visit (HOSPITAL_BASED_OUTPATIENT_CLINIC_OR_DEPARTMENT_OTHER): Payer: Medicare HMO | Admitting: Certified Registered"

## 2021-08-11 ENCOUNTER — Ambulatory Visit (HOSPITAL_COMMUNITY): Payer: Medicare HMO

## 2021-08-11 ENCOUNTER — Ambulatory Visit (HOSPITAL_COMMUNITY): Payer: Medicare HMO | Admitting: Physician Assistant

## 2021-08-11 ENCOUNTER — Encounter (HOSPITAL_COMMUNITY)
Admission: RE | Disposition: A | Discharge status: Home / Self Care | Payer: Self-pay | Source: Home / Self Care | Attending: Orthopedic Surgery

## 2021-08-11 ENCOUNTER — Other Ambulatory Visit: Payer: Self-pay

## 2021-08-11 ENCOUNTER — Observation Stay (HOSPITAL_COMMUNITY): Payer: Medicare HMO

## 2021-08-11 ENCOUNTER — Observation Stay (HOSPITAL_COMMUNITY)
Admission: RE | Admit: 2021-08-11 | Discharge: 2021-08-13 | Disposition: A | Discharge status: Home / Self Care | Payer: Medicare HMO | Attending: Orthopedic Surgery | Admitting: Orthopedic Surgery

## 2021-08-11 DIAGNOSIS — E039 Hypothyroidism, unspecified: Secondary | ICD-10-CM | POA: Diagnosis not present

## 2021-08-11 DIAGNOSIS — I10 Essential (primary) hypertension: Secondary | ICD-10-CM | POA: Insufficient documentation

## 2021-08-11 DIAGNOSIS — Z87891 Personal history of nicotine dependence: Secondary | ICD-10-CM | POA: Diagnosis not present

## 2021-08-11 DIAGNOSIS — Z471 Aftercare following joint replacement surgery: Secondary | ICD-10-CM | POA: Diagnosis not present

## 2021-08-11 DIAGNOSIS — F418 Other specified anxiety disorders: Secondary | ICD-10-CM

## 2021-08-11 DIAGNOSIS — J449 Chronic obstructive pulmonary disease, unspecified: Secondary | ICD-10-CM | POA: Diagnosis not present

## 2021-08-11 DIAGNOSIS — Z79899 Other long term (current) drug therapy: Secondary | ICD-10-CM | POA: Insufficient documentation

## 2021-08-11 DIAGNOSIS — Z96643 Presence of artificial hip joint, bilateral: Secondary | ICD-10-CM | POA: Diagnosis not present

## 2021-08-11 DIAGNOSIS — Z96642 Presence of left artificial hip joint: Secondary | ICD-10-CM | POA: Diagnosis not present

## 2021-08-11 DIAGNOSIS — M169 Osteoarthritis of hip, unspecified: Secondary | ICD-10-CM | POA: Diagnosis present

## 2021-08-11 DIAGNOSIS — Z01818 Encounter for other preprocedural examination: Secondary | ICD-10-CM

## 2021-08-11 DIAGNOSIS — M1611 Unilateral primary osteoarthritis, right hip: Secondary | ICD-10-CM

## 2021-08-11 DIAGNOSIS — Z96641 Presence of right artificial hip joint: Secondary | ICD-10-CM | POA: Diagnosis not present

## 2021-08-11 HISTORY — PX: TOTAL HIP ARTHROPLASTY: SHX124

## 2021-08-11 SURGERY — ARTHROPLASTY, HIP, TOTAL, ANTERIOR APPROACH
Anesthesia: Spinal | Site: Hip | Laterality: Right

## 2021-08-11 MED ORDER — BUSPIRONE HCL 5 MG PO TABS
5.0000 mg | ORAL_TABLET | Freq: Two times a day (BID) | ORAL | Status: DC
  Administered 2021-08-12 – 2021-08-13 (×3): 5 mg via ORAL
  Filled 2021-08-11 (×3): qty 1

## 2021-08-11 MED ORDER — LACTATED RINGERS IV SOLN: INTRAVENOUS | Status: DC

## 2021-08-11 MED ORDER — METOCLOPRAMIDE HCL 5 MG/ML IJ SOLN
5.0000 mg | Freq: Three times a day (TID) | INTRAMUSCULAR | Status: DC | PRN
  Administered 2021-08-11: 5 mg via INTRAVENOUS
  Filled 2021-08-11: qty 2

## 2021-08-11 MED ORDER — FLEET ENEMA 7-19 GM/118ML RE ENEM: 1.0000 | ENEMA | Freq: Once | RECTAL | Status: DC | PRN

## 2021-08-11 MED ORDER — CEFAZOLIN SODIUM-DEXTROSE 2-4 GM/100ML-% IV SOLN
2.0000 g | INTRAVENOUS | Status: AC
  Administered 2021-08-11: 2 g via INTRAVENOUS
  Filled 2021-08-11: qty 100

## 2021-08-11 MED ORDER — TRAMADOL HCL 50 MG PO TABS
50.0000 mg | ORAL_TABLET | Freq: Four times a day (QID) | ORAL | Status: DC | PRN
  Administered 2021-08-11 – 2021-08-12 (×3): 50 mg via ORAL
  Filled 2021-08-11 (×3): qty 1

## 2021-08-11 MED ORDER — 0.9 % SODIUM CHLORIDE (POUR BTL) OPTIME
TOPICAL | Status: DC | PRN
  Administered 2021-08-11: 1000 mL

## 2021-08-11 MED ORDER — FENTANYL CITRATE (PF) 100 MCG/2ML IJ SOLN
INTRAMUSCULAR | Status: AC
  Filled 2021-08-11: qty 2

## 2021-08-11 MED ORDER — MENTHOL 3 MG MT LOZG: 1.0000 | LOZENGE | OROMUCOSAL | Status: DC | PRN

## 2021-08-11 MED ORDER — POVIDONE-IODINE 10 % EX SWAB
2.0000 | Freq: Once | CUTANEOUS | Status: AC
  Administered 2021-08-11: 2 via TOPICAL

## 2021-08-11 MED ORDER — FENTANYL CITRATE PF 50 MCG/ML IJ SOSY
25.0000 ug | PREFILLED_SYRINGE | INTRAMUSCULAR | Status: DC | PRN
  Administered 2021-08-11 (×3): 50 ug via INTRAVENOUS

## 2021-08-11 MED ORDER — ORAL CARE MOUTH RINSE: 15.0000 mL | Freq: Once | OROMUCOSAL | Status: AC

## 2021-08-11 MED ORDER — METOCLOPRAMIDE HCL 5 MG PO TABS: 5.0000 mg | ORAL_TABLET | Freq: Three times a day (TID) | ORAL | Status: DC | PRN

## 2021-08-11 MED ORDER — AMISULPRIDE (ANTIEMETIC) 5 MG/2ML IV SOLN: 10.0000 mg | Freq: Once | INTRAVENOUS | Status: DC | PRN

## 2021-08-11 MED ORDER — MORPHINE SULFATE (PF) 2 MG/ML IV SOLN
INTRAVENOUS | Status: AC
  Filled 2021-08-11: qty 1

## 2021-08-11 MED ORDER — ONDANSETRON HCL 4 MG PO TABS: 4.0000 mg | ORAL_TABLET | Freq: Four times a day (QID) | ORAL | Status: DC | PRN

## 2021-08-11 MED ORDER — METHOCARBAMOL 500 MG IVPB - SIMPLE MED
500.0000 mg | Freq: Four times a day (QID) | INTRAVENOUS | Status: DC | PRN
  Administered 2021-08-11: 500 mg via INTRAVENOUS

## 2021-08-11 MED ORDER — FENTANYL CITRATE PF 50 MCG/ML IJ SOSY
PREFILLED_SYRINGE | INTRAMUSCULAR | Status: AC
  Filled 2021-08-11: qty 3

## 2021-08-11 MED ORDER — ONDANSETRON HCL 4 MG/2ML IJ SOLN: 4.0000 mg | Freq: Once | INTRAMUSCULAR | Status: DC | PRN

## 2021-08-11 MED ORDER — PHENOL 1.4 % MT LIQD: 1.0000 | OROMUCOSAL | Status: DC | PRN

## 2021-08-11 MED ORDER — BUPIVACAINE-EPINEPHRINE (PF) 0.25% -1:200000 IJ SOLN
INTRAMUSCULAR | Status: AC
Start: 2021-08-11 — End: ?
  Filled 2021-08-11: qty 30

## 2021-08-11 MED ORDER — METHOCARBAMOL 500 MG PO TABS
500.0000 mg | ORAL_TABLET | Freq: Four times a day (QID) | ORAL | Status: DC | PRN
  Administered 2021-08-11 – 2021-08-13 (×5): 500 mg via ORAL
  Filled 2021-08-11 (×6): qty 1

## 2021-08-11 MED ORDER — ROCURONIUM BROMIDE 100 MG/10ML IV SOLN
INTRAVENOUS | Status: DC | PRN
  Administered 2021-08-11: 40 mg via INTRAVENOUS

## 2021-08-11 MED ORDER — TRANEXAMIC ACID-NACL 1000-0.7 MG/100ML-% IV SOLN
1000.0000 mg | INTRAVENOUS | Status: AC
  Administered 2021-08-11: 1000 mg via INTRAVENOUS
  Filled 2021-08-11: qty 100

## 2021-08-11 MED ORDER — ONDANSETRON HCL 4 MG/2ML IJ SOLN
INTRAMUSCULAR | Status: DC | PRN
  Administered 2021-08-11: 4 mg via INTRAVENOUS

## 2021-08-11 MED ORDER — MEMANTINE HCL 10 MG PO TABS
10.0000 mg | ORAL_TABLET | Freq: Two times a day (BID) | ORAL | Status: DC
  Administered 2021-08-11 – 2021-08-13 (×4): 10 mg via ORAL
  Filled 2021-08-11 (×4): qty 1

## 2021-08-11 MED ORDER — AMLODIPINE BESYLATE 5 MG PO TABS
5.0000 mg | ORAL_TABLET | Freq: Every day | ORAL | Status: DC
  Administered 2021-08-12 – 2021-08-13 (×2): 5 mg via ORAL
  Filled 2021-08-11 (×2): qty 1

## 2021-08-11 MED ORDER — DOCUSATE SODIUM 100 MG PO CAPS
100.0000 mg | ORAL_CAPSULE | Freq: Two times a day (BID) | ORAL | Status: DC
  Administered 2021-08-11 – 2021-08-13 (×4): 100 mg via ORAL
  Filled 2021-08-11 (×4): qty 1

## 2021-08-11 MED ORDER — DEXAMETHASONE SODIUM PHOSPHATE 10 MG/ML IJ SOLN
10.0000 mg | Freq: Once | INTRAMUSCULAR | Status: AC
  Administered 2021-08-12: 10 mg via INTRAVENOUS
  Filled 2021-08-11: qty 1

## 2021-08-11 MED ORDER — SUGAMMADEX SODIUM 200 MG/2ML IV SOLN
INTRAVENOUS | Status: DC | PRN
  Administered 2021-08-11: 200 mg via INTRAVENOUS

## 2021-08-11 MED ORDER — FAMOTIDINE 20 MG PO TABS
20.0000 mg | ORAL_TABLET | Freq: Every day | ORAL | Status: DC
  Administered 2021-08-12 – 2021-08-13 (×2): 20 mg via ORAL
  Filled 2021-08-11 (×2): qty 1

## 2021-08-11 MED ORDER — SODIUM CHLORIDE 0.9 % IV SOLN: INTRAVENOUS | Status: DC

## 2021-08-11 MED ORDER — ONDANSETRON HCL 4 MG/2ML IJ SOLN
4.0000 mg | Freq: Four times a day (QID) | INTRAMUSCULAR | Status: DC | PRN
  Administered 2021-08-11: 4 mg via INTRAVENOUS

## 2021-08-11 MED ORDER — DEXAMETHASONE SODIUM PHOSPHATE 10 MG/ML IJ SOLN
8.0000 mg | Freq: Once | INTRAMUSCULAR | Status: AC
  Administered 2021-08-11: 4 mg via INTRAVENOUS

## 2021-08-11 MED ORDER — POLYETHYLENE GLYCOL 3350 17 G PO PACK: 17.0000 g | PACK | Freq: Every day | ORAL | Status: DC | PRN

## 2021-08-11 MED ORDER — HYDROCODONE-ACETAMINOPHEN 5-325 MG PO TABS
1.0000 | ORAL_TABLET | ORAL | Status: DC | PRN
  Administered 2021-08-12 (×2): 1 via ORAL
  Administered 2021-08-12: 2 via ORAL
  Administered 2021-08-13: 1 via ORAL
  Filled 2021-08-11 (×2): qty 1
  Filled 2021-08-11: qty 2
  Filled 2021-08-11: qty 1

## 2021-08-11 MED ORDER — CHLORHEXIDINE GLUCONATE 0.12 % MT SOLN
15.0000 mL | Freq: Once | OROMUCOSAL | Status: AC
  Administered 2021-08-11: 15 mL via OROMUCOSAL

## 2021-08-11 MED ORDER — METHOCARBAMOL 500 MG IVPB - SIMPLE MED
INTRAVENOUS | Status: AC
  Filled 2021-08-11: qty 50

## 2021-08-11 MED ORDER — BUPIVACAINE-EPINEPHRINE (PF) 0.25% -1:200000 IJ SOLN
INTRAMUSCULAR | Status: DC | PRN
  Administered 2021-08-11: 30 mL via PERINEURAL

## 2021-08-11 MED ORDER — DIPHENHYDRAMINE HCL 12.5 MG/5ML PO ELIX: 12.5000 mg | ORAL_SOLUTION | ORAL | Status: DC | PRN

## 2021-08-11 MED ORDER — PROPOFOL 10 MG/ML IV BOLUS
INTRAVENOUS | Status: DC | PRN
  Administered 2021-08-11: 100 mg via INTRAVENOUS
  Administered 2021-08-11: 70 ug/kg/min via INTRAVENOUS

## 2021-08-11 MED ORDER — ONDANSETRON HCL 4 MG/2ML IJ SOLN
INTRAMUSCULAR | Status: AC
  Filled 2021-08-11: qty 2

## 2021-08-11 MED ORDER — RIVAROXABAN 10 MG PO TABS
10.0000 mg | ORAL_TABLET | Freq: Every day | ORAL | Status: DC
  Administered 2021-08-12 – 2021-08-13 (×2): 10 mg via ORAL
  Filled 2021-08-11 (×2): qty 1

## 2021-08-11 MED ORDER — LIDOCAINE HCL (CARDIAC) PF 100 MG/5ML IV SOSY
PREFILLED_SYRINGE | INTRAVENOUS | Status: DC | PRN
  Administered 2021-08-11: 60 mg via INTRAVENOUS

## 2021-08-11 MED ORDER — FENTANYL CITRATE (PF) 100 MCG/2ML IJ SOLN
INTRAMUSCULAR | Status: DC | PRN
  Administered 2021-08-11: 100 ug via INTRAVENOUS

## 2021-08-11 MED ORDER — DEXAMETHASONE SODIUM PHOSPHATE 10 MG/ML IJ SOLN
INTRAMUSCULAR | Status: AC
  Filled 2021-08-11: qty 1

## 2021-08-11 MED ORDER — PHENYLEPHRINE HCL-NACL 20-0.9 MG/250ML-% IV SOLN
INTRAVENOUS | Status: DC | PRN
  Administered 2021-08-11: 30 ug/min via INTRAVENOUS

## 2021-08-11 MED ORDER — LEVOTHYROXINE SODIUM 50 MCG PO TABS
50.0000 ug | ORAL_TABLET | Freq: Every day | ORAL | Status: DC
  Administered 2021-08-12 – 2021-08-13 (×2): 50 ug via ORAL
  Filled 2021-08-11 (×2): qty 1

## 2021-08-11 MED ORDER — CEFAZOLIN SODIUM-DEXTROSE 2-4 GM/100ML-% IV SOLN
2.0000 g | Freq: Four times a day (QID) | INTRAVENOUS | Status: AC
  Administered 2021-08-11 – 2021-08-12 (×2): 2 g via INTRAVENOUS
  Filled 2021-08-11 (×2): qty 100

## 2021-08-11 MED ORDER — ACETAMINOPHEN 325 MG PO TABS: 325.0000 mg | ORAL_TABLET | Freq: Four times a day (QID) | ORAL | Status: DC | PRN

## 2021-08-11 MED ORDER — BISACODYL 10 MG RE SUPP: 10.0000 mg | Freq: Every day | RECTAL | Status: DC | PRN

## 2021-08-11 MED ORDER — MORPHINE SULFATE (PF) 2 MG/ML IV SOLN
0.5000 mg | INTRAVENOUS | Status: DC | PRN
  Administered 2021-08-11: 0.5 mg via INTRAVENOUS

## 2021-08-11 MED ORDER — ACETAMINOPHEN 10 MG/ML IV SOLN
1000.0000 mg | Freq: Four times a day (QID) | INTRAVENOUS | Status: DC
  Administered 2021-08-11: 1000 mg via INTRAVENOUS
  Filled 2021-08-11: qty 100

## 2021-08-11 SURGICAL SUPPLY — 47 items
BAG COUNTER SPONGE SURGICOUNT (BAG) ×1 IMPLANT
BAG DECANTER FOR FLEXI CONT (MISCELLANEOUS) IMPLANT
BAG SPEC THK2 15X12 ZIP CLS (MISCELLANEOUS)
BAG SPNG CNTER NS LX DISP (BAG) ×1
BAG ZIPLOCK 12X15 (MISCELLANEOUS) IMPLANT
BLADE SAG 18X100X1.27 (BLADE) ×3 IMPLANT
CLSR STERI-STRIP ANTIMIC 1/2X4 (GAUZE/BANDAGES/DRESSINGS) ×1 IMPLANT
COVER PERINEAL POST (MISCELLANEOUS) ×3 IMPLANT
COVER SURGICAL LIGHT HANDLE (MISCELLANEOUS) ×3 IMPLANT
CUP ACETBLR 48 OD SECTOR II (Hips) ×1 IMPLANT
DRAPE FOOT SWITCH (DRAPES) ×3 IMPLANT
DRAPE STERI IOBAN 125X83 (DRAPES) ×3 IMPLANT
DRAPE U-SHAPE 47X51 STRL (DRAPES) ×6 IMPLANT
DRSG AQUACEL AG ADV 3.5X10 (GAUZE/BANDAGES/DRESSINGS) ×3 IMPLANT
DURAPREP 26ML APPLICATOR (WOUND CARE) ×3 IMPLANT
ELECT REM PT RETURN 15FT ADLT (MISCELLANEOUS) ×3 IMPLANT
GLOVE BIO SURGEON STRL SZ 6.5 (GLOVE) ×1 IMPLANT
GLOVE BIO SURGEON STRL SZ7.5 (GLOVE) IMPLANT
GLOVE BIO SURGEON STRL SZ8 (GLOVE) ×3 IMPLANT
GLOVE BIOGEL PI IND STRL 6.5 (GLOVE) IMPLANT
GLOVE BIOGEL PI IND STRL 7.0 (GLOVE) IMPLANT
GLOVE BIOGEL PI IND STRL 8 (GLOVE) ×2 IMPLANT
GLOVE BIOGEL PI INDICATOR 6.5 (GLOVE)
GLOVE BIOGEL PI INDICATOR 7.0 (GLOVE) ×1
GLOVE BIOGEL PI INDICATOR 8 (GLOVE) ×1
GOWN STRL REUS W/ TWL LRG LVL3 (GOWN DISPOSABLE) ×2 IMPLANT
GOWN STRL REUS W/ TWL XL LVL3 (GOWN DISPOSABLE) IMPLANT
GOWN STRL REUS W/TWL LRG LVL3 (GOWN DISPOSABLE) ×2
GOWN STRL REUS W/TWL XL LVL3 (GOWN DISPOSABLE)
HEAD DELTA 28MM 12/14 P8.5 HIP (Head) ×1 IMPLANT
HOLDER FOLEY CATH W/STRAP (MISCELLANEOUS) ×3 IMPLANT
KIT TURNOVER KIT A (KITS) ×1 IMPLANT
LINER MARATHON 28 48 (Hips) ×1 IMPLANT
MANIFOLD NEPTUNE II (INSTRUMENTS) ×3 IMPLANT
PACK ANTERIOR HIP CUSTOM (KITS) ×3 IMPLANT
PENCIL SMOKE EVACUATOR COATED (MISCELLANEOUS) ×3 IMPLANT
SPIKE FLUID TRANSFER (MISCELLANEOUS) ×3 IMPLANT
STEM FEM SZ3 STD ACTIS (Stem) ×1 IMPLANT
STRIP CLOSURE SKIN 1/2X4 (GAUZE/BANDAGES/DRESSINGS) ×3 IMPLANT
SUT ETHIBOND NAB CT1 #1 30IN (SUTURE) ×3 IMPLANT
SUT MNCRL AB 4-0 PS2 18 (SUTURE) ×3 IMPLANT
SUT STRATAFIX 0 PDS 27 VIOLET (SUTURE) ×2
SUT VIC AB 2-0 CT1 27 (SUTURE) ×4
SUT VIC AB 2-0 CT1 TAPERPNT 27 (SUTURE) ×4 IMPLANT
SUTURE STRATFX 0 PDS 27 VIOLET (SUTURE) ×2 IMPLANT
TRAY FOLEY MTR SLVR 16FR STAT (SET/KITS/TRAYS/PACK) ×3 IMPLANT
TUBE SUCTION HIGH CAP CLEAR NV (SUCTIONS) ×3 IMPLANT

## 2021-08-11 NOTE — Op Note (Signed)
OPERATIVE REPORT- TOTAL HIP ARTHROPLASTY   PREOPERATIVE DIAGNOSIS: Osteoarthritis of the Right hip.   POSTOPERATIVE DIAGNOSIS: Osteoarthritis of the Right  hip.   PROCEDURE: Right total hip arthroplasty, anterior approach.   SURGEON: Gaynelle Arabian, MD   ASSISTANT: Theresa Duty, PA-C  ANESTHESIA:  Spinal  ESTIMATED BLOOD LOSS:-300 mL    DRAINS: None  COMPLICATIONS: None   CONDITION: PACU - hemodynamically stable.   BRIEF CLINICAL NOTE: Tammy Neal is a 71 y.o. female who has advanced end-  stage arthritis of their Right  hip with progressively worsening pain and  dysfunction.The patient has failed nonoperative management and presents for  total hip arthroplasty.   PROCEDURE IN DETAIL: After successful administration of spinal  anesthetic, the traction boots for the [facility] bed were placed on both  feet and the patient was placed onto the [facility] bed, boots placed into the leg  holders. The Right hip was then isolated from the perineum with plastic  drapes and prepped and draped in the usual sterile fashion. ASIS and  greater trochanter were marked and a oblique incision was made, starting  at about 1 cm lateral and 2 cm distal to the ASIS and coursing towards  the anterior cortex of the femur. The skin was cut with a 10 blade  through subcutaneous tissue to the level of the fascia overlying the  tensor fascia lata muscle. The fascia was then incised in line with the  incision at the junction of the anterior third and posterior 2/3rd. The  muscle was teased off the fascia and then the interval between the TFL  and the rectus was developed. The Hohmann retractor was then placed at  the top of the femoral neck over the capsule. The vessels overlying the  capsule were cauterized and the fat on top of the capsule was removed.  A Hohmann retractor was then placed anterior underneath the rectus  femoris to give exposure to the entire anterior capsule. A T-shaped   capsulotomy was performed. The edges were tagged and the femoral head  was identified.       Osteophytes are removed off the superior acetabulum.  The femoral neck was then cut in situ with an oscillating saw. Traction  was then applied to the left lower extremity utilizing the [facility]  traction. The femoral head was then removed. Retractors were placed  around the acetabulum and then circumferential removal of the labrum was  performed. Osteophytes were also removed. Reaming starts at 45 mm to  medialize and  Increased in 2 mm increments to 57 mm. We reamed in  approximately 40 degrees of abduction, 20 degrees anteversion. A 58 mm  pinnacle acetabular shell was then impacted in anatomic position under  fluoroscopic guidance with excellent purchase. We did not need to place  any additional dome screws. A 28 mm neutral + 4 marathon liner was then  placed into the acetabular shell.       The femoral lift was then placed along the lateral aspect of the femur  just distal to the vastus ridge. The leg was  externally rotated and capsule  was stripped off the inferior aspect of the femoral neck down to the  level of the lesser trochanter, this was done with electrocautery. The femur was lifted after this was performed. The  leg was then placed in an extended and adducted position essentially delivering the femur. We also removed the capsule superiorly and the piriformis from the piriformis fossa to  gain excellent exposure of the  proximal femur. Rongeur was used to remove some cancellous bone to get  into the lateral portion of the proximal femur for placement of the  initial starter reamer. The starter broaches was placed  the starter broach  and was shown to go down the center of the canal. Broaching  with the Actis system was then performed starting at size 0  coursing  Up to size 3. A size 3 had excellent torsional and rotational  and axial stability. The trial standard offset neck was then  placed  with a 28 + 8.5 trial head. The hip was then reduced. We confirmed that  the stem was in the canal both on AP and lateral x-rays. It also has excellent sizing. The hip was reduced with outstanding stability through full extension and full external rotation.. AP pelvis was taken and the leg lengths were measured and found to be equal. Hip was then dislocated again and the femoral head and neck removed. The  femoral broach was removed. Size 3 Actis stem with a standard offset  neck was then impacted into the femur following native anteversion. Has  excellent purchase in the canal. Excellent torsional and rotational and  axial stability. It is confirmed to be in the canal on AP and lateral  fluoroscopic views. The 28 + 8.5 ceramic head was placed and the hip  reduced with outstanding stability. Again AP pelvis was taken and it  confirmed that the leg lengths were equal. The wound was then copiously  irrigated with saline solution and the capsule reattached and repaired  with Ethibond suture. 30 ml of .25% Bupivicaine was  injected into the capsule and into the edge of the tensor fascia lata as well as subcutaneous tissue. The fascia overlying the tensor fascia lata was then closed with a running #1 V-Loc. Subcu was closed with interrupted 2-0 Vicryl and subcuticular running 4-0 Monocryl. Incision was cleaned  and dried. Steri-Strips and a bulky sterile dressing applied. The patient was awakened and transported to  recovery in stable condition.        Please note that a surgical assistant was a medical necessity for this procedure to perform it in a safe and expeditious manner. Assistant was necessary to provide appropriate retraction of vital neurovascular structures and to prevent femoral fracture and allow for anatomic placement of the prosthesis.  Gaynelle Arabian, M.D.

## 2021-08-11 NOTE — Interval H&P Note (Signed)
History and Physical Interval Note:  08/11/2021 12:39 PM  Tammy Neal  has presented today for surgery, with the diagnosis of right hip osteoarthritis.  The various methods of treatment have been discussed with the patient and family. After consideration of risks, benefits and other options for treatment, the patient has consented to  Procedure(s): TOTAL HIP ARTHROPLASTY ANTERIOR APPROACH (Right) as a surgical intervention.  The patient's history has been reviewed, patient examined, no change in status, stable for surgery.  I have reviewed the patient's chart and labs.  Questions were answered to the patient's satisfaction.     [name]

## 2021-08-11 NOTE — Discharge Instructions (Addendum)
Tammy Arabian, MD Total Joint Specialist [facility] [place] [address], [address] [place], [place] [address] [phone]  ANTERIOR APPROACH TOTAL HIP REPLACEMENT POSTOPERATIVE DIRECTIONS     Hip Rehabilitation, Guidelines Following Surgery  The results of a hip operation are greatly improved after range of motion and muscle strengthening exercises. Follow all safety measures which are given to protect your hip. If any of these exercises cause increased pain or swelling in your joint, decrease the amount until you are comfortable again. Then slowly increase the exercises. Call your caregiver if you have problems or questions.   BLOOD CLOT PREVENTION Take a 10 mg Xarelto once a day for three weeks following surgery. Then discontinue. You may resume your vitamins/supplements once you have discontinued the Xarelto. Do not take any NSAIDs (Advil, Aleve, Ibuprofen, Meloxicam, etc.) until you have discontinued the Xarelto.   HOME CARE INSTRUCTIONS  Remove items at home which could result in a fall. This includes throw rugs or furniture in walking pathways.  ICE to the affected hip as frequently as 20-30 minutes an hour and then as needed for pain and swelling. Continue to use ice on the hip for pain and swelling from surgery. You may notice swelling that will progress down to the foot and ankle. This is normal after surgery. Elevate the leg when you are not up walking on it.   Continue to use the breathing machine which will help keep your temperature down.  It is common for your temperature to cycle up and down following surgery, especially at night when you are not up moving around and exerting yourself.  The breathing machine keeps your lungs expanded and your temperature down.  DIET You may resume your previous home diet once your are discharged from the hospital.  DRESSING / WOUND CARE / SHOWERING You have an adhesive waterproof bandage over the incision. Leave this in place  until your first follow-up appointment. Once you remove this you will not need to place another bandage.  You may begin showering 3 days following surgery, but do not submerge the incision under water.  ACTIVITY For the first 3-5 days, it is important to rest and keep the operative leg elevated. You should, as a general rule, rest for 50 minutes and walk/stretch for 10 minutes per hour. After 5 days, you may slowly increase activity as tolerated.  Perform the exercises you were provided twice a day for about 15-20 minutes each session. Begin these 2 days following surgery. Walk with your walker as instructed. Use the walker until you are comfortable transitioning to a cane. Walk with the cane in the opposite hand of the operative leg. You may discontinue the cane once you are comfortable and walking steadily. Avoid periods of inactivity such as sitting longer than an hour when not asleep. This helps prevent blood clots.  Do not drive a car for 6 weeks or until released by your surgeon.  Do not drive while taking narcotics.  TED HOSE STOCKINGS Wear the elastic stockings on both legs for three weeks following surgery during the day. You may remove them at night while sleeping.  WEIGHT BEARING Weight bearing as tolerated with assist device (walker, cane, etc) as directed, use it as long as suggested by your surgeon or therapist, typically at least 4-6 weeks.  POSTOPERATIVE CONSTIPATION PROTOCOL Constipation - defined medically as fewer than three stools per week and severe constipation as less than one stool per week.  One of the most common issues patients  have following surgery is constipation.  Even if you have a regular bowel pattern at home, your normal regimen is likely to be disrupted due to multiple reasons following surgery.  Combination of anesthesia, postoperative narcotics, change in appetite and fluid intake all can affect your bowels.  In order to avoid complications following surgery,  here are some recommendations in order to help you during your recovery period.  Colace (docusate) - Pick up an over-the-counter form of Colace or another stool softener and take twice a day as long as you are requiring postoperative pain medications.  Take with a full glass of water daily.  If you experience loose stools or diarrhea, hold the colace until you stool forms back up.  If your symptoms do not get better within 1 week or if they get worse, check with your doctor. Dulcolax (bisacodyl) - Pick up over-the-counter and take as directed by the product packaging as needed to assist with the movement of your bowels.  Take with a full glass of water.  Use this product as needed if not relieved by Colace only.  MiraLax (polyethylene glycol) - Pick up over-the-counter to have on hand.  MiraLax is a solution that will increase the amount of water in your bowels to assist with bowel movements.  Take as directed and can mix with a glass of water, juice, soda, coffee, or tea.  Take if you go more than two days without a movement.Do not use MiraLax more than once per day. Call your doctor if you are still constipated or irregular after using this medication for 7 days in a row.  If you continue to have problems with postoperative constipation, please contact the office for further assistance and recommendations.  If you experience "the worst abdominal pain ever" or develop nausea or vomiting, please contact the office immediatly for further recommendations for treatment.  ITCHING  If you experience itching with your medications, try taking only a single pain pill, or even half a pain pill at a time.  You can also use Benadryl over the counter for itching or also to help with sleep.   MEDICATIONS See your medication summary on the "After Visit Summary" that the nursing staff will review with you prior to discharge.  You may have some home medications which will be placed on hold until you complete the course  of blood thinner medication.  It is important for you to complete the blood thinner medication as prescribed by your surgeon.  Continue your approved medications as instructed at time of discharge.  PRECAUTIONS If you experience chest pain or shortness of breath - call 911 immediately for transfer to the hospital emergency department.  If you develop a fever greater that 101 F, purulent drainage from wound, increased redness or drainage from wound, foul odor from the wound/dressing, or calf pain - CONTACT YOUR SURGEON.                                                   FOLLOW-UP APPOINTMENTS Make sure you keep all of your appointments after your operation with your surgeon and caregivers. You should call the office at the above phone number and make an appointment for approximately two weeks after the date of your surgery or on the date instructed by your surgeon outlined in the "After Visit Summary".  RANGE OF  MOTION AND STRENGTHENING EXERCISES  These exercises are designed to help you keep full movement of your hip joint. Follow your caregiver's or physical therapist's instructions. Perform all exercises about fifteen times, three times per day or as directed. Exercise both hips, even if you have had only one joint replacement. These exercises can be done on a training (exercise) mat, on the floor, on a table or on a bed. Use whatever works the best and is most comfortable for you. Use music or television while you are exercising so that the exercises are a pleasant break in your day. This will make your life better with the exercises acting as a break in routine you can look forward to.  Lying on your back, slowly slide your foot toward your buttocks, raising your knee up off the floor. Then slowly slide your foot back down until your leg is straight again.  Lying on your back spread your legs as far apart as you can without causing discomfort.  Lying on your side, raise your upper leg and foot  straight up from the floor as far as is comfortable. Slowly lower the leg and repeat.  Lying on your back, tighten up the muscle in the front of your thigh (quadriceps muscles). You can do this by keeping your leg straight and trying to raise your heel off the floor. This helps strengthen the largest muscle supporting your knee.  Lying on your back, tighten up the muscles of your buttocks both with the legs straight and with the knee bent at a comfortable angle while keeping your heel on the floor.   POST-OPERATIVE OPIOID TAPER INSTRUCTIONS: It is important to wean off of your opioid medication as soon as possible. If you do not need pain medication after your surgery it is ok to stop day one. Opioids include: Codeine, Hydrocodone(Norco, Vicodin), Oxycodone(Percocet, oxycontin) and hydromorphone amongst others.  Long term and even short term use of opiods can cause: Increased pain response Dependence Constipation Depression Respiratory depression And more.  Withdrawal symptoms can include Flu like symptoms Nausea, vomiting And more Techniques to manage these symptoms Hydrate well Eat regular healthy meals Stay active Use relaxation techniques(deep breathing, meditating, yoga) Do Not substitute Alcohol to help with tapering If you have been on opioids for less than two weeks and do not have pain than it is ok to stop all together.  Plan to wean off of opioids This plan should start within one week post op of your joint replacement. Maintain the same interval or time between taking each dose and first decrease the dose.  Cut the total daily intake of opioids by one tablet each day Next start to increase the time between doses. The last dose that should be eliminated is the evening dose.   IF YOU ARE TRANSFERRED TO A SKILLED REHAB FACILITY If the patient is transferred to a skilled rehab facility following release from the hospital, a list of the current medications will be sent to the  facility for the patient to continue.  When discharged from the skilled rehab facility, please have the facility set up the patient's [place] prior to being released. Also, the skilled facility will be responsible for providing the patient with their medications at time of release from the facility to include their pain medication, the muscle relaxants, and their blood thinner medication. If the patient is still at the rehab facility at time of the two week follow up appointment, the skilled rehab facility will  also need to assist the patient in arranging follow up appointment in our office and any transportation needs.  MAKE SURE YOU:  Understand these instructions.  Get help right away if you are not doing well or get worse.    DENTAL ANTIBIOTICS:  In most cases prophylactic antibiotics for Dental procdeures after total joint surgery are not necessary.  Exceptions are as follows:  1. History of prior total joint infection  2. Severely immunocompromised (Organ Transplant, cancer chemotherapy, Rheumatoid biologic meds such as [place])  3. Poorly controlled diabetes (A1C &gt; 8.0, blood glucose over 200)  If you have one of these conditions, contact your surgeon for an antibiotic prescription, prior to your dental procedure.    Pick up stool softner and laxative for home use following surgery while on pain medications. Do not submerge incision under water. Please use good hand washing techniques while changing dressing each day. May shower starting three days after surgery. Please use a clean towel to pat the incision dry following showers. Continue to use ice for pain and swelling after surgery. Do not use any lotions or creams on the incision until instructed by your surgeon. _______________________________________________  Information on my medicine - XARELTO (Rivaroxaban)  This medication education was reviewed with me or my healthcare representative as part  of my discharge preparation.    Why was Xarelto prescribed for you? Xarelto was prescribed for you to reduce the risk of blood clots forming after orthopedic surgery. The medical term for these abnormal blood clots is venous thromboembolism (VTE).  What do you need to know about xarelto ? Take your Xarelto ONCE DAILY at the same time every day. You may take it either with or without food.  If you have difficulty swallowing the tablet whole, you may crush it and mix in applesauce just prior to taking your dose.  Take Xarelto exactly as prescribed by your doctor and DO NOT stop taking Xarelto without talking to the doctor who prescribed the medication.  Stopping without other VTE prevention medication to take the place of Xarelto may increase your risk of developing a clot.  After discharge, you should have regular check-up appointments with your healthcare provider that is prescribing your Xarelto.    What do you do if you miss a dose? If you miss a dose, take it as soon as you remember on the same day then continue your regularly scheduled once daily regimen the next day. Do not take two doses of Xarelto on the same day.   Important Safety Information A possible side effect of Xarelto is bleeding. You should call your healthcare provider right away if you experience any of the following: Bleeding from an injury or your nose that does not stop. Unusual colored urine (red or dark brown) or unusual colored stools (red or black). Unusual bruising for unknown reasons. A serious fall or if you hit your head (even if there is no bleeding).  Some medicines may interact with Xarelto and might increase your risk of bleeding while on Xarelto. To help avoid this, consult your healthcare provider or pharmacist prior to using any new prescription or non-prescription medications, including herbals, vitamins, non-steroidal anti-inflammatory drugs (NSAIDs) and supplements.  This website has  more information on Xarelto: [email or web address]

## 2021-08-11 NOTE — Plan of Care (Signed)
  Problem: Activity: Goal: Risk for activity intolerance will decrease Outcome: Progressing   Problem: Pain Managment: Goal: General experience of comfort will improve Outcome: Progressing   Problem: Safety: Goal: Ability to remain free from injury will improve Outcome: Progressing   

## 2021-08-11 NOTE — Anesthesia Preprocedure Evaluation (Signed)
Anesthesia Evaluation  Patient identified by MRN, date of birth, ID band Patient awake    Reviewed: Allergy & Precautions, NPO status , Patient's Chart, lab work & pertinent test results  Airway Mallampati: II  TM Distance: >3 FB Neck ROM: Full    Dental no notable dental hx.    Pulmonary COPD, former smoker,    Pulmonary exam normal        Cardiovascular hypertension, Pt. on medications Normal cardiovascular exam     Neuro/Psych PSYCHIATRIC DISORDERS Anxiety Depression negative neurological ROS     GI/Hepatic Neg liver ROS, GERD  Medicated and Controlled,  Endo/Other  Hypothyroidism   Renal/GU negative Renal ROS     Musculoskeletal  (+) Arthritis ,   Abdominal   Peds  Hematology negative hematology ROS (+)   Anesthesia Other Findings right hip osteoarthritis  Reproductive/Obstetrics                             Anesthesia Physical Anesthesia Plan  ASA: 3  Anesthesia Plan: Spinal   Post-op Pain Management:    Induction: Intravenous  PONV Risk Score and Plan: 2 and Ondansetron, Dexamethasone, Propofol infusion and Treatment may vary due to age or medical condition  Airway Management Planned: Simple Face Mask  Additional Equipment:   Intra-op Plan:   Post-operative Plan:   Informed Consent: I have reviewed the patients History and Physical, chart, labs and discussed the procedure including the risks, benefits and alternatives for the proposed anesthesia with the patient or authorized representative who has indicated his/her understanding and acceptance.     Dental advisory given  Plan Discussed with: CRNA  Anesthesia Plan Comments:         Anesthesia Quick Evaluation

## 2021-08-11 NOTE — Anesthesia Procedure Notes (Signed)
Procedure Name: Intubation Date/Time: 08/11/2021 1:57 PM  Performed by: Cleda Daub, CRNAPre-anesthesia Checklist: Patient identified, Emergency Drugs available, Suction available and Patient being monitored Patient Re-evaluated:Patient Re-evaluated prior to induction Oxygen Delivery Method: Circle system utilized Preoxygenation: Pre-oxygenation with 100% oxygen Induction Type: IV induction Ventilation: Mask ventilation without difficulty Laryngoscope Size: Mac and 3 Grade View: Grade I Tube type: Oral Number of attempts: 1 Airway Equipment and Method: Stylet and Oral airway Placement Confirmation: ETT inserted through vocal cords under direct vision, positive ETCO2 and breath sounds checked- equal and bilateral Secured at: 20 cm Tube secured with: Tape Dental Injury: Teeth and Oropharynx as per pre-operative assessment

## 2021-08-11 NOTE — Transfer of Care (Signed)
Immediate Anesthesia Transfer of Care Note  Patient: Tammy Neal  Procedure(s) Performed: TOTAL HIP ARTHROPLASTY ANTERIOR APPROACH (Right: Hip)  Patient Location: PACU  Anesthesia Type:General  Level of Consciousness: awake, alert , oriented and patient cooperative  Airway & Oxygen Therapy: Patient Spontanous Breathing and Patient connected to face mask oxygen  Post-op Assessment: Report given to RN and Post -op Vital signs reviewed and stable  Post vital signs: Reviewed and stable  Last Vitals:  Vitals Value Taken Time  BP    Temp    Pulse 58 08/11/21 1527  Resp 14 08/11/21 1527  SpO2 99 % 08/11/21 1527  Vitals shown include unvalidated device data.  Last Pain:  Vitals:   08/11/21 1154  TempSrc:   PainSc: 0-No pain      Patients Stated Pain Goal: 4 (75/44/92 0100)  Complications: No notable events documented.

## 2021-08-12 ENCOUNTER — Encounter (HOSPITAL_COMMUNITY): Payer: Self-pay | Admitting: Orthopedic Surgery

## 2021-08-12 DIAGNOSIS — Z79899 Other long term (current) drug therapy: Secondary | ICD-10-CM | POA: Diagnosis not present

## 2021-08-12 DIAGNOSIS — Z87891 Personal history of nicotine dependence: Secondary | ICD-10-CM | POA: Diagnosis not present

## 2021-08-12 DIAGNOSIS — Z96642 Presence of left artificial hip joint: Secondary | ICD-10-CM | POA: Diagnosis not present

## 2021-08-12 DIAGNOSIS — J449 Chronic obstructive pulmonary disease, unspecified: Secondary | ICD-10-CM | POA: Diagnosis not present

## 2021-08-12 DIAGNOSIS — M1611 Unilateral primary osteoarthritis, right hip: Secondary | ICD-10-CM | POA: Diagnosis not present

## 2021-08-12 DIAGNOSIS — I10 Essential (primary) hypertension: Secondary | ICD-10-CM | POA: Diagnosis not present

## 2021-08-12 DIAGNOSIS — E039 Hypothyroidism, unspecified: Secondary | ICD-10-CM | POA: Diagnosis not present

## 2021-08-12 LAB — BASIC METABOLIC PANEL WITH GFR
Anion gap: 9 (ref 5–15)
BUN: 12 mg/dL (ref 8–23)
CO2: 25 mmol/L (ref 22–32)
Calcium: 8.5 mg/dL — ABNORMAL LOW (ref 8.9–10.3)
Chloride: 107 mmol/L (ref 98–111)
Creatinine, Ser: 0.9 mg/dL (ref 0.44–1.00)
GFR, Estimated: >60 mL/min
Glucose, Bld: 174 mg/dL — ABNORMAL HIGH (ref 70–99)
Potassium: 3.9 mmol/L (ref 3.5–5.1)
Sodium: 141 mmol/L (ref 135–145)

## 2021-08-12 LAB — CBC
HCT: 40.2 % (ref 36.0–46.0)
Hemoglobin: 12.9 g/dL (ref 12.0–15.0)
MCH: 31.8 pg (ref 26.0–34.0)
MCHC: 32.1 g/dL (ref 30.0–36.0)
MCV: 99 fL (ref 80.0–100.0)
Platelets: 213 K/uL (ref 150–400)
RBC: 4.06 MIL/uL (ref 3.87–5.11)
RDW: 12.9 % (ref 11.5–15.5)
WBC: 17.6 K/uL — ABNORMAL HIGH (ref 4.0–10.5)
nRBC: 0 % (ref 0.0–0.2)

## 2021-08-12 MED ORDER — RIVAROXABAN 10 MG PO TABS: 10.0000 mg | ORAL_TABLET | Freq: Every day | ORAL | 0 refills | Status: AC

## 2021-08-12 MED ORDER — HYDROCODONE-ACETAMINOPHEN 5-325 MG PO TABS: 1.0000 | ORAL_TABLET | Freq: Four times a day (QID) | ORAL | 0 refills | Status: DC | PRN

## 2021-08-12 MED ORDER — TRAMADOL HCL 50 MG PO TABS: 50.0000 mg | ORAL_TABLET | Freq: Four times a day (QID) | ORAL | 0 refills | Status: DC | PRN

## 2021-08-12 MED ORDER — METHOCARBAMOL 500 MG PO TABS: 500.0000 mg | ORAL_TABLET | Freq: Four times a day (QID) | ORAL | 0 refills | Status: DC | PRN

## 2021-08-12 NOTE — Care Plan (Signed)
Ortho Bundle Case Management Note  Patient Details  Name: Tammy Neal MRN: [id number] Date of Birth: 08-Oct-1950  R THA on 08-11-21 DCP:  Home with dtrs DME:  No needs, has a RW PT:  HEP                   DME Arranged:  N/A DME Agency:  NA  HH Arranged:  NA HH Agency:  NA  Additional Comments: Please contact me with any questions of if this plan should need to change.  Marianne Sofia, RN,CCM [facility]  [phone] 08/12/2021, 7:46 AM

## 2021-08-12 NOTE — Progress Notes (Signed)
Physical Therapy Treatment Patient Details Name: Tammy Neal MRN: [id number] DOB: 02/06/1951 Today's Date: 08/12/2021   History of Present Illness Pt is a 71 year old female s/p Rt THA direct anterior approach on 08/11/21 with PMHx significant for but not limited to L THA (2018) and mild cognitive impairment    PT Comments    Pt requesting to use bathroom on arrival so assisted into bathroom.  Pt then able to ambulate into hallway however limited by nausea and pain (RN aware).  Pt brought back into room in recliner, assisted with transfer back to bed, and repositioned in bed.  Pt with swollen left hand from IV (removed by RN beginning of session) so educated family present to have pt keep hand elevated on pillow above heart.     Recommendations for follow up therapy are one component of a multi-disciplinary discharge planning process, led by the attending physician.  Recommendations may be updated based on patient status, additional functional criteria and insurance authorization.  Follow Up Recommendations  Follow physician's recommendations for discharge plan and follow up therapies     Assistance Recommended at Discharge Frequent or constant Supervision/Assistance  Patient can return home with the following A lot of help with walking and/or transfers;A lot of help with bathing/dressing/bathroom;Help with stairs or ramp for entrance;Assistance with cooking/housework;Assist for transportation   Equipment Recommendations  None recommended by PT    Recommendations for Other Services       Precautions / Restrictions Precautions Precautions: Fall Restrictions Other Position/Activity Restrictions: WBAT     Mobility  Bed Mobility Overal bed mobility: Needs Assistance Bed Mobility: Supine to Sit, Sit to Supine     Supine to sit: Mod assist, HOB elevated Sit to supine: Mod assist   General bed mobility comments: assist for R LE and trunk upright, assist for bil LEs onto bed     Transfers Overall transfer level: Needs assistance Equipment used: Rolling walker (2 wheels) Transfers: Sit to/from Stand Sit to Stand: Min assist Stand pivot transfers: Min assist         General transfer comment: assist for rise and steady, cues for hand placement    Ambulation/Gait Ambulation/Gait assistance: Min assist Gait Distance (Feet): 75 Feet Assistive device: Rolling walker (2 wheels) Gait Pattern/deviations: Step-to pattern, Decreased stance time - right, Antalgic       General Gait Details: verbal cues for sequence, RW positioning, step length, posture; constant cueing to use UEs through RW for pain control; pt limited by nausea and pain- recliner provided   Stairs             Wheelchair Mobility    Modified Rankin (Stroke Patients Only)       Balance                                            Cognition Arousal/Alertness: Awake/alert Behavior During Therapy: WFL for tasks assessed/performed Overall Cognitive Status: History of cognitive impairments - at baseline                                          Exercises      General Comments        Pertinent Vitals/Pain Pain Assessment Pain Assessment: 0-10 Pain Score: 5  Pain Location: right groin Pain  Descriptors / Indicators: Sore, Aching Pain Intervention(s): Repositioned, Premedicated before session, Monitored during session    Home Living                          Prior Function            PT Goals (current goals can now be found in the care plan section) Progress towards PT goals: Progressing toward goals    Frequency    7X/week      PT Plan Current plan remains appropriate    Co-evaluation              AM-PAC PT "6 Clicks" Mobility   Outcome Measure  Help needed turning from your back to your side while in a flat bed without using bedrails?: A Lot Help needed moving from lying on your back to sitting on the  side of a flat bed without using bedrails?: A Lot Help needed moving to and from a bed to a chair (including a wheelchair)?: A Lot Help needed standing up from a chair using your arms (e.g., wheelchair or bedside chair)?: A Lot Help needed to walk in hospital room?: A Lot Help needed climbing 3-5 steps with a railing? : Total 6 Click Score: 11    End of Session Equipment Utilized During Treatment: Gait belt Activity Tolerance: Patient limited by pain (nausea) Patient left: in bed;with call bell/phone within reach;with bed alarm set;with family/visitor present Nurse Communication: Mobility status PT Visit Diagnosis: Other abnormalities of gait and mobility (R26.89);Pain Pain - Right/Left: Right Pain - part of body: Hip     Time: 6294-7654 PT Time Calculation (min) (ACUTE ONLY): 26 min  Charges:  $Gait Training: 23-37 mins                    Jannette Spanner PT, DPT Acute Rehabilitation Services Pager: [phone] Office: [place] 08/12/2021, 4:22 PM

## 2021-08-12 NOTE — Anesthesia Postprocedure Evaluation (Signed)
Anesthesia Post Note  Patient: Tammy Neal  Procedure(s) Performed: TOTAL HIP ARTHROPLASTY ANTERIOR APPROACH (Right: Hip)     Patient location during evaluation: PACU Anesthesia Type: General Level of consciousness: awake Pain management: pain level controlled Vital Signs Assessment: post-procedure vital signs reviewed and stable Respiratory status: spontaneous breathing, nonlabored ventilation, respiratory function stable and patient connected to nasal cannula oxygen Cardiovascular status: blood pressure returned to baseline and stable Postop Assessment: no apparent nausea or vomiting Anesthetic complications: no   No notable events documented.  Last Vitals:  Vitals:   08/12/21 0550 08/12/21 1348  BP: 120/67 109/61  Pulse: 79 69  Resp: 17 17  Temp: 37.2 C 37.1 C  SpO2: 91% (!) 88%    Last Pain:  Vitals:   08/12/21 1402  TempSrc:   PainSc: 6                  Arnesha Schiraldi P Christalyn Goertz

## 2021-08-12 NOTE — Progress Notes (Signed)
   Subjective: 1 Day Post-Op Procedure(s) (LRB): TOTAL HIP ARTHROPLASTY ANTERIOR APPROACH (Right) Patient seen in rounds by Dr. Wynelle Link. Patient reports pain as mild.   Patient is well, and has had no acute complaints or problems. Denies SOB or chest pain.  We will start physical therapy today.   Objective: Vital signs in last 24 hours: Temp:  [97.4 F (36.3 C)-98.9 F (37.2 C)] 98.9 F (37.2 C) (06/22 0550) Pulse Rate:  [55-83] 79 (06/22 0550) Resp:  [11-20] 17 (06/22 0550) BP: (106-136)/(36-83) 120/67 (06/22 0550) SpO2:  [91 %-100 %] 91 % (06/22 0550) Weight:  [61.2 kg] 61.2 kg (06/21 1154)  Intake/Output from previous day:  Intake/Output Summary (Last 24 hours) at 08/12/2021 0757 Last data filed at 08/12/2021 9326 Gross per 24 hour  Intake 1805.42 ml  Output 750 ml  Net 1055.42 ml     Intake/Output this shift: No intake/output data recorded.  Labs: Recent Labs    08/12/21 0343  HGB 12.9   Recent Labs    08/12/21 0343  WBC 17.6*  RBC 4.06  HCT 40.2  PLT 213   Recent Labs    08/12/21 0343  NA 141  K 3.9  CL 107  CO2 25  BUN 12  CREATININE 0.90  GLUCOSE 174*  CALCIUM 8.5*   No results for input(s): "LABPT", "INR" in the last 72 hours.  Exam: General - Patient is Alert and Oriented Extremity - Neurologically intact Neurovascular intact Sensation intact distally Dorsiflexion/Plantar flexion intact Dressing - dressing C/D/I Motor Function - intact, moving foot and toes well on exam.  Past Medical History:  Diagnosis Date   Allergy    allergic rhinitis   Anxiety    Arthritis    COPD (chronic obstructive pulmonary disease) (HCC)    GERD (gastroesophageal reflux disease)    Hyperlipidemia    no meds   Hypertension    Hypothyroidism    Neck pain, chronic    with fusion   Osteoporosis    Psoriasis     Assessment/Plan: 1 Day Post-Op Procedure(s) (LRB): TOTAL HIP ARTHROPLASTY ANTERIOR APPROACH (Right) Principal Problem:   OA  (osteoarthritis) of hip Active Problems:   Osteoarthritis of right hip  Estimated body mass index is 24.69 kg/m as calculated from the following:   Height as of this encounter: '5\' 2"'$  (1.575 m).   Weight as of this encounter: 61.2 kg. Advance diet Up with therapy D/C IV fluids  DVT Prophylaxis - Xarelto Weight bearing as tolerated.  Start physical therapy. Expected discharge today if meeting patient goals and pain well managed. Plan is to go Home after hospital stay. Will do a HEP once discharged. Follow-up in clinic in 2 weeks.  The PDMP database was reviewed today prior to any opioid medications being prescribed to this patient.  R. Jaynie Bream, PA-C Orthopedic Surgery [phone] 08/12/2021, 7:57 AM

## 2021-08-12 NOTE — TOC Transition Note (Signed)
Transition of Care [facility]) - CM/SW Discharge Note  Patient Details  Name: Tammy Neal MRN: [id number] Date of Birth: 1950-05-20  [facility] CM/SW Contact:  Sherie Don, LCSW Phone Number: 08/12/2021, 10:01 AM  Clinical Narrative: Patient is expected to discharge home after working with PT. CSW met with patient to confirm discharge plan. Patient will go home with a home exercise program (HEP). Patient has a rolling walker at home, so there are no DME needs at this time. TOC signing off.  Final next level of care: Home/Self Care Barriers to Discharge: No Barriers Identified  Patient Goals and CMS Choice Patient states their goals for this hospitalization and ongoing recovery are:: Discharge home with HEP Choice offered to / list presented to : NA  Discharge Plan and Services         DME Arranged: N/A DME Agency: NA HH Arranged: NA [place] Agency: NA  Readmission Risk Interventions     No data to display

## 2021-08-12 NOTE — Evaluation (Signed)
Physical Therapy Evaluation Patient Details Name: Tammy Neal MRN: [id number] DOB: 1950/07/01 Today's Date: 08/12/2021  History of Present Illness  Pt is a 71 year old female s/p Rt THA direct anterior approach on 08/11/21 with PMHx significant for but not limited to L THA (2018) and mild cognitive impairment  Clinical Impression  Pt is s/p THA resulting in the deficits listed below (see PT Problem List).  Pt will benefit from skilled PT to increase their independence and safety with mobility to allow discharge to the venue listed below.   Pt assisted to [facility] per request and then to recliner.  Pt appears in significant pain however only reports 4/10.  RN into give pain meds end of session.  Pt plans to return home with daughters switching out for 24/7 care.         Recommendations for follow up therapy are one component of a multi-disciplinary discharge planning process, led by the attending physician.  Recommendations may be updated based on patient status, additional functional criteria and insurance authorization.  Follow Up Recommendations Follow physician's recommendations for discharge plan and follow up therapies      Assistance Recommended at Discharge Frequent or constant Supervision/Assistance  Patient can return home with the following  A lot of help with walking and/or transfers;A lot of help with bathing/dressing/bathroom;Help with stairs or ramp for entrance;Assistance with cooking/housework;Assist for transportation    Equipment Recommendations None recommended by PT  Recommendations for Other Services       Functional Status Assessment Patient has had a recent decline in their functional status and demonstrates the ability to make significant improvements in function in a reasonable and predictable amount of time.     Precautions / Restrictions Precautions Precautions: Fall Restrictions Weight Bearing Restrictions: No Other Position/Activity Restrictions: WBAT       Mobility  Bed Mobility Overal bed mobility: Needs Assistance Bed Mobility: Supine to Sit     Supine to sit: Mod assist, HOB elevated     General bed mobility comments: assist for R LE and trunk upright, pt moaning in pain however only reports 4/10 pain    Transfers Overall transfer level: Needs assistance Equipment used: Rolling walker (2 wheels) Transfers: Sit to/from Stand, Bed to chair/wheelchair/BSC Sit to Stand: Min assist Stand pivot transfers: Min assist         General transfer comment: assist for rise and steady; pt assisted to [facility] and then 180* turn to recliner, unable to tolerate ambulating at this time due to pain    Ambulation/Gait                  Stairs            Wheelchair Mobility    Modified Rankin (Stroke Patients Only)       Balance                                             Pertinent Vitals/Pain Pain Assessment Pain Assessment: 0-10 Pain Score: 4  Pain Location: right groin Pain Descriptors / Indicators: Sore, Aching Pain Intervention(s): Monitored during session, Repositioned    Home Living Family/patient expects to be discharged to:: Private residence Living Arrangements: Children Available Help at Discharge: Family Type of Home: House Home Access: Stairs to enter Entrance Stairs-Rails: Right Entrance Stairs-Number of Steps: 2   Home Layout: One level Home Equipment: Kasandra Knudsen -  single point;Rolling Walker (2 wheels);Shower seat      Prior Function Prior Level of Function : Independent/Modified Independent             Mobility Comments: using SPC prior to surgery       Hand Dominance        Extremity/Trunk Assessment        Lower Extremity Assessment Lower Extremity Assessment: RLE deficits/detail RLE Deficits / Details: requiring assist for R LE due to pain, anticipated post op hip weakness observed       Communication   Communication: No difficulties  Cognition  Arousal/Alertness: Awake/alert Behavior During Therapy: WFL for tasks assessed/performed Overall Cognitive Status: History of cognitive impairments - at baseline                                          General Comments      Exercises     Assessment/Plan    PT Assessment Patient needs continued PT services  PT Problem List Decreased strength;Decreased activity tolerance;Decreased balance;Decreased knowledge of precautions;Decreased mobility;Decreased knowledge of use of DME;Pain;Decreased safety awareness       PT Treatment Interventions Stair training;Gait training;Balance training;DME instruction;Therapeutic exercise;Functional mobility training;Therapeutic activities;Patient/family education    PT Goals (Current goals can be found in the Care Plan section)  Acute Rehab PT Goals PT Goal Formulation: With patient/family Time For Goal Achievement: 08/19/21 Potential to Achieve Goals: Good    Frequency 7X/week     Co-evaluation               AM-PAC PT "6 Clicks" Mobility  Outcome Measure Help needed turning from your back to your side while in a flat bed without using bedrails?: A Lot Help needed moving from lying on your back to sitting on the side of a flat bed without using bedrails?: A Lot Help needed moving to and from a bed to a chair (including a wheelchair)?: A Lot Help needed standing up from a chair using your arms (e.g., wheelchair or bedside chair)?: A Lot Help needed to walk in hospital room?: A Lot Help needed climbing 3-5 steps with a railing? : Total 6 Click Score: 11    End of Session Equipment Utilized During Treatment: Gait belt Activity Tolerance: Patient limited by pain Patient left: in chair;with call bell/phone within reach;with family/visitor present;with nursing/sitter in room Nurse Communication: Mobility status PT Visit Diagnosis: Other abnormalities of gait and mobility (R26.89);Pain Pain - Right/Left: Right Pain -  part of body: Hip    Time: 9470-9628 PT Time Calculation (min) (ACUTE ONLY): 21 min   Charges:   PT Evaluation $PT Eval Low Complexity: 1 Low        Kati PT, DPT Acute Rehabilitation Services Pager: [phone] Office: [phone]   Tammy Neal 08/12/2021, 11:33 AM

## 2021-08-13 ENCOUNTER — Other Ambulatory Visit: Payer: Self-pay

## 2021-08-13 DIAGNOSIS — Z96642 Presence of left artificial hip joint: Secondary | ICD-10-CM | POA: Diagnosis not present

## 2021-08-13 DIAGNOSIS — E039 Hypothyroidism, unspecified: Secondary | ICD-10-CM | POA: Diagnosis not present

## 2021-08-13 DIAGNOSIS — M1611 Unilateral primary osteoarthritis, right hip: Secondary | ICD-10-CM | POA: Diagnosis not present

## 2021-08-13 DIAGNOSIS — Z87891 Personal history of nicotine dependence: Secondary | ICD-10-CM | POA: Diagnosis not present

## 2021-08-13 DIAGNOSIS — I10 Essential (primary) hypertension: Secondary | ICD-10-CM | POA: Diagnosis not present

## 2021-08-13 DIAGNOSIS — Z79899 Other long term (current) drug therapy: Secondary | ICD-10-CM | POA: Diagnosis not present

## 2021-08-13 DIAGNOSIS — J449 Chronic obstructive pulmonary disease, unspecified: Secondary | ICD-10-CM | POA: Diagnosis not present

## 2021-08-13 LAB — CBC
HCT: 36 % (ref 36.0–46.0)
Hemoglobin: 11.4 g/dL — ABNORMAL LOW (ref 12.0–15.0)
MCH: 31.4 pg (ref 26.0–34.0)
MCHC: 31.7 g/dL (ref 30.0–36.0)
MCV: 99.2 fL (ref 80.0–100.0)
Platelets: 186 10*3/uL (ref 150–400)
RBC: 3.63 MIL/uL — ABNORMAL LOW (ref 3.87–5.11)
RDW: 13.1 % (ref 11.5–15.5)
WBC: 19.9 10*3/uL — ABNORMAL HIGH (ref 4.0–10.5)
nRBC: 0 % (ref 0.0–0.2)

## 2021-08-27 ENCOUNTER — Ambulatory Visit (INDEPENDENT_AMBULATORY_CARE_PROVIDER_SITE_OTHER): Payer: Medicare HMO

## 2021-08-27 VITALS — Ht 62.0 in | Wt 135.0 lb

## 2021-08-27 DIAGNOSIS — Z0000 Encounter for general adult medical examination without abnormal findings: Secondary | ICD-10-CM | POA: Diagnosis not present

## 2021-08-27 NOTE — Patient Instructions (Signed)
Tammy Neal , Thank you for taking time to come for your Medicare Wellness Visit. I appreciate your ongoing commitment to your health goals. Please review the following plan we discussed and let me know if I can assist you in the future.   Screening recommendations/referrals: Colonoscopy: completed 09/27/2013 Mammogram: completed 08/28/2020, due 08/29/2021 Bone Density: completed 01/07/2016 Recommended yearly ophthalmology/optometry visit for glaucoma screening and checkup Recommended yearly dental visit for hygiene and checkup  Vaccinations: Influenza vaccine: due 09/21/2021 Pneumococcal vaccine: completed 10/30/2015 Tdap vaccine: due Shingles vaccine: discussed   Covid-19: 05/14/2019, 04/21/2019  Advanced directives: Please bring a copy of your POA (Power of Attorney) and/or Living Will to your next appointment.   Conditions/risks identified: none  Next appointment: Follow up in one year for your annual wellness visit    Preventive Care 65 Years and Older, Female Preventive care refers to lifestyle choices and visits with your health care provider that can promote health and wellness. What does preventive care include? A yearly physical exam. This is also called an annual well check. Dental exams once or twice a year. Routine eye exams. Ask your health care provider how often you should have your eyes checked. Personal lifestyle choices, including: Daily care of your teeth and gums. Regular physical activity. Eating a healthy diet. Avoiding tobacco and drug use. Limiting alcohol use. Practicing safe sex. Taking low-dose aspirin every day. Taking vitamin and mineral supplements as recommended by your health care provider. What happens during an annual well check? The services and screenings done by your health care provider during your annual well check will depend on your age, overall health, lifestyle risk factors, and family history of disease. Counseling  Your health care provider  may ask you questions about your: Alcohol use. Tobacco use. Drug use. Emotional well-being. Home and relationship well-being. Sexual activity. Eating habits. History of falls. Memory and ability to understand (cognition). Work and work Statistician. Reproductive health. Screening  You may have the following tests or measurements: Height, weight, and BMI. Blood pressure. Lipid and cholesterol levels. These may be checked every 5 years, or more frequently if you are over 13 years old. Skin check. Lung cancer screening. You may have this screening every year starting at age 32 if you have a 30-pack-year history of smoking and currently smoke or have quit within the past 15 years. Fecal occult blood test (FOBT) of the stool. You may have this test every year starting at age 72. Flexible sigmoidoscopy or colonoscopy. You may have a sigmoidoscopy every 5 years or a colonoscopy every 10 years starting at age 72. Hepatitis C blood test. Hepatitis B blood test. Sexually transmitted disease (STD) testing. Diabetes screening. This is done by checking your blood sugar (glucose) after you have not eaten for a while (fasting). You may have this done every 1-3 years. Bone density scan. This is done to screen for osteoporosis. You may have this done starting at age 72. Mammogram. This may be done every 1-2 years. Talk to your health care provider about how often you should have regular mammograms. Talk with your health care provider about your test results, treatment options, and if necessary, the need for more tests. Vaccines  Your health care provider may recommend certain vaccines, such as: Influenza vaccine. This is recommended every year. Tetanus, diphtheria, and acellular pertussis (Tdap, Td) vaccine. You may need a Td booster every 10 years. Zoster vaccine. You may need this after age 72. Pneumococcal 13-valent conjugate (PCV13) vaccine. One dose is recommended  after age 72. Pneumococcal  polysaccharide (PPSV23) vaccine. One dose is recommended after age 72. Talk to your health care provider about which screenings and vaccines you need and how often you need them. This information is not intended to replace advice given to you by your health care provider. Make sure you discuss any questions you have with your health care provider. Document Released: 03/06/2015 Document Revised: 10/28/2015 Document Reviewed: 12/09/2014 Elsevier Interactive Patient Education  2017 [place] Prevention in the Home Falls can cause injuries. They can happen to people of all ages. There are many things you can do to make your home safe and to help prevent falls. What can I do on the outside of my home? Regularly fix the edges of walkways and driveways and fix any cracks. Remove anything that might make you trip as you walk through a door, such as a raised step or threshold. Trim any bushes or trees on the path to your home. Use bright outdoor lighting. Clear any walking paths of anything that might make someone trip, such as rocks or tools. Regularly check to see if handrails are loose or broken. Make sure that both sides of any steps have handrails. Any raised decks and porches should have guardrails on the edges. Have any leaves, snow, or ice cleared regularly. Use sand or salt on walking paths during winter. Clean up any spills in your garage right away. This includes oil or grease spills. What can I do in the bathroom? Use night lights. Install grab bars by the toilet and in the tub and shower. Do not use towel bars as grab bars. Use non-skid mats or decals in the tub or shower. If you need to sit down in the shower, use a plastic, non-slip stool. Keep the floor dry. Clean up any water that spills on the floor as soon as it happens. Remove soap buildup in the tub or shower regularly. Attach bath mats securely with double-sided non-slip rug tape. Do not have throw rugs and other  things on the floor that can make you trip. What can I do in the bedroom? Use night lights. Make sure that you have a light by your bed that is easy to reach. Do not use any sheets or blankets that are too big for your bed. They should not hang down onto the floor. Have a firm chair that has side arms. You can use this for support while you get dressed. Do not have throw rugs and other things on the floor that can make you trip. What can I do in the kitchen? Clean up any spills right away. Avoid walking on wet floors. Keep items that you use a lot in easy-to-reach places. If you need to reach something above you, use a strong step stool that has a grab bar. Keep electrical cords out of the way. Do not use floor polish or wax that makes floors slippery. If you must use wax, use non-skid floor wax. Do not have throw rugs and other things on the floor that can make you trip. What can I do with my stairs? Do not leave any items on the stairs. Make sure that there are handrails on both sides of the stairs and use them. Fix handrails that are broken or loose. Make sure that handrails are as long as the stairways. Check any carpeting to make sure that it is firmly attached to the stairs. Fix any carpet that is loose or worn. Avoid having throw  rugs at the top or bottom of the stairs. If you do have throw rugs, attach them to the floor with carpet tape. Make sure that you have a light switch at the top of the stairs and the bottom of the stairs. If you do not have them, ask someone to add them for you. What else can I do to help prevent falls? Wear shoes that: Do not have high heels. Have rubber bottoms. Are comfortable and fit you well. Are closed at the toe. Do not wear sandals. If you use a stepladder: Make sure that it is fully opened. Do not climb a closed stepladder. Make sure that both sides of the stepladder are locked into place. Ask someone to hold it for you, if possible. Clearly  mark and make sure that you can see: Any grab bars or handrails. First and last steps. Where the edge of each step is. Use tools that help you move around (mobility aids) if they are needed. These include: Canes. Walkers. Scooters. Crutches. Turn on the lights when you go into a dark area. Replace any light bulbs as soon as they burn out. Set up your furniture so you have a clear path. Avoid moving your furniture around. If any of your floors are uneven, fix them. If there are any pets around you, be aware of where they are. Review your medicines with your doctor. Some medicines can make you feel dizzy. This can increase your chance of falling. Ask your doctor what other things that you can do to help prevent falls. This information is not intended to replace advice given to you by your health care provider. Make sure you discuss any questions you have with your health care provider. Document Released: 12/04/2008 Document Revised: 07/16/2015 Document Reviewed: 03/14/2014 Elsevier Interactive Patient Education  2017 [employer].

## 2021-08-27 NOTE — Progress Notes (Signed)
I connected with Tammy Neal today by telephone and verified that I am speaking with the correct person using two identifiers. Location patient: home Location provider: work Persons participating in the virtual visit: Tammy Neal, Tammy Neal (daughter), Glenna Durand LPN.   I discussed the limitations, risks, security and privacy concerns of performing an evaluation and management service by telephone and the availability of in person appointments. I also discussed with the patient that there may be a patient responsible charge related to this service. The patient expressed understanding and verbally consented to this telephonic visit.    Interactive audio and video telecommunications were attempted between this provider and patient, however failed, due to patient having technical difficulties OR patient did not have access to video capability.  We continued and completed visit with audio only.     Vital signs may be patient reported or missing.  Subjective:   Tammy Neal is a 71 y.o. female who presents for Medicare Annual (Subsequent) preventive examination.  Review of Systems     Cardiac Risk Factors include: advanced age (>14mn, >>43women);dyslipidemia;hypertension     Objective:    Today's Vitals   08/27/21 1426  Weight: 135 lb (61.2 kg)  Height: '5\' 2"'$  (1.575 m)   Body mass index is 24.69 kg/m.     08/27/2021    2:35 PM 08/11/2021    6:00 PM 07/30/2021   10:04 AM 11/16/2016    5:18 PM 11/16/2016    8:14 AM 11/07/2016   10:21 AM 01/13/2016   10:52 AM  Advanced Directives  Does Patient Have a Medical Advance Directive? Yes Yes Yes  Yes Yes Yes  Type of [place]amed[place] will Healthcare Power of [place] will Healthcare Power of [place]lineof [place]of [place] will  Does patient want to make changes to medical advance directive?   No - Patient declined  No - Patient declined  No - Patient declined   Copy of [place] Chart? No - copy requested    No - copy requested No - copy requested No - copy requested    Current Medications (verified) Outpatient Encounter Medications as of 08/27/2021  Medication Sig   acetaminophen (TYLENOL) 160 MG/5ML liquid Take 480 mg by mouth every 4 (four) hours as needed for pain.   amLODipine (NORVASC) 5 MG tablet TAKE 1 TABLET BY MOUTH ONCE A DAY   benzonatate (TESSALON) 200 MG capsule Take 1 capsule (200 mg total) by mouth 3 (three) times daily as needed.   busPIRone (BUSPAR) 15 MG tablet TAKE 1/4 TABLET BY MOUTH TWICE A DAY (Patient taking differently: Take 5 mg by mouth in the morning and at bedtime.)   Cyanocobalamin (VITAMIN B-12 PO) Take 3,000 mcg by mouth in the morning. Gummies   famotidine (PEPCID) 20 MG tablet Take 1 tablet (20 mg total) by mouth daily.   levothyroxine (SYNTHROID) 50 MCG tablet TAKE 1 TABLET BY MOUTH ONCE A DAY BEFOREBREAKFAST.   memantine (NAMENDA) 10 MG tablet Take 1 tablet (10 mg total) by mouth 2 (two) times daily.   nystatin cream (MYCOSTATIN) Apply 1 application. topically 2 (two) times daily as needed for dry skin (yeast rash).   rivaroxaban (XARELTO) 10 MG TABS tablet Take 1 tablet (10 mg total) by mouth daily with breakfast for 20 days.   rosuvastatin (CRESTOR) 10 MG tablet TAKE 1 TABLET BY MOUTH TWICE WEEKLY (Patient taking differently: Take 10  mg by mouth 2 (two) times a week. Tuesdays & Saturdays)   HYDROcodone-acetaminophen (NORCO/VICODIN) 5-325 MG tablet Take 1-2 tablets by mouth every 6 (six) hours as needed for severe pain. (Patient not taking: Reported on 08/27/2021)   methocarbamol (ROBAXIN) 500 MG tablet Take 1 tablet (500 mg total) by mouth every 6 (six) hours as needed for muscle spasms. (Patient not taking: Reported on 08/27/2021)   traMADol (ULTRAM) 50 MG tablet Take 1-2 tablets (50-100 mg total) by mouth every 6 (six) hours as  needed for moderate pain. (Patient not taking: Reported on 08/27/2021)   No facility-administered encounter medications on file as of 08/27/2021.    Allergies (verified) Voltaren [diclofenac sodium], Aricept [donepezil hcl], Aspirin, Lipitor [atorvastatin], and Zocor [simvastatin]   History: Past Medical History:  Diagnosis Date   Allergy    allergic rhinitis   Anxiety    Arthritis    COPD (chronic obstructive pulmonary disease) (HCC)    GERD (gastroesophageal reflux disease)    Hyperlipidemia    no meds   Hypertension    Hypothyroidism    Neck pain, chronic    with fusion   Osteoporosis    Psoriasis    Past Surgical History:  Procedure Laterality Date   ABDOMINAL HYSTERECTOMY     endometriosis 1 ovary left   APPENDECTOMY     NECK SURGERY     SPINE SURGERY  1977   cervical fusion Dr Carloyn Manner x2   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   Procedure: LEFT TOTAL HIP ARTHROPLASTY ANTERIOR APPROACH;  Surgeon: Gaynelle Arabian, MD;  Location: [facility];  Service: Orthopedics;  Laterality: Left;   TOTAL HIP ARTHROPLASTY Right 08/11/2021   Procedure: TOTAL HIP ARTHROPLASTY ANTERIOR APPROACH;  Surgeon: Gaynelle Arabian, MD;  Location: [facility];  Service: Orthopedics;  Laterality: Right;   Family History  Problem Relation Age of Onset   Hypertension Mother    Diabetes Mother    Stroke Mother 22   Asthma Father    COPD Father    Colon cancer Neg Hx    Esophageal cancer Neg Hx    Ulcerative colitis Neg Hx    Stomach cancer Neg Hx    Social History   Socioeconomic History   Marital status: Divorced    Spouse name: Not on file   Number of children: Not on file   Years of education: Not on file   Highest education level: Not on file  Occupational History   Not on file  Tobacco Use   Smoking status: Former    Years: 1.00    Types: Cigarettes   Smokeless tobacco: Never  Vaping Use   Vaping Use: Never used  Substance and Sexual Activity   Alcohol use: No    Alcohol/week: 0.0 standard  drinks of alcohol   Drug use: No   Sexual activity: Never  Other Topics Concern   Not on file  Social History Narrative   Not on file   Social Determinants of Health   Financial Resource Strain: Low Risk  (08/27/2021)   Overall Financial Resource Strain (CARDIA)    Difficulty of Paying Living Expenses: Not hard at all  Food Insecurity: No Food Insecurity (08/27/2021)   Hunger Vital Sign    Worried About Running Out of Food in the Last Year: Never true    [place] in the Last Year: Never true  Transportation Needs: No Transportation Needs (08/27/2021)   PRAPARE - Hydrologist (Medical): No  Lack of Transportation (Non-Medical): No  Physical Activity: Inactive (08/27/2021)   Exercise Vital Sign    Days of Exercise per Week: 0 days    Minutes of Exercise per Session: 0 min  Stress: No Stress Concern Present (08/27/2021)   [place]    Feeling of Stress : Not at all  Social Connections: Not on file    Tobacco Counseling Counseling given: Not Answered   Clinical Intake:  Pre-visit preparation completed: Yes  Pain : 0-10 Pain Type: Acute pain Pain Location:  (surgical scar) Pain Descriptors / Indicators: Sore, Throbbing Pain Onset: 1 to 4 weeks ago Pain Frequency: Intermittent     Nutritional Status: BMI 25 -29 Overweight Nutritional Risks: None Diabetes: No  How often do you need to have someone help you when you read instructions, pamphlets, or other written materials from your doctor or pharmacy?: 1 - Never What is the last grade level you completed in school?: 12th grade  Diabetic? no  Interpreter Needed?: No  Information entered by :: NAllen LPN   Activities of Daily Living    08/27/2021    2:36 PM 08/11/2021    5:50 PM  In your present state of health, do you have any difficulty performing the following activities:  Hearing? 0 0  Vision? 0 0  Difficulty  concentrating or making decisions? 1 1  Walking or climbing stairs? 0 0  Dressing or bathing? 0 0  Doing errands, shopping? 1 0  Preparing Food and eating ? N   Using the Toilet? N   In the past six months, have you accidently leaked urine? N   Do you have problems with loss of bowel control? N   Managing your Medications? Y   Managing your Finances? Y   Housekeeping or managing your Housekeeping? Y     Patient Care Team: [place], Wynelle Fanny, MD as PCP - General Lenord Fellers Cleaster Corin, [facility] as Pharmacist (Pharmacist)  Indicate any recent Medical Services you may have received from other than [facility] providers in the past year (date may be approximate).     Assessment:   This is a routine wellness examination for [place].  Hearing/Vision screen Vision Screening - Comments:: No regular eye exams,  Dietary issues and exercise activities discussed: Current Exercise Habits: The patient does not participate in regular exercise at present   Goals Addressed             This Visit's Progress    Patient Stated       08/27/2021, no goals       Depression Screen    08/27/2021    2:36 PM 12/29/2020    3:15 PM 04/16/2019   10:59 AM 04/16/2019   10:45 AM 01/13/2016   10:52 AM  PHQ 2/9 Scores  PHQ - 2 Score 0 0 0 0 0  PHQ- 9 Score   0 0     Fall Risk    08/27/2021    2:35 PM 07/05/2021   11:39 AM 06/22/2020    3:35 PM 01/13/2016   10:52 AM  Fall Risk   Falls in the past year? 0 0 0 No  Number falls in past yr: 0     Injury with Fall? 0     Risk for fall due to : Impaired balance/gait;Impaired mobility;Medication side effect     Follow up Falls evaluation completed;Education provided;Falls prevention discussed Falls evaluation completed      FALL RISK PREVENTION PERTAINING TO  THE HOME:  Any stairs in or around the home? Yes  If so, are there any without handrails? No  Home free of loose throw rugs in walkways, pet beds, electrical cords, etc? Yes  Adequate lighting in your home to  reduce risk of falls? Yes   ASSISTIVE DEVICES UTILIZED TO PREVENT FALLS:  Life alert? No  Use of a cane, walker or w/c? Yes  Grab bars in the bathroom? Yes  Shower chair or bench in shower? Yes  Elevated toilet seat or a handicapped toilet? Yes   TIMED UP AND GO:  Was the test performed? No .      Cognitive Function:    01/13/2016   11:10 AM  MMSE - Mini Mental State Exam  Orientation to time 3  Orientation to time comments pt was unable to state the current year  Orientation to Place 5  Registration 3  Attention/ Calculation 0  Recall 0  Recall-comments pt was unable to recall 3 of 3 words  Language- name 2 objects 0  Language- repeat 1  Language- follow 3 step command 3  Language- read & follow direction 0  Write a sentence 0  Copy design 0  Total score 15        Immunizations Immunization History  Administered Date(s) Administered   Fluad Quad(high Dose 65+) 05/05/2021   Influenza Split 11/02/2011   Influenza Whole 02/21/1997, 11/22/2006, 11/22/2007   Influenza,inj,Quad PF,6+ Mos 11/25/2014, 10/30/2015, 11/04/2016, 01/04/2018   PFIZER(Purple Top)SARS-COV-2 Vaccination 04/21/2019, 05/14/2019   Pneumococcal Conjugate-13 10/30/2015   Pneumococcal Polysaccharide-23 10/30/2002, 05/03/2010   Td 04/25/2005   Zoster, Live 07/06/2010    TDAP status: Due, Education has been provided regarding the importance of this vaccine. Advised may receive this vaccine at local pharmacy or Health Dept. Aware to provide a copy of the vaccination record if obtained from local pharmacy or Health Dept. Verbalized acceptance and understanding.  Flu Vaccine status: Up to date  Pneumococcal vaccine status: Up to date  Covid-19 vaccine status: Completed vaccines  Qualifies for Shingles Vaccine? Yes   Zostavax completed Yes   Shingrix Completed?: No.    Education has been provided regarding the importance of this vaccine. Patient has been advised to call insurance company to  determine out of pocket expense if they have not yet received this vaccine. Advised may also receive vaccine at local pharmacy or Health Dept. Verbalized acceptance and understanding.  Screening Tests Health Maintenance  Topic Date Due   Zoster Vaccines- Shingrix (1 of 2) Never done   Pneumonia Vaccine 40+ Years old (3 - PPSV23 if available, else PCV20) 10/29/2016   COVID-19 Vaccine (3 - Pfizer series) 07/09/2019   TETANUS/TDAP  04/24/2025 (Originally 04/26/2015)   COLONOSCOPY (Pts 45-75yr Insurance coverage will need to be confirmed)  04/15/2029 (Originally 09/28/2018)   MAMMOGRAM  08/28/2021   INFLUENZA VACCINE  09/21/2021   DEXA SCAN  Completed   Hepatitis C Screening  Completed   HPV VACCINES  Aged Out    Health Maintenance  Health Maintenance Due  Topic Date Due   Zoster Vaccines- Shingrix (1 of 2) Never done   Pneumonia Vaccine 72 Years old (340- PPSV23 if available, else PCV20) 10/29/2016   COVID-19 Vaccine (3 - Pfizer series) 07/09/2019    Colorectal cancer screening: Type of screening: Colonoscopy. Completed 09/27/2013. Repeat every 10 years  Mammogram status: Completed 08/28/2020. Repeat every year  Bone Density status: Completed 01/07/2016.   Lung Cancer Screening: (Low Dose CT Chest recommended if Age 72-80  years, 30 pack-year currently smoking OR have quit w/in 15years.) does not qualify.   Lung Cancer Screening Referral: no  Additional Screening:  Hepatitis C Screening: does qualify; Completed 01/13/2016  Vision Screening: Recommended annual ophthalmology exams for early detection of glaucoma and other disorders of the eye. Is the patient up to date with their annual eye exam?  No  Who is the provider or what is the name of the office in which the patient attends annual eye exams? none If pt is not established with a provider, would they like to be referred to a provider to establish care? No .   Dental Screening: Recommended annual dental exams for proper oral  hygiene  Community Resource Referral / Chronic Care Management: CRR required this visit?  No   CCM required this visit?  No      Plan:     I have personally reviewed and noted the following in the patient's chart:   Medical and social history Use of alcohol, tobacco or illicit drugs  Current medications and supplements including opioid prescriptions.  Functional ability and status Nutritional status Physical activity Advanced directives List of other physicians Hospitalizations, surgeries, and ER visits in previous 12 months Vitals Screenings to include cognitive, depression, and falls Referrals and appointments  In addition, I have reviewed and discussed with patient certain preventive protocols, quality metrics, and best practice recommendations. A written personalized care plan for preventive services as well as general preventive health recommendations were provided to patient.     Kellie Simmering, LPN   03/30/9483   Nurse Notes: 6 CIT not administered. Patient has diagnosis of mild cognitive impairment.  Due to this being a virtual visit, the after visit summary with patients personalized plan was offered to patient via mail or my-chart. Patient would like to access on my-chart

## 2021-09-08 ENCOUNTER — Other Ambulatory Visit: Payer: Medicare HMO

## 2021-09-14 DIAGNOSIS — Z5189 Encounter for other specified aftercare: Secondary | ICD-10-CM | POA: Diagnosis not present

## 2021-09-23 ENCOUNTER — Ambulatory Visit: Payer: Medicare HMO | Admitting: Dermatology

## 2021-09-27 ENCOUNTER — Other Ambulatory Visit: Payer: Medicare HMO

## 2021-09-27 ENCOUNTER — Telehealth: Payer: Self-pay | Admitting: Family Medicine

## 2021-09-27 DIAGNOSIS — E039 Hypothyroidism, unspecified: Secondary | ICD-10-CM

## 2021-09-27 NOTE — Telephone Encounter (Signed)
-----   Message from Ellamae Sia sent at 09/23/2021 11:05 AM EDT ----- Regarding: Lab orders for Tuesday, 8.8.23 Lab orders, comment said, tsh, vit B12 is ordered. Let me know what to draw, both?

## 2021-09-27 NOTE — Telephone Encounter (Signed)
I added the TSH order Thanks

## 2021-09-28 ENCOUNTER — Other Ambulatory Visit (INDEPENDENT_AMBULATORY_CARE_PROVIDER_SITE_OTHER): Payer: Medicare HMO

## 2021-09-28 ENCOUNTER — Other Ambulatory Visit: Payer: Medicare HMO

## 2021-09-28 DIAGNOSIS — E039 Hypothyroidism, unspecified: Secondary | ICD-10-CM

## 2021-09-28 DIAGNOSIS — E538 Deficiency of other specified B group vitamins: Secondary | ICD-10-CM

## 2021-09-28 LAB — TSH: TSH: 1.21 u[IU]/mL (ref 0.35–5.50)

## 2021-09-28 LAB — VITAMIN B12: Vitamin B-12: >1500 pg/mL — ABNORMAL HIGH (ref 211–911)

## 2021-11-04 DIAGNOSIS — M9901 Segmental and somatic dysfunction of cervical region: Secondary | ICD-10-CM | POA: Diagnosis not present

## 2021-11-04 DIAGNOSIS — M542 Cervicalgia: Secondary | ICD-10-CM | POA: Diagnosis not present

## 2021-11-04 DIAGNOSIS — M47892 Other spondylosis, cervical region: Secondary | ICD-10-CM | POA: Diagnosis not present

## 2021-11-11 ENCOUNTER — Other Ambulatory Visit: Payer: Self-pay | Admitting: Family Medicine

## 2021-12-14 DIAGNOSIS — M47892 Other spondylosis, cervical region: Secondary | ICD-10-CM | POA: Diagnosis not present

## 2021-12-14 DIAGNOSIS — M542 Cervicalgia: Secondary | ICD-10-CM | POA: Diagnosis not present

## 2021-12-14 DIAGNOSIS — M9901 Segmental and somatic dysfunction of cervical region: Secondary | ICD-10-CM | POA: Diagnosis not present

## 2021-12-17 ENCOUNTER — Other Ambulatory Visit: Payer: Self-pay | Admitting: Family Medicine

## 2021-12-28 ENCOUNTER — Other Ambulatory Visit: Payer: Self-pay | Admitting: Family Medicine

## 2022-01-04 DIAGNOSIS — Z5189 Encounter for other specified aftercare: Secondary | ICD-10-CM | POA: Diagnosis not present

## 2022-03-01 ENCOUNTER — Other Ambulatory Visit: Payer: Self-pay | Admitting: Family Medicine

## 2022-05-11 ENCOUNTER — Other Ambulatory Visit: Payer: Self-pay | Admitting: Family Medicine

## 2022-05-26 ENCOUNTER — Other Ambulatory Visit: Payer: Self-pay | Admitting: Family Medicine

## 2022-05-27 NOTE — Telephone Encounter (Signed)
Last OV was 07/05/21, and no future appts., please advise

## 2022-05-27 NOTE — Telephone Encounter (Signed)
Refilled for 3 mo Please schedule f/u in may

## 2022-06-09 ENCOUNTER — Other Ambulatory Visit: Payer: Self-pay | Admitting: Family Medicine

## 2022-07-05 ENCOUNTER — Other Ambulatory Visit: Payer: Self-pay | Admitting: Family Medicine

## 2022-07-05 MED ORDER — FAMOTIDINE 20 MG PO TABS
20.0000 mg | ORAL_TABLET | Freq: Every day | ORAL | 0 refills | Status: DC
Start: 2022-07-05 — End: 2023-04-25

## 2022-07-05 MED ORDER — FAMOTIDINE 20 MG PO TABS
20.0000 mg | ORAL_TABLET | Freq: Every day | ORAL | Status: DC
Start: 2022-07-05 — End: 2022-07-05

## 2022-07-05 NOTE — Telephone Encounter (Signed)
Please schedule f/u or annual exam this summer Her choice

## 2022-07-05 NOTE — Telephone Encounter (Signed)
Last OV 07/05/21, no future appts., please advise

## 2022-07-06 NOTE — Telephone Encounter (Signed)
[email or web address]

## 2022-08-24 ENCOUNTER — Encounter: Payer: Medicare HMO | Admitting: Family Medicine

## 2022-08-24 NOTE — Telephone Encounter (Signed)
Patient's transportation fell through, rescheduled to later in the month.

## 2022-09-01 ENCOUNTER — Ambulatory Visit (INDEPENDENT_AMBULATORY_CARE_PROVIDER_SITE_OTHER): Payer: Medicare HMO

## 2022-09-01 VITALS — Ht 62.0 in | Wt 135.0 lb

## 2022-09-01 DIAGNOSIS — Z0000 Encounter for general adult medical examination without abnormal findings: Secondary | ICD-10-CM | POA: Diagnosis not present

## 2022-09-01 NOTE — Patient Instructions (Addendum)
Tammy Neal , Thank you for taking time to come for your Medicare Wellness Visit. I appreciate your ongoing commitment to your health goals. Please review the following plan we discussed and let me know if I can assist you in the future.   These are the goals we discussed:  Goals      Increase water intake     Starting 01/13/2016, I will attempt to drink at least 8 oz of water with each meal daily.      Patient Stated     08/27/2021, no goals     Patient Stated     Patients goal is to remain as active as she can.         This is a list of the screening recommended for you and due dates:  Health Maintenance  Topic Date Due   Zoster (Shingles) Vaccine (1 of 2) 07/22/1999   DTaP/Tdap/Td vaccine (2 - Tdap) 04/26/2015   Pneumonia Vaccine (3 of 3 - PPSV23 or PCV20) 10/29/2016   Mammogram  08/28/2021   COVID-19 Vaccine (3 - 2023-24 season) 10/22/2021   Colon Cancer Screening  04/15/2029*   Flu Shot  09/22/2022   Medicare Annual Wellness Visit  09/01/2023   DEXA scan (bone density measurement)  Completed   Hepatitis C Screening  Completed   HPV Vaccine  Aged Out  *Topic was postponed. The date shown is not the original due date.    Advanced directives: Advance directive discussed with you today. Even though you declined this today, please call our office should you change your mind, and we can give you the proper paperwork for you to fill out. Advance care planning is a way to make decisions about medical care that fits your values in case you are ever unable to make these decisions for yourself.  Information on Advanced Care Planning can be found at [facility] Advance Health Care Directives Advance Health Care Directives ([email or web address]    Conditions/risks identified:  You are due for the vaccines checked below. You may have these done at your preferred pharmacy. Please have them fax the office proof of the vaccines so that we can update your chart.   []  Flu (due  annually) [x]  Shingrix (Shingles vaccine) [x]  Pneumonia Vaccines [x]  TDAP (Tetanus) Vaccine every 10 years [x]  Covid-19   Next appointment: VIRTUAL/TELEPHONE APPOINTMENT Follow up in one year for your annual wellness visit  September 06, 2023 at 2:30 pm telephone visit.   Preventive Care 73 Years and Older, Female Preventive care refers to lifestyle choices and visits with your health care provider that can promote health and wellness. What does preventive care include? A yearly physical exam. This is also called an annual well check. Dental exams once or twice a year. Routine eye exams. Ask your health care provider how often you should have your eyes checked. Personal lifestyle choices, including: Daily care of your teeth and gums. Regular physical activity. Eating a healthy diet. Avoiding tobacco and drug use. Limiting alcohol use. Practicing safe sex. Taking low-dose aspirin every day. Taking vitamin and mineral supplements as recommended by your health care provider. What happens during an annual well check? The services and screenings done by your health care provider during your annual well check will depend on your age, overall health, lifestyle risk factors, and family history of disease. Counseling  Your health care provider may ask you questions about your: Alcohol use. Tobacco use. Drug use. Emotional well-being. Home and relationship well-being. Sexual activity.  Eating habits. History of falls. Memory and ability to understand (cognition). Work and work Astronomer. Reproductive health. Screening  You may have the following tests or measurements: Height, weight, and BMI. Blood pressure. Lipid and cholesterol levels. These may be checked every 5 years, or more frequently if you are over 96 years old. Skin check. Lung cancer screening. You may have this screening every year starting at age 24 if you have a 30-pack-year history of smoking and currently smoke or  have quit within the past 15 years. Fecal occult blood test (FOBT) of the stool. You may have this test every year starting at age 63. Flexible sigmoidoscopy or colonoscopy. You may have a sigmoidoscopy every 5 years or a colonoscopy every 10 years starting at age 53. Hepatitis C blood test. Hepatitis B blood test. Sexually transmitted disease (STD) testing. Diabetes screening. This is done by checking your blood sugar (glucose) after you have not eaten for a while (fasting). You may have this done every 1-3 years. Bone density scan. This is done to screen for osteoporosis. You may have this done starting at age 28. Mammogram. This may be done every 1-2 years. Talk to your health care provider about how often you should have regular mammograms. Talk with your health care provider about your test results, treatment options, and if necessary, the need for more tests. Vaccines  Your health care provider may recommend certain vaccines, such as: Influenza vaccine. This is recommended every year. Tetanus, diphtheria, and acellular pertussis (Tdap, Td) vaccine. You may need a Td booster every 10 years. Zoster vaccine. You may need this after age 68. Pneumococcal 13-valent conjugate (PCV13) vaccine. One dose is recommended after age 87. Pneumococcal polysaccharide (PPSV23) vaccine. One dose is recommended after age 74. Talk to your health care provider about which screenings and vaccines you need and how often you need them. This information is not intended to replace advice given to you by your health care provider. Make sure you discuss any questions you have with your health care provider. Document Released: 03/06/2015 Document Revised: 10/28/2015 Document Reviewed: 12/09/2014 Elsevier Interactive Patient Education  2017 [employer].  Fall Prevention in the Home Falls can cause injuries. They can happen to people of all ages. There are many things you can do to make your home safe and to help  prevent falls. What can I do on the outside of my home? Regularly fix the edges of walkways and driveways and fix any cracks. Remove anything that might make you trip as you walk through a door, such as a raised step or threshold. Trim any bushes or trees on the path to your home. Use bright outdoor lighting. Clear any walking paths of anything that might make someone trip, such as rocks or tools. Regularly check to see if handrails are loose or broken. Make sure that both sides of any steps have handrails. Any raised decks and porches should have guardrails on the edges. Have any leaves, snow, or ice cleared regularly. Use sand or salt on walking paths during winter. Clean up any spills in your garage right away. This includes oil or grease spills. What can I do in the bathroom? Use night lights. Install grab bars by the toilet and in the tub and shower. Do not use towel bars as grab bars. Use non-skid mats or decals in the tub or shower. If you need to sit down in the shower, use a plastic, non-slip stool. Keep the floor dry. Clean up any water  that spills on the floor as soon as it happens. Remove soap buildup in the tub or shower regularly. Attach bath mats securely with double-sided non-slip rug tape. Do not have throw rugs and other things on the floor that can make you trip. What can I do in the bedroom? Use night lights. Make sure that you have a light by your bed that is easy to reach. Do not use any sheets or blankets that are too big for your bed. They should not hang down onto the floor. Have a firm chair that has side arms. You can use this for support while you get dressed. Do not have throw rugs and other things on the floor that can make you trip. What can I do in the kitchen? Clean up any spills right away. Avoid walking on wet floors. Keep items that you use a lot in easy-to-reach places. If you need to reach something above you, use a strong step stool that has a  grab bar. Keep electrical cords out of the way. Do not use floor polish or wax that makes floors slippery. If you must use wax, use non-skid floor wax. Do not have throw rugs and other things on the floor that can make you trip. What can I do with my stairs? Do not leave any items on the stairs. Make sure that there are handrails on both sides of the stairs and use them. Fix handrails that are broken or loose. Make sure that handrails are as long as the stairways. Check any carpeting to make sure that it is firmly attached to the stairs. Fix any carpet that is loose or worn. Avoid having throw rugs at the top or bottom of the stairs. If you do have throw rugs, attach them to the floor with carpet tape. Make sure that you have a light switch at the top of the stairs and the bottom of the stairs. If you do not have them, ask someone to add them for you. What else can I do to help prevent falls? Wear shoes that: Do not have high heels. Have rubber bottoms. Are comfortable and fit you well. Are closed at the toe. Do not wear sandals. If you use a stepladder: Make sure that it is fully opened. Do not climb a closed stepladder. Make sure that both sides of the stepladder are locked into place. Ask someone to hold it for you, if possible. Clearly mark and make sure that you can see: Any grab bars or handrails. First and last steps. Where the edge of each step is. Use tools that help you move around (mobility aids) if they are needed. These include: Canes. Walkers. Scooters. Crutches. Turn on the lights when you go into a dark area. Replace any light bulbs as soon as they burn out. Set up your furniture so you have a clear path. Avoid moving your furniture around. If any of your floors are uneven, fix them. If there are any pets around you, be aware of where they are. Review your medicines with your doctor. Some medicines can make you feel dizzy. This can increase your chance of  falling. Ask your doctor what other things that you can do to help prevent falls. This information is not intended to replace advice given to you by your health care provider. Make sure you discuss any questions you have with your health care provider. Document Released: 12/04/2008 Document Revised: 07/16/2015 Document Reviewed: 03/14/2014 Elsevier Interactive Patient Education  2017 [employer].

## 2022-09-01 NOTE — Progress Notes (Signed)
Visit completed with the help of patients daughter due to patient having dementia.   Daughter states patient had mammogram last year but unable to see report. Please request report. States it was done at [facility], [place] Subjective:   Tammy Neal is a 72 y.o. female who presents for Medicare Annual (Subsequent) preventive examination.  Visit Complete: Virtual  I connected with  Yvanna D Susan on 09/01/22 by a audio enabled telemedicine application and verified that I am speaking with the correct person using two identifiers.  Patient Location: Home  Provider Location: Home Office  I discussed the limitations of evaluation and management by telemedicine. The patient expressed understanding and agreed to proceed.  Patient Medicare AWV questionnaire was completed by the patient on n/a; I have confirmed that all information answered by patient is correct and no changes since this date.  Review of Systems     Cardiac Risk Factors include: advanced age (>2men, >66 women);dyslipidemia;hypertension;sedentary lifestyle;obesity (BMI >30kg/m2)     Objective:    Today's Vitals   09/01/22 1416  Weight: 135 lb (61.2 kg)  Height: 5\' 2"  (1.575 m)   Body mass index is 24.69 kg/m.     09/01/2022    2:16 PM 08/27/2021    2:35 PM 08/11/2021    6:00 PM 07/30/2021   10:04 AM 11/16/2016    5:18 PM 11/16/2016    8:14 AM 11/07/2016   10:21 AM  Advanced Directives  Does Patient Have a Medical Advance Directive? No Yes Yes Yes  Yes Yes  Type of Special educational needs teacher of [place];Living will Healthcare Power of [employer] of [place];Living will Healthcare Power of [employer] Power of [employer] Power of Attorney  Does patient want to make changes to medical advance directive?   No - Patient declined  No - Patient declined  No - Patient declined  Copy of Healthcare Power of Attorney in Chart?  No - copy requested    No - copy requested No - copy  requested  Would patient like information on creating a medical advance directive? No - Patient declined          Current Medications (verified) Outpatient Encounter Medications as of 09/01/2022  Medication Sig   acetaminophen (TYLENOL) 160 MG/5ML liquid Take 480 mg by mouth every 4 (four) hours as needed for pain.   amLODipine (NORVASC) 5 MG tablet TAKE 1 TABLET BY MOUTH ONCE A DAY   busPIRone (BUSPAR) 15 MG tablet TAKE ONE QUARTER (1/4) TABLET BY MOUTH TWICE A DAY   Cyanocobalamin (VITAMIN B-12 PO) Take 3,000 mcg by mouth in the morning. Gummies   famotidine (PEPCID) 20 MG tablet Take 1 tablet (20 mg total) by mouth daily.   levothyroxine (SYNTHROID) 50 MCG tablet TAKE ONE TABLET BY MOUTH ONCE A DAY BEFORE BREAKFAST.   memantine (NAMENDA) 10 MG tablet TAKE ONE TABLET BY MOUTH TWICE A DAY   nystatin cream (MYCOSTATIN) Apply 1 application. topically 2 (two) times daily as needed for dry skin (yeast rash).   rosuvastatin (CRESTOR) 10 MG tablet TAKE 1 TABLET BY MOUTH TWICE WEEKLY   benzonatate (TESSALON) 200 MG capsule Take 1 capsule (200 mg total) by mouth 3 (three) times daily as needed. (Patient not taking: Reported on 09/01/2022)   HYDROcodone-acetaminophen (NORCO/VICODIN) 5-325 MG tablet Take 1-2 tablets by mouth every 6 (six) hours as needed for severe pain. (Patient not taking: Reported on 08/27/2021)   methocarbamol (ROBAXIN) 500 MG tablet Take 1 tablet (500  mg total) by mouth every 6 (six) hours as needed for muscle spasms. (Patient not taking: Reported on 08/27/2021)   traMADol (ULTRAM) 50 MG tablet Take 1-2 tablets (50-100 mg total) by mouth every 6 (six) hours as needed for moderate pain. (Patient not taking: Reported on 08/27/2021)   No facility-administered encounter medications on file as of 09/01/2022.    Allergies (verified) Voltaren [diclofenac sodium], Aricept [donepezil hcl], Aspirin, Lipitor [atorvastatin], and Zocor [simvastatin]   History: Past Medical History:  Diagnosis  Date   Allergy    allergic rhinitis   Anxiety    Arthritis    COPD (chronic obstructive pulmonary disease) (HCC)    GERD (gastroesophageal reflux disease)    Hyperlipidemia    no meds   Hypertension    Hypothyroidism    Neck pain, chronic    with fusion   Osteoporosis    Psoriasis    Past Surgical History:  Procedure Laterality Date   ABDOMINAL HYSTERECTOMY     endometriosis 1 ovary left   APPENDECTOMY     NECK SURGERY     SPINE SURGERY  1977   cervical fusion Dr Channing Mutters x2   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   Procedure: LEFT TOTAL HIP ARTHROPLASTY ANTERIOR APPROACH;  Surgeon: Ollen Gross, MD;  Location: [facility];  Service: Orthopedics;  Laterality: Left;   TOTAL HIP ARTHROPLASTY Right 08/11/2021   Procedure: TOTAL HIP ARTHROPLASTY ANTERIOR APPROACH;  Surgeon: Ollen Gross, MD;  Location: [facility];  Service: Orthopedics;  Laterality: Right;   Family History  Problem Relation Age of Onset   Hypertension Mother    Diabetes Mother    Stroke Mother 37   Asthma Father    COPD Father    Colon cancer Neg Hx    Esophageal cancer Neg Hx    Ulcerative colitis Neg Hx    Stomach cancer Neg Hx    Social History   Socioeconomic History   Marital status: Divorced    Spouse name: Not on file   Number of children: Not on file   Years of education: Not on file   Highest education level: Not on file  Occupational History   Not on file  Tobacco Use   Smoking status: Former    Types: Cigarettes   Smokeless tobacco: Never  Vaping Use   Vaping status: Never Used  Substance and Sexual Activity   Alcohol use: No    Alcohol/week: 0.0 standard drinks of alcohol   Drug use: No   Sexual activity: Never  Other Topics Concern   Not on file  Social History Narrative   Not on file   Social Determinants of Health   Financial Resource Strain: Low Risk  (09/01/2022)   Overall Financial Resource Strain (CARDIA)    Difficulty of Paying Living Expenses: Not hard at all  Food  Insecurity: No Food Insecurity (09/01/2022)   Hunger Vital Sign    Worried About Running Out of Food in the Last Year: Never true    Ran Out of Food in the Last Year: Never true  Transportation Needs: No Transportation Needs (09/01/2022)   PRAPARE - Administrator, Civil Service (Medical): No    Lack of Transportation (Non-Medical): No  Physical Activity: Sufficiently Active (09/01/2022)   Exercise Vital Sign    Days of Exercise per Week: 7 days    Minutes of Exercise per Session: 30 min  Stress: No Stress Concern Present (09/01/2022)   [employer] of Occupational Health - Occupational Stress  Questionnaire    Feeling of Stress : Not at all  Social Connections: Moderately Integrated (09/01/2022)   Social Connection and Isolation Panel [NHANES]    Frequency of Communication with Friends and Family: More than three times a week    Frequency of Social Gatherings with Friends and Family: More than three times a week    Attends Religious Services: More than 4 times per year    Active Member of [employer] or Organizations: Yes    Attends Engineer, structural: More than 4 times per year    Marital Status: Never married    Tobacco Counseling Counseling given: Yes   Clinical Intake:  Pre-visit preparation completed: Yes  Pain : No/denies pain     BMI - recorded: 24.69 Nutritional Status: BMI of 19-24  Normal Nutritional Risks: None Diabetes: No  How often do you need to have someone help you when you read instructions, pamphlets, or other written materials from your doctor or pharmacy?: 1 - Never  Interpreter Needed?: No  Information entered by :: Abby Mykalah Saari, CMA   Activities of Daily Living    09/01/2022    2:25 PM  In your present state of health, do you have any difficulty performing the following activities:  Hearing? 0  Vision? 0  Difficulty concentrating or making decisions? 1  Comment dementia  Walking or climbing stairs? 0  Dressing or  bathing? 0  Doing errands, shopping? 0  Preparing Food and eating ? N  Using the Toilet? N  In the past six months, have you accidently leaked urine? N  Do you have problems with loss of bowel control? N  Managing your Medications? Y  Comment daughters help with medications.  Managing your Finances? N  Comment her daughters are available if she needs help  Housekeeping or managing your Housekeeping? N    Patient Care Team: Tower, Audrie Gallus, MD as PCP - General Foltanski, Garry Heater, [facility] (Inactive) as Pharmacist (Pharmacist)  Indicate any recent Medical Services you may have received from other than [facility] providers in the past year (date may be approximate).     Assessment:   This is a routine wellness examination for Liala.  Hearing/Vision screen Hearing Screening - Comments:: Patient denies any hearing difficulties.    Dietary issues and exercise activities discussed:     Goals Addressed             This Visit's Progress    Patient Stated       Patients goal is to remain as active as she can.        Depression Screen    09/01/2022    2:25 PM 08/27/2021    2:36 PM 12/29/2020    3:15 PM 04/16/2019   10:59 AM 04/16/2019   10:45 AM 01/13/2016   10:52 AM  PHQ 2/9 Scores  PHQ - 2 Score 0 0 0 0 0 0  PHQ- 9 Score    0 0     Fall Risk    09/01/2022    2:24 PM 08/27/2021    2:35 PM 07/05/2021   11:39 AM 06/22/2020    3:35 PM 01/13/2016   10:52 AM  Fall Risk   Falls in the past year? 0 0 0 0 No  Number falls in past yr: 0 0     Injury with Fall? 0 0     Risk for fall due to : No Fall Risks Impaired balance/gait;Impaired mobility;Medication side effect     Follow up  Falls prevention discussed Falls evaluation completed;Education provided;Falls prevention discussed Falls evaluation completed      MEDICARE RISK AT HOME:  Medicare Risk at Home - 09/01/22 1421     Any stairs in or around the home? Yes    If so, are there any without handrails? No    Home free of loose  throw rugs in walkways, pet beds, electrical cords, etc? Yes    Adequate lighting in your home to reduce risk of falls? Yes    Life alert? No    Use of a cane, walker or w/c? No    Grab bars in the bathroom? No    Shower chair or bench in shower? Yes    Elevated toilet seat or a handicapped toilet? No             TIMED UP AND GO:  Was the test performed?  No    Cognitive Function:    01/13/2016   11:10 AM  MMSE - Mini Mental State Exam  Orientation to time 3  Orientation to time comments pt was unable to state the current year  Orientation to Place 5  Registration 3  Attention/ Calculation 0  Recall 0  Recall-comments pt was unable to recall 3 of 3 words  Language- name 2 objects 0  Language- repeat 1  Language- follow 3 step command 3  Language- read & follow direction 0  Write a sentence 0  Copy design 0  Total score 15        09/01/2022    2:22 PM  6CIT Screen  What Year? 4 points  What month? 3 points  What time? 0 points  Count back from 20 0 points  Months in reverse 4 points  Repeat phrase 10 points  Total Score 21 points    Immunizations Immunization History  Administered Date(s) Administered   Fluad Quad(high Dose 65+) 05/05/2021   Influenza Split 11/02/2011   Influenza Whole 02/21/1997, 11/22/2006, 11/22/2007   Influenza,inj,Quad PF,6+ Mos 11/25/2014, 10/30/2015, 11/04/2016, 01/04/2018   PFIZER(Purple Top)SARS-COV-2 Vaccination 04/21/2019, 05/14/2019   Pneumococcal Conjugate-13 10/30/2015   Pneumococcal Polysaccharide-23 10/30/2002, 05/03/2010   Td 04/25/2005   Zoster, Live 07/06/2010    TDAP status: Due, Education has been provided regarding the importance of this vaccine. Advised may receive this vaccine at local pharmacy or Health Dept. Aware to provide a copy of the vaccination record if obtained from local pharmacy or Health Dept. Verbalized acceptance and understanding.  Flu Vaccine status: Up to date  Pneumococcal vaccine status:  Due, Education has been provided regarding the importance of this vaccine. Advised may receive this vaccine at local pharmacy or Health Dept. Aware to provide a copy of the vaccination record if obtained from local pharmacy or Health Dept. Verbalized acceptance and understanding.  Covid-19 vaccine status: Information provided on how to obtain vaccines.   Qualifies for Shingles Vaccine? Yes   Zostavax completed No   Shingrix Completed?: No.    Education has been provided regarding the importance of this vaccine. Patient has been advised to call insurance company to determine out of pocket expense if they have not yet received this vaccine. Advised may also receive vaccine at local pharmacy or Health Dept. Verbalized acceptance and understanding.  Screening Tests Health Maintenance  Topic Date Due   Zoster Vaccines- Shingrix (1 of 2) 07/22/1999   DTaP/Tdap/Td (2 - Tdap) 04/26/2015   Pneumonia Vaccine 57+ Years old (3 of 3 - PPSV23 or PCV20) 10/29/2016   MAMMOGRAM  08/28/2021  COVID-19 Vaccine (3 - 2023-24 season) 10/22/2021   Medicare Annual Wellness (AWV)  08/28/2022   Colonoscopy  04/15/2029 (Originally 09/28/2018)   INFLUENZA VACCINE  09/22/2022   DEXA SCAN  Completed   Hepatitis C Screening  Completed   HPV VACCINES  Aged Out    Health Maintenance  Health Maintenance Due  Topic Date Due   Zoster Vaccines- Shingrix (1 of 2) 07/22/1999   DTaP/Tdap/Td (2 - Tdap) 04/26/2015   Pneumonia Vaccine 34+ Years old (3 of 3 - PPSV23 or PCV20) 10/29/2016   MAMMOGRAM  08/28/2021   COVID-19 Vaccine (3 - 2023-24 season) 10/22/2021   Medicare Annual Wellness (AWV)  08/28/2022    Colorectal Screening: Patient decline  Mammogram Status: Patient's daughter states that patient has imaging performed last year. Unable to see report.   Bone Density status: Completed 01/07/2016. Results reflect: Bone density results: NORMAL. Repeat every 5 years.  Lung Cancer Screening: (Low Dose CT Chest  recommended if Age 85-80 years, 20 pack-year currently smoking OR have quit w/in 15years.) does not qualify.   Additional Screening:  Hepatitis C Screening: does not qualify; Completed 01/13/2016  Vision Screening: Recommended annual ophthalmology exams for early detection of glaucoma and other disorders of the eye. Is the patient up to date with their annual eye exam?  Yes  Who is the provider or what is the name of the office in which the patient attends annual eye exams? States PCP checks her eyes. Wears reading glasses only If pt is not established with a provider, would they like to be referred to a provider to establish care? No .   Dental Screening: Recommended annual dental exams for proper oral hygiene  Diabetic Foot Exam: n/a  Community Resource Referral / Chronic Care Management: CRR required this visit?  No   CCM required this visit?  No     Plan:     I have personally reviewed and noted the following in the patient's chart:   Medical and social history Use of alcohol, tobacco or illicit drugs  Current medications and supplements including opioid prescriptions. Patient is currently taking opioid prescriptions. Information provided to patient regarding non-opioid alternatives. Patient advised to discuss non-opioid treatment plan with their provider. Functional ability and status Nutritional status Physical activity Advanced directives List of other physicians Hospitalizations, surgeries, and ER visits in previous 12 months Vitals Screenings to include cognitive, depression, and falls Referrals and appointments  In addition, I have reviewed and discussed with patient certain preventive protocols, quality metrics, and best practice recommendations. A written personalized care plan for preventive services as well as general preventive health recommendations were provided to patient.   Patient has hydrocodone on med list, however her daughter who helps manage her meds  states she no longer takes that.  Medications reconciled with the help of patients daughter.  Because this visit was a virtual/telehealth visit,  certain criteria was not obtained, such a blood pressure, CBG if patient is a diabetic, and timed up and go.    Jordan Hawks Geraline Halberstadt, CMA   09/01/2022   After Visit Summary: (MyChart) Due to this being a telephonic visit, the after visit summary with patients personalized plan was offered to patient via MyChart   Nurse Notes: Need last mammogram report.

## 2022-09-06 ENCOUNTER — Ambulatory Visit: Payer: Medicare HMO | Admitting: Family Medicine

## 2022-09-06 ENCOUNTER — Encounter: Payer: Self-pay | Admitting: Family Medicine

## 2022-09-06 VITALS — BP 136/70 | HR 69 | Temp 97.5°F | Ht 59.75 in | Wt 131.0 lb

## 2022-09-06 DIAGNOSIS — L989 Disorder of the skin and subcutaneous tissue, unspecified: Secondary | ICD-10-CM | POA: Insufficient documentation

## 2022-09-06 DIAGNOSIS — E2839 Other primary ovarian failure: Secondary | ICD-10-CM | POA: Diagnosis not present

## 2022-09-06 DIAGNOSIS — E7800 Pure hypercholesterolemia, unspecified: Secondary | ICD-10-CM | POA: Diagnosis not present

## 2022-09-06 DIAGNOSIS — Z1231 Encounter for screening mammogram for malignant neoplasm of breast: Secondary | ICD-10-CM | POA: Insufficient documentation

## 2022-09-06 DIAGNOSIS — E039 Hypothyroidism, unspecified: Secondary | ICD-10-CM

## 2022-09-06 DIAGNOSIS — Z0000 Encounter for general adult medical examination without abnormal findings: Secondary | ICD-10-CM

## 2022-09-06 DIAGNOSIS — I700 Atherosclerosis of aorta: Secondary | ICD-10-CM

## 2022-09-06 DIAGNOSIS — R7989 Other specified abnormal findings of blood chemistry: Secondary | ICD-10-CM | POA: Diagnosis not present

## 2022-09-06 DIAGNOSIS — Z860100 Personal history of colon polyps, unspecified: Secondary | ICD-10-CM

## 2022-09-06 DIAGNOSIS — L409 Psoriasis, unspecified: Secondary | ICD-10-CM

## 2022-09-06 DIAGNOSIS — G3184 Mild cognitive impairment, so stated: Secondary | ICD-10-CM

## 2022-09-06 DIAGNOSIS — I10 Essential (primary) hypertension: Secondary | ICD-10-CM | POA: Diagnosis not present

## 2022-09-06 DIAGNOSIS — L304 Erythema intertrigo: Secondary | ICD-10-CM

## 2022-09-06 DIAGNOSIS — Z23 Encounter for immunization: Secondary | ICD-10-CM | POA: Diagnosis not present

## 2022-09-06 DIAGNOSIS — F17200 Nicotine dependence, unspecified, uncomplicated: Secondary | ICD-10-CM

## 2022-09-06 MED ORDER — NYSTATIN 100000 UNIT/GM EX CREA: 1.0000 | TOPICAL_CREAM | Freq: Two times a day (BID) | CUTANEOUS | 3 refills | Status: AC | PRN

## 2022-09-06 NOTE — Assessment & Plan Note (Signed)
Lab for lipids Blood pressure is well controlled No symptoms

## 2022-09-06 NOTE — Assessment & Plan Note (Signed)
Mammogram ordered. Pt will schedule

## 2022-09-06 NOTE — Assessment & Plan Note (Signed)
Continues namenda Per family not much change Works hard to keep a routine and familiar surroundings

## 2022-09-06 NOTE — Patient Instructions (Addendum)
If you are interested in the new shingles vaccine (Shingrix) - call your local pharmacy to check on coverage and availability  If affordable, get on a wait list at your pharmacy to get the vaccine.  If you want to update tetanus shot- also at pharmacy   Try to get 1200-1500 mg of calcium per day with at least 2000 iu of vitamin D - for bone health  Add some strength training to your routine, this is important for bone and brain health and can reduce your risk of falls and help your body use insulin properly and regulate weight  Light weights, exercise bands , and internet videos are a good way to start  Yoga (chair or regular), machines , floor exercises or a gym with machines are also good options   Labs today   Call to schedule your colonoscopy :  [facility]  [phone]  I put the referral in for dermatology for the skin lesion and psoriasis Please let us know if you don't hear in 1-2 weeks      You have an order for:  []   2D Mammogram  [x]   3D Mammogram  [x]   Bone Density     Please call for appointment:   []   [facility]  [address] [place] [address]  [phone]  []   [facility])   [address]  [place], [place] [address]  [phone]  [x]   [facility]      [address] [place], [place]        [phone]         []   [facility]  [address] [employer] [place], [place]  [phone]  []  [facility]   [address]   [place], [place] [address]  [phone]  []  [facility]  [address] [place], [place] [address] [phone]    Make sure to wear two piece clothing  No lotions powders or deodorants the day of the appointment Make sure to bring picture ID and insurance card.  Bring  list of medications you are currently taking including any supplements.   Schedule your screening mammogram through MyChart!   Select [facility] imaging sites can now be scheduled through MyChart.  Log into your MyChart account.  Go to 'Visit' (or 'Appointments' if  on mobile App) --> Schedule an  Appointment  Under 'Select a Reason for Visit' choose the Mammogram  Screening option.  Complete the pre-visit questions  and select the time and place that  best fits your schedule

## 2022-09-06 NOTE — Assessment & Plan Note (Signed)
Smokes 1-2 cig per day now Disc in detail risks of smoking and possible outcomes including copd, vascular/ heart disease, cancer , respiratory and sinus infections  Pt voices understanding  She does not want to quit

## 2022-09-06 NOTE — Assessment & Plan Note (Signed)
B12 level today  High last time  Oral supplementation

## 2022-09-06 NOTE — Assessment & Plan Note (Signed)
bp in fair control at this time  BP Readings from Last 1 Encounters:  09/06/22 136/70   No changes needed Most recent labs reviewed  Disc lifstyle change with low sodium diet and exercise  Continues amlodipine 5 mg daily

## 2022-09-06 NOTE — Assessment & Plan Note (Signed)
Refilled mycostatin for rash under breasts and in axillae prn  Encouraged to keep areas clean and dry

## 2022-09-06 NOTE — Assessment & Plan Note (Signed)
GI referral for follow up colonoscopy (was due in 2020)

## 2022-09-06 NOTE — Assessment & Plan Note (Signed)
TSH ordered  Taking levothyroxine 50 mcg daily  No clinical changes

## 2022-09-06 NOTE — Assessment & Plan Note (Signed)
 Dexa ordered

## 2022-09-06 NOTE — Progress Notes (Signed)
Subjective:    Patient ID: Tammy Neal, female    DOB: [birth year], 72 y.o.   MRN: [id number]  HPI  Here for health maintenance exam and to review chronic medical problems   Wt Readings from Last 3 Encounters:  09/06/22 131 lb (59.4 kg)  09/01/22 135 lb (61.2 kg)  08/27/21 135 lb (61.2 kg)   25.80 kg/m  Vitals:   09/06/22 1429  BP: 136/70  Pulse: 69  Temp: (!) 97.5 F (36.4 C)  SpO2: 95%    Immunization History  Administered Date(s) Administered   Fluad Quad(high Dose 65+) 05/05/2021   Influenza Split 11/02/2011   Influenza Whole 02/21/1997, 11/22/2006, 11/22/2007   Influenza,inj,Quad PF,6+ Mos 11/25/2014, 10/30/2015, 11/04/2016, 01/04/2018   PFIZER(Purple Top)SARS-COV-2 Vaccination 04/21/2019, 05/14/2019   PNEUMOCOCCAL CONJUGATE-20 09/06/2022   Pneumococcal Conjugate-13 10/30/2015   Pneumococcal Polysaccharide-23 10/30/2002, 05/03/2010   Td 04/25/2005   Zoster, Live 07/06/2010    Health Maintenance Due  Topic Date Due   DTaP/Tdap/Td (2 - Tdap) 04/26/2015   MAMMOGRAM  08/28/2021    Feels ok  No new problems    Shingrix vaccine : not interested   Tetanus vaccine -interested    Pna vaccine -interested in prevnar    Mammogram-positive she had one in 2023 but it did not show up on epic   Self breast exam: no lumps   Gyn care/ pap : no problems    Colon cancer screening - declines colonoscopy    Dexa 2017 -bmd in normal range  Falls: none  Fractures:none  Supplements -none right now  Exercise - active and working   Smoking status : smokes a little  2 per day average  Does not have cough or shortness of breath  (no more than her usual smoker's cough)  Not interested in quitting    Mood    09/01/2022    2:25 PM 08/27/2021    2:36 PM 12/29/2020    3:15 PM 04/16/2019   10:59 AM 04/16/2019   10:45 AM  Depression screen PHQ 2/9  Decreased Interest 0 0 0 0 0  Down, Depressed, Hopeless 0 0 0 0 0  PHQ - 2 Score 0 0 0 0 0  Altered sleeping     0 0  Tired, decreased energy    0 0  Change in appetite    0 0  Feeling bad or failure about yourself     0 0  Trouble concentrating    0 0  Moving slowly or fidgety/restless    0 0  Suicidal thoughts    0 0  PHQ-9 Score    0 0  Difficult doing work/chores    Not difficult at all Not difficult at all    History of MCI Takes namenda 10 mg bid  This may be slightly worse but nothing severe  She is able to stay by herself for periods  Family helps with medication  She stays on a schedule  Family leaves notes She goes back and forth with several family members Stays content  Stays busy    HTN bp is stable today  No cp or palpitations or headaches or edema  No side effects to medicines  BP Readings from Last 3 Encounters:  09/06/22 136/70  08/13/21 137/64  07/30/21 (!) 147/62     Amlodipine 5 mg daily   Hypothyroidism  Pt has no clinical changes No change in energy level/ hair or skin/ edema and no tremor Lab Results  Component Value Date  TSH 1.21 09/28/2021    Due for labs  Levothyroxine 50 mcg daily   Hyperlipidemia  Lab Results  Component Value Date   CHOL 163 05/05/2021   HDL 66.10 05/05/2021   LDLCALC 77 05/05/2021   LDLDIRECT 135.0 04/29/2020   TRIG 100.0 05/05/2021   CHOLHDL 2 05/05/2021   Due for labs  Atorvastatin was changed to crestor 10 mg twice weekly   History of low B12 Lab Results  Component Value Date   VITAMINB12 >1500 (H) 09/28/2021   This increase with oral supplement   Has spot on her right cheek Needs another dermatology referral     Patient Active Problem List   Diagnosis Date Noted   History of colon polyps 09/06/2022   Skin lesion 09/06/2022   Encounter for screening mammogram for breast cancer 09/06/2022   Osteoarthritis of right hip 08/11/2021   Pill dysphagia 07/05/2021   Epidermal cyst of face 04/13/2021   Right hip pain 12/29/2020   Hypothyroid 06/23/2020   Essential hypertension 04/29/2020   Spondylosis  without myelopathy or radiculopathy, cervical region 02/28/2020   Low vitamin B12 level 03/20/2018   Anxiety disorder 03/20/2018   OA (osteoarthritis) of hip 11/16/2016   Aortic atherosclerosis (HCC) 11/06/2016   Pre-op examination 11/04/2016   Arthritis of left hip 07/13/2016   Estrogen deficiency 12/29/2015   Cough 04/30/2015   Mild cognitive impairment 01/02/2015   Intertrigo 11/02/2011   Routine general medical examination at a health care facility 10/26/2011   Gynecological examination 07/05/2010   DEPRESSION 05/03/2010   Neck pain 06/10/2008   Hyperlipidemia 02/26/2007   Smoker 02/26/2007   Psoriasis 02/26/2007   Past Medical History:  Diagnosis Date   Allergy    allergic rhinitis   Anxiety    Arthritis    COPD (chronic obstructive pulmonary disease) (HCC)    GERD (gastroesophageal reflux disease)    Hyperlipidemia    no meds   Hypertension    Hypothyroidism    Neck pain, chronic    with fusion   Osteoporosis    Psoriasis    Past Surgical History:  Procedure Laterality Date   ABDOMINAL HYSTERECTOMY     endometriosis 1 ovary left   APPENDECTOMY     NECK SURGERY     SPINE SURGERY  1977   cervical fusion Dr Channing Mutters x2   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   Procedure: LEFT TOTAL HIP ARTHROPLASTY ANTERIOR APPROACH;  Surgeon: Ollen Gross, MD;  Location: [facility];  Service: Orthopedics;  Laterality: Left;   TOTAL HIP ARTHROPLASTY Right 08/11/2021   Procedure: TOTAL HIP ARTHROPLASTY ANTERIOR APPROACH;  Surgeon: Ollen Gross, MD;  Location: [facility];  Service: Orthopedics;  Laterality: Right;   Social History   Tobacco Use   Smoking status: Some Days    Types: Cigarettes   Smokeless tobacco: Never  Vaping Use   Vaping status: Never Used  Substance Use Topics   Alcohol use: No   Drug use: No   Family History  Problem Relation Age of Onset   Hypertension Mother    Diabetes Mother    Stroke Mother 66   Asthma Father    COPD Father    Colon cancer Neg Hx     Esophageal cancer Neg Hx    Ulcerative colitis Neg Hx    Stomach cancer Neg Hx    Allergies  Allergen Reactions   Voltaren [Diclofenac Sodium] Anaphylaxis, Hives and Itching   Aricept [Donepezil Hcl] Itching    Itchy, jittery (no hives)  Aspirin Other (See Comments)    REACTION: GI upset   Lipitor [Atorvastatin]     REACTION: pain   Zocor [Simvastatin]     REACTION: muscle pain   Current Outpatient Medications on File Prior to Visit  Medication Sig Dispense Refill   acetaminophen (TYLENOL) 160 MG/5ML liquid Take 480 mg by mouth every 4 (four) hours as needed for pain.     amLODipine (NORVASC) 5 MG tablet TAKE 1 TABLET BY MOUTH ONCE A DAY 90 tablet 3   benzonatate (TESSALON) 200 MG capsule Take 1 capsule (200 mg total) by mouth 3 (three) times daily as needed. 30 capsule 3   busPIRone (BUSPAR) 15 MG tablet TAKE ONE QUARTER (1/4) TABLET BY MOUTH TWICE A DAY 45 tablet 0   Cyanocobalamin (VITAMIN B-12 PO) Take 3,000 mcg by mouth in the morning. Gummies     famotidine (PEPCID) 20 MG tablet Take 1 tablet (20 mg total) by mouth daily. 90 tablet 0   levothyroxine (SYNTHROID) 50 MCG tablet TAKE ONE TABLET BY MOUTH ONCE A DAY BEFORE BREAKFAST. 90 tablet 0   memantine (NAMENDA) 10 MG tablet TAKE ONE TABLET BY MOUTH TWICE A DAY 180 tablet 0   rosuvastatin (CRESTOR) 10 MG tablet TAKE 1 TABLET BY MOUTH TWICE WEEKLY 24 tablet 0   No current facility-administered medications on file prior to visit.    Review of Systems  Constitutional:  Negative for activity change, appetite change, fatigue, fever and unexpected weight change.  HENT:  Negative for congestion, ear pain, rhinorrhea, sinus pressure and sore throat.   Eyes:  Negative for pain, redness and visual disturbance.  Respiratory:  Negative for cough, shortness of breath and wheezing.   Cardiovascular:  Negative for chest pain and palpitations.  Gastrointestinal:  Negative for abdominal pain, blood in stool, constipation and diarrhea.   Endocrine: Negative for polydipsia and polyuria.  Genitourinary:  Negative for dysuria, frequency and urgency.  Musculoskeletal:  Negative for arthralgias, back pain and myalgias.  Skin:  Negative for pallor and rash.  Allergic/Immunologic: Negative for environmental allergies.  Neurological:  Negative for dizziness, syncope and headaches.  Hematological:  Negative for adenopathy. Does not bruise/bleed easily.  Psychiatric/Behavioral:  Positive for decreased concentration. Negative for confusion and dysphoric mood. The patient is not nervous/anxious.        Pt has short term memory issues/ MCI       Objective:   Physical Exam Constitutional:      General: She is not in acute distress.    Appearance: Normal appearance. She is well-developed and normal weight. She is not ill-appearing or diaphoretic.  HENT:     Head: Normocephalic and atraumatic.     Right Ear: Tympanic membrane, ear canal and external ear normal.     Left Ear: Tympanic membrane, ear canal and external ear normal.     Nose: Nose normal. No congestion.     Mouth/Throat:     Mouth: Mucous membranes are moist.     Pharynx: Oropharynx is clear. No posterior oropharyngeal erythema.  Eyes:     General: No scleral icterus.    Extraocular Movements: Extraocular movements intact.     Conjunctiva/sclera: Conjunctivae normal.     Pupils: Pupils are equal, round, and reactive to light.  Neck:     Thyroid: No thyromegaly.     Vascular: No carotid bruit or JVD.  Cardiovascular:     Rate and Rhythm: Normal rate and regular rhythm.     Pulses: Normal pulses.  Heart sounds: Normal heart sounds.     No gallop.  Pulmonary:     Effort: Pulmonary effort is normal. No respiratory distress.     Breath sounds: Normal breath sounds. No wheezing.     Comments: Good air exch Chest:     Chest wall: No tenderness.  Abdominal:     General: Bowel sounds are normal. There is no distension or abdominal bruit.     Palpations: Abdomen  is soft. There is no mass.     Tenderness: There is no abdominal tenderness.     Hernia: No hernia is present.  Genitourinary:    Comments: Breast exam: No mass, nodules, thickening, tenderness, bulging, retraction, inflamation, nipple discharge or skin changes noted.  No axillary or clavicular LA.     Musculoskeletal:        General: No tenderness. Normal range of motion.     Cervical back: Normal range of motion and neck supple. No rigidity. No muscular tenderness.     Right lower leg: No edema.     Left lower leg: No edema.     Comments: No kyphosis   Lymphadenopathy:     Cervical: No cervical adenopathy.  Skin:    General: Skin is warm and dry.     Coloration: Skin is not pale.     Findings: No erythema or rash.     Comments: Solar lentigines diffusely  Keratotic lesion on right cheek  Psoriasis plaques-buttocks and knee areas   Neurological:     Mental Status: She is alert. Mental status is at baseline.     Cranial Nerves: No cranial nerve deficit.     Motor: No abnormal muscle tone.     Coordination: Coordination normal.     Gait: Gait normal.     Deep Tendon Reflexes: Reflexes are normal and symmetric. Reflexes normal.  Psychiatric:        Attention and Perception: Attention normal.        Mood and Affect: Mood normal.        Cognition and Memory: Cognition is impaired. She exhibits impaired recent memory.     Comments: Pleasant Content            Assessment & Plan:   Problem List Items Addressed This Visit       Cardiovascular and Mediastinum   Essential hypertension    bp in fair control at this time  BP Readings from Last 1 Encounters:  09/06/22 136/70   No changes needed Most recent labs reviewed  Disc lifstyle change with low sodium diet and exercise  Continues amlodipine 5 mg daily      Relevant Orders   TSH   Lipid panel   Comprehensive metabolic panel   CBC with Differential/Platelet   Aortic atherosclerosis (HCC)    Lab for  lipids Blood pressure is well controlled No symptoms        Endocrine   Hypothyroid    TSH ordered  Taking levothyroxine 50 mcg daily  No clinical changes       Relevant Orders   TSH     Musculoskeletal and Integument   Skin lesion    On right cheek Keratotic Pt picks at it   Ref done to dermatology      Relevant Orders   Ambulatory referral to Dermatology   Psoriasis    Patches on legs and buttocks Referral done to dermatology  Interested in topical treatment       Relevant Orders   Ambulatory  referral to Dermatology   Intertrigo    Refilled mycostatin for rash under breasts and in axillae prn  Encouraged to keep areas clean and dry        Other   Smoker    Smokes 1-2 cig per day now Disc in detail risks of smoking and possible outcomes including copd, vascular/ heart disease, cancer , respiratory and sinus infections  Pt voices understanding  She does not want to quit       Routine general medical examination at a health care facility - Primary    Reviewed health habits including diet and exercise and skin cancer prevention Reviewed appropriate screening tests for age  Also reviewed health mt list, fam hx and immunization status , as well as social and family history   See HPI Labs reviewed and ordered Declines shingrix vaccine  Interested in tetanus shot at pharmacy  Prevnar 20 vaccine given today  Mammogram and dexa ordered  Open to screening colonoscopy now (5 year recall from 2015)- GI referral done and pt will call to schedule  Counseled re: smoking cessation (smokes 1-2 cig per day and not interested in quitting) PHQ: 0      Mild cognitive impairment    Continues namenda Per family not much change Works hard to keep a routine and familiar surroundings       Low vitamin B12 level    B12 level today  High last time  Oral supplementation       Relevant Orders   Vitamin B12   Hyperlipidemia    Disc goals for lipids and reasons to  control them Rev last labs with pt Rev low sat fat diet in detail Labs ordered today  Taking crestor 10 mg twice weekly       Relevant Orders   Lipid panel   History of colon polyps    GI referral for follow up colonoscopy (was due in 2020)      Relevant Orders   Ambulatory referral to Gastroenterology   Estrogen deficiency    Dexa ordered      Relevant Orders   DG Bone Density   Encounter for screening mammogram for breast cancer    Mammogram ordered Pt will schedule      Relevant Orders   MM 3D SCREENING MAMMOGRAM BILATERAL BREAST   Other Visit Diagnoses     Need for pneumococcal 20-valent conjugate vaccination       Relevant Orders   Pneumococcal conjugate vaccine 20-valent (Prevnar 20) (Completed)

## 2022-09-06 NOTE — Assessment & Plan Note (Signed)
Disc goals for lipids and reasons to control them Rev last labs with pt Rev low sat fat diet in detail Labs ordered today  Taking crestor 10 mg twice weekly

## 2022-09-06 NOTE — Assessment & Plan Note (Signed)
On right cheek Keratotic Pt picks at it   Ref done to dermatology

## 2022-09-06 NOTE — Assessment & Plan Note (Signed)
Patches on legs and buttocks Referral done to dermatology  Interested in topical treatment

## 2022-09-06 NOTE — Assessment & Plan Note (Signed)
Reviewed health habits including diet and exercise and skin cancer prevention Reviewed appropriate screening tests for age  Also reviewed health mt list, fam hx and immunization status , as well as social and family history   See HPI Labs reviewed and ordered Declines shingrix vaccine  Interested in tetanus shot at pharmacy  Prevnar 20 vaccine given today  Mammogram and dexa ordered  Open to screening colonoscopy now (5 year recall from 2015)- GI referral done and pt will call to schedule  Counseled re: smoking cessation (smokes 1-2 cig per day and not interested in quitting) PHQ: 0

## 2022-09-07 LAB — CBC WITH DIFFERENTIAL/PLATELET
Basophils Absolute: 0.1 10*3/uL (ref 0.0–0.1)
Basophils Relative: 0.9 % (ref 0.0–3.0)
Eosinophils Absolute: 0.2 10*3/uL (ref 0.0–0.7)
Eosinophils Relative: 3.1 % (ref 0.0–5.0)
HCT: 43.6 % (ref 36.0–46.0)
Hemoglobin: 14.3 g/dL (ref 12.0–15.0)
Lymphocytes Relative: 27.9 % (ref 12.0–46.0)
Lymphs Abs: 2.2 10*3/uL (ref 0.7–4.0)
MCHC: 32.9 g/dL (ref 30.0–36.0)
MCV: 97.5 fl (ref 78.0–100.0)
Monocytes Absolute: 1 10*3/uL (ref 0.1–1.0)
Monocytes Relative: 13 % — ABNORMAL HIGH (ref 3.0–12.0)
Neutro Abs: 4.3 10*3/uL (ref 1.4–7.7)
Neutrophils Relative %: 55.1 % (ref 43.0–77.0)
Platelets: 235 10*3/uL (ref 150.0–400.0)
RBC: 4.47 Mil/uL (ref 3.87–5.11)
RDW: 13.3 % (ref 11.5–15.5)
WBC: 7.7 10*3/uL (ref 4.0–10.5)

## 2022-09-07 LAB — COMPREHENSIVE METABOLIC PANEL
ALT: 7 U/L (ref 0–35)
AST: 12 U/L (ref 0–37)
Albumin: 4.1 g/dL (ref 3.5–5.2)
Alkaline Phosphatase: 75 U/L (ref 39–117)
BUN: 12 mg/dL (ref 6–23)
CO2: 30 mEq/L (ref 19–32)
Calcium: 9.4 mg/dL (ref 8.4–10.5)
Chloride: 104 mEq/L (ref 96–112)
Creatinine, Ser: 0.78 mg/dL (ref 0.40–1.20)
GFR: 75.5 mL/min (ref >60.00)
Glucose, Bld: 88 mg/dL (ref 70–99)
Potassium: 4.3 mEq/L (ref 3.5–5.1)
Sodium: 143 mEq/L (ref 135–145)
Total Bilirubin: 0.5 mg/dL (ref 0.2–1.2)
Total Protein: 6.4 g/dL (ref 6.0–8.3)

## 2022-09-07 LAB — LIPID PANEL
Cholesterol: 192 mg/dL (ref 0–200)
HDL: 43.7 mg/dL (ref >39.00)
NonHDL: 148.14
Total CHOL/HDL Ratio: 4
Triglycerides: 313 mg/dL — ABNORMAL HIGH (ref 0.0–149.0)
VLDL: 62.6 mg/dL — ABNORMAL HIGH (ref 0.0–40.0)

## 2022-09-07 LAB — TSH: TSH: 2.79 u[IU]/mL (ref 0.35–5.50)

## 2022-09-07 LAB — LDL CHOLESTEROL, DIRECT: Direct LDL: 88 mg/dL

## 2022-09-07 LAB — VITAMIN B12: Vitamin B-12: 398 pg/mL (ref 211–911)

## 2022-09-09 ENCOUNTER — Telehealth: Payer: Self-pay | Admitting: *Deleted

## 2022-09-09 MED ORDER — ROSUVASTATIN CALCIUM 10 MG PO TABS: ORAL_TABLET | ORAL | 2 refills | Status: DC

## 2022-09-09 MED ORDER — MEMANTINE HCL 10 MG PO TABS: 10.0000 mg | ORAL_TABLET | Freq: Two times a day (BID) | ORAL | 2 refills | Status: DC

## 2022-09-09 MED ORDER — LEVOTHYROXINE SODIUM 50 MCG PO TABS: 50.0000 ug | ORAL_TABLET | Freq: Every day | ORAL | 2 refills | Status: DC

## 2022-09-09 MED ORDER — AMLODIPINE BESYLATE 5 MG PO TABS: 5.0000 mg | ORAL_TABLET | Freq: Every day | ORAL | 2 refills | Status: DC

## 2022-09-09 NOTE — Telephone Encounter (Signed)
-----   Message from [facility] sent at 09/07/2022  7:31 PM EDT ----- Cholesterol is up / especially triglycerides Try and eat less fat and sugar for this  Re check lipid profile in 3 months  Continue the generic crestor  Tsh in normal range-please refill levothyroxine for a year  B12 is ok  Other labs ok/stable

## 2022-09-29 ENCOUNTER — Other Ambulatory Visit: Payer: Self-pay | Admitting: Family Medicine

## 2022-10-10 ENCOUNTER — Encounter: Payer: Self-pay | Admitting: Family Medicine

## 2022-12-11 ENCOUNTER — Telehealth: Payer: Self-pay | Admitting: Family Medicine

## 2022-12-11 DIAGNOSIS — E7800 Pure hypercholesterolemia, unspecified: Secondary | ICD-10-CM

## 2022-12-11 NOTE — Telephone Encounter (Signed)
-----   Message from Alvina Chou sent at 11/28/2022  9:41 AM EDT ----- Regarding: Lab orders for Mon, 10.21.24 Lab orders, thanks

## 2022-12-12 ENCOUNTER — Other Ambulatory Visit: Payer: Medicare HMO

## 2022-12-13 ENCOUNTER — Other Ambulatory Visit (INDEPENDENT_AMBULATORY_CARE_PROVIDER_SITE_OTHER): Payer: Medicare HMO

## 2022-12-13 DIAGNOSIS — E7800 Pure hypercholesterolemia, unspecified: Secondary | ICD-10-CM

## 2022-12-13 LAB — LIPID PANEL
Cholesterol: 245 mg/dL — ABNORMAL HIGH (ref 0–200)
HDL: 46.5 mg/dL (ref >39.00)
LDL Cholesterol: 150 mg/dL — ABNORMAL HIGH (ref 0–99)
NonHDL: 198.44
Total CHOL/HDL Ratio: 5
Triglycerides: 243 mg/dL — ABNORMAL HIGH (ref 0.0–149.0)
VLDL: 48.6 mg/dL — ABNORMAL HIGH (ref 0.0–40.0)

## 2022-12-19 ENCOUNTER — Other Ambulatory Visit: Payer: Self-pay

## 2022-12-19 MED ORDER — ROSUVASTATIN CALCIUM 10 MG PO TABS: ORAL_TABLET | ORAL | 1 refills | Status: DC

## 2023-01-08 ENCOUNTER — Telehealth: Payer: Self-pay | Admitting: Family Medicine

## 2023-01-08 DIAGNOSIS — E7800 Pure hypercholesterolemia, unspecified: Secondary | ICD-10-CM

## 2023-01-08 NOTE — Telephone Encounter (Signed)
-----   Message from Lovena Neighbours sent at 12/26/2022  1:51 PM EST ----- Regarding: Labs for Monday 11.18.24 Please put fasting lab orders in future. Thank you, Denny Peon

## 2023-01-09 ENCOUNTER — Other Ambulatory Visit (INDEPENDENT_AMBULATORY_CARE_PROVIDER_SITE_OTHER): Payer: Medicare HMO

## 2023-01-09 DIAGNOSIS — E7800 Pure hypercholesterolemia, unspecified: Secondary | ICD-10-CM | POA: Diagnosis not present

## 2023-01-09 LAB — AST: AST: 14 U/L (ref 0–37)

## 2023-01-09 LAB — LIPID PANEL
Cholesterol: 161 mg/dL (ref 0–200)
HDL: 47.3 mg/dL (ref >39.00)
LDL Cholesterol: 71 mg/dL (ref 0–99)
NonHDL: 114.15
Total CHOL/HDL Ratio: 3
Triglycerides: 215 mg/dL — ABNORMAL HIGH (ref 0.0–149.0)
VLDL: 43 mg/dL — ABNORMAL HIGH (ref 0.0–40.0)

## 2023-01-09 LAB — ALT: ALT: 10 U/L (ref 0–35)

## 2023-01-10 ENCOUNTER — Other Ambulatory Visit: Payer: Self-pay | Admitting: *Deleted

## 2023-01-10 MED ORDER — ROSUVASTATIN CALCIUM 10 MG PO TABS: ORAL_TABLET | ORAL | 3 refills | Status: DC

## 2023-01-13 IMAGING — DX DG HIP (WITH OR WITHOUT PELVIS) 2-3V*R*
3 series · 3 of 3 positions shown · non-contrast
Comparison: X-ray pelvis 12/15/2014

CLINICAL DATA: pain in r groin past history of OA of hip

EXAM:
DG HIP (WITH OR WITHOUT PELVIS) 2-3V RIGHT

[pelvis ap]
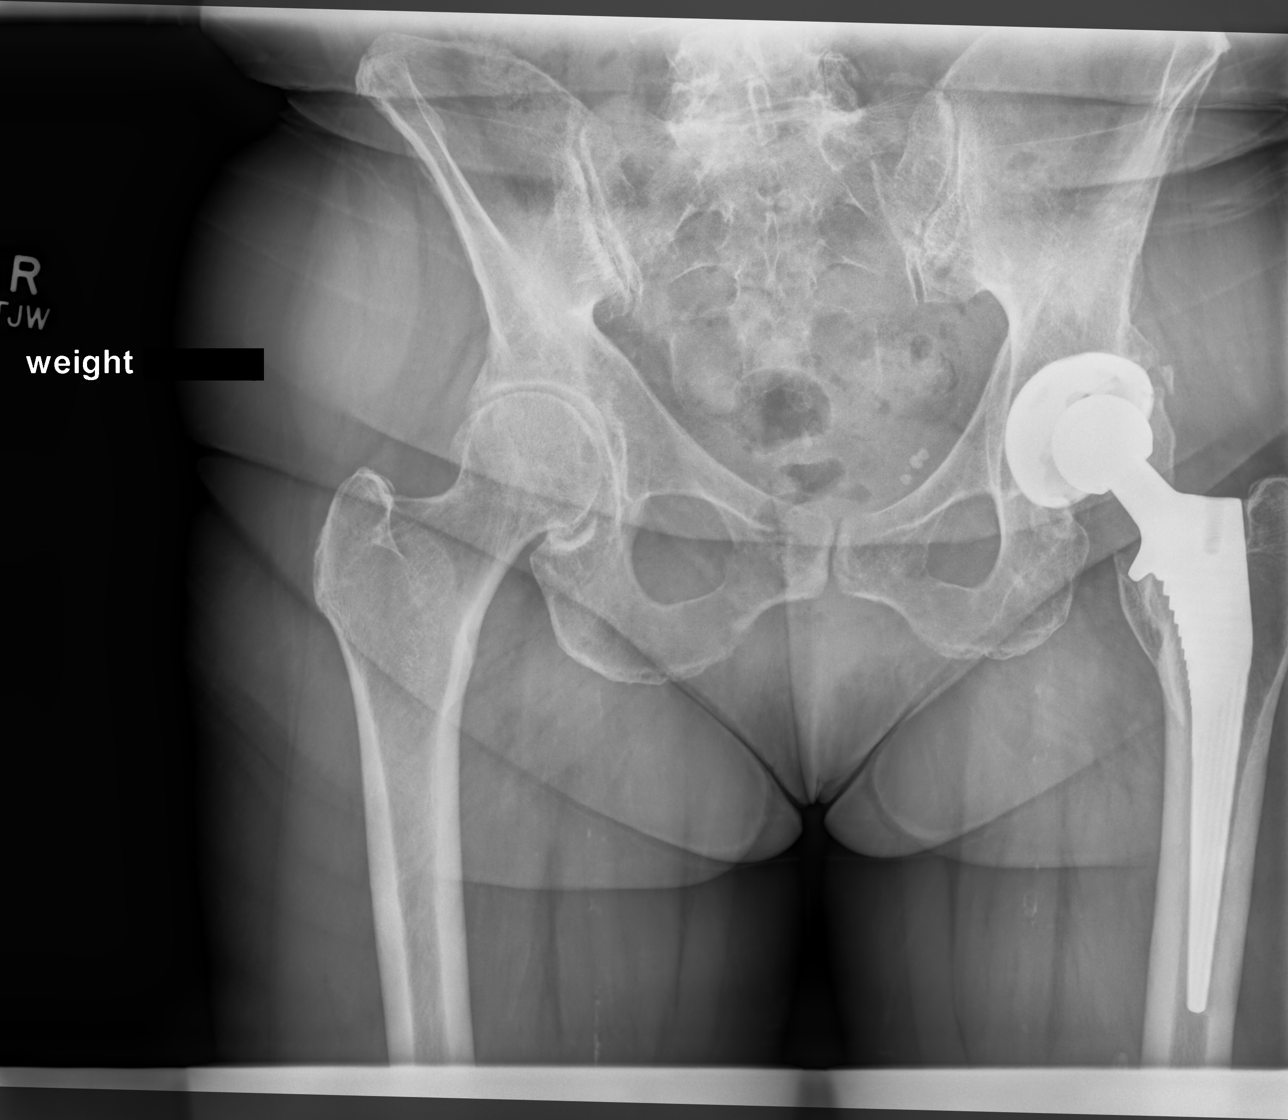

[hip joint ap]
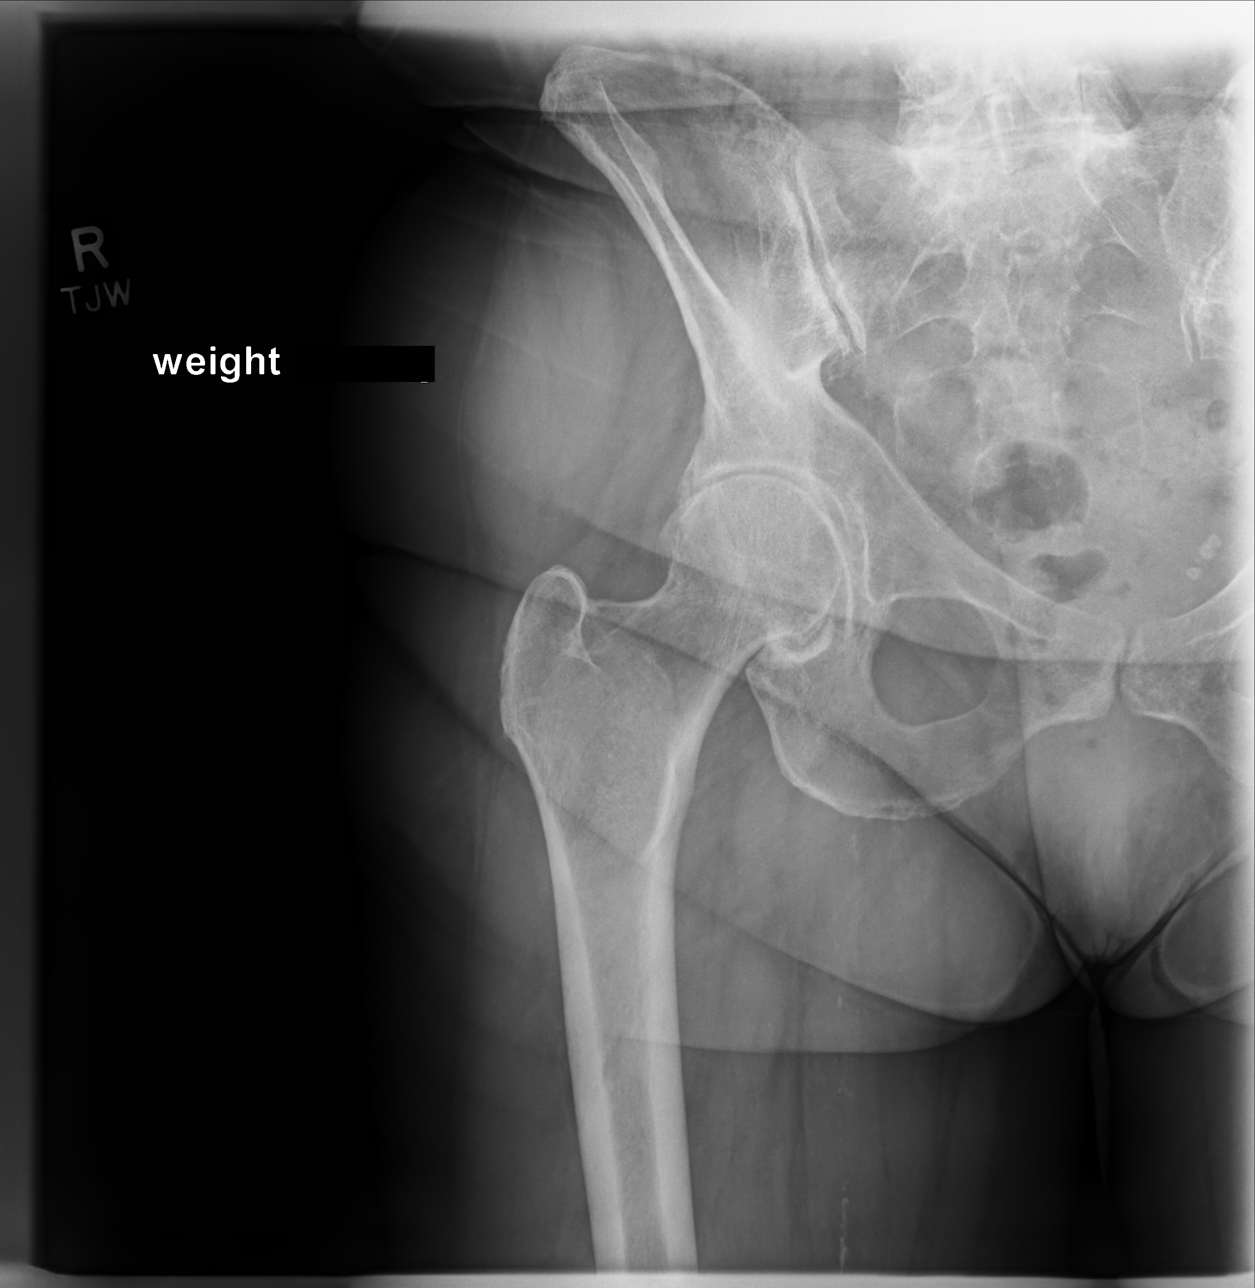

[hip frog leg lat]
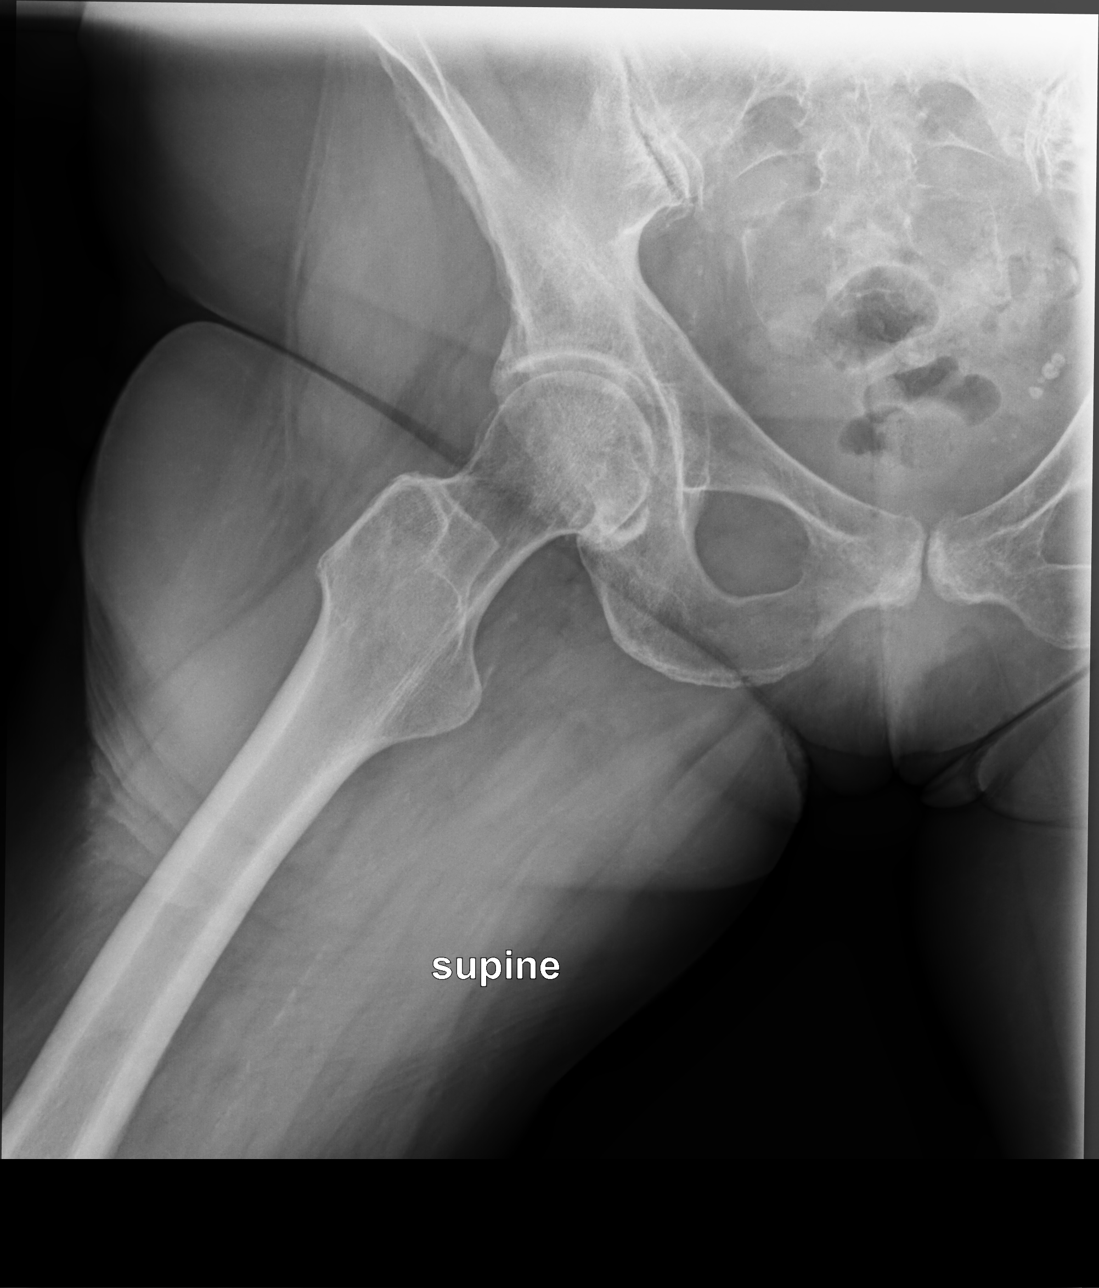

[3 of 3 positions shown; findings below may reference images not displayed]

FINDINGS: Total left hip arthroplasty. At least mild degenerative changes of
the right hip. There is no evidence of hip fracture or dislocation.
No pelvic diastasis. There is no evidence of severe arthropathy or
other focal bone abnormality.
IMPRESSION: 1. At least mild degenerative changes of the right hip.
2. Status post total left hip arthroplasty.

## 2023-01-25 ENCOUNTER — Ambulatory Visit: Payer: Medicare HMO

## 2023-02-20 ENCOUNTER — Telehealth: Payer: Self-pay

## 2023-02-20 MED ORDER — BENZONATATE 200 MG PO CAPS
200.0000 mg | ORAL_CAPSULE | Freq: Three times a day (TID) | ORAL | 1 refills | Status: DC | PRN
Start: 2023-02-20 — End: 2023-08-17

## 2023-02-20 NOTE — Telephone Encounter (Signed)
Copied from CRM [id number]. Topic: Clinical - Medical Advice >> Feb 20, 2023  8:44 AM Gaetano Hawthorne wrote: Reason for CRM: Patient has an acute visit for tomorrow, 12/31 scheduled with Dr. Milinda Antis - Daughter was wondering if they could have some tessalon pearls to help with the cough until they're able to be seen.  Contact Center agent did not have any appointments for today to offer the patient.

## 2023-02-20 NOTE — Addendum Note (Signed)
Addended by: Roxy Manns A on: 02/20/2023 03:54 PM   Modules accepted: Orders

## 2023-02-20 NOTE — Telephone Encounter (Signed)
I sent it to [facility] pharmacy   Thanks

## 2023-02-21 ENCOUNTER — Ambulatory Visit (INDEPENDENT_AMBULATORY_CARE_PROVIDER_SITE_OTHER): Payer: Medicare HMO | Admitting: Family Medicine

## 2023-02-21 ENCOUNTER — Ambulatory Visit (INDEPENDENT_AMBULATORY_CARE_PROVIDER_SITE_OTHER)
Admission: RE | Admit: 2023-02-21 | Discharge: 2023-02-21 | Disposition: A | Discharge status: Home / Self Care | Payer: Medicare HMO | Source: Ambulatory Visit | Attending: Family Medicine | Admitting: Family Medicine

## 2023-02-21 ENCOUNTER — Encounter: Payer: Self-pay | Admitting: Family Medicine

## 2023-02-21 VITALS — BP 122/64 | HR 74 | Temp 98.7°F | Ht 59.75 in | Wt 132.4 lb

## 2023-02-21 DIAGNOSIS — R059 Cough, unspecified: Secondary | ICD-10-CM | POA: Diagnosis not present

## 2023-02-21 DIAGNOSIS — F17200 Nicotine dependence, unspecified, uncomplicated: Secondary | ICD-10-CM

## 2023-02-21 DIAGNOSIS — J209 Acute bronchitis, unspecified: Secondary | ICD-10-CM | POA: Diagnosis not present

## 2023-02-21 DIAGNOSIS — R062 Wheezing: Secondary | ICD-10-CM | POA: Diagnosis not present

## 2023-02-21 DIAGNOSIS — I700 Atherosclerosis of aorta: Secondary | ICD-10-CM | POA: Diagnosis not present

## 2023-02-21 DIAGNOSIS — J440 Chronic obstructive pulmonary disease with acute lower respiratory infection: Secondary | ICD-10-CM | POA: Diagnosis not present

## 2023-02-21 MED ORDER — PREDNISONE 10 MG PO TABS: ORAL_TABLET | ORAL | 0 refills | Status: DC

## 2023-02-21 MED ORDER — PROMETHAZINE-DM 6.25-15 MG/5ML PO SYRP: 5.0000 mL | ORAL_SOLUTION | Freq: Three times a day (TID) | ORAL | 0 refills | Status: DC | PRN

## 2023-02-21 NOTE — Assessment & Plan Note (Addendum)
 In a light smoker (smoked heavier in past)  Prod cough /some wheezing  Reassuring exam  Cxr : no infiltrate/ reassuring  Declines covid testing Prescription prednisone  40 mg taper (aware of side effects) Tessalon  for cough  Prometh  dm for cough- caution of sedation  Pt has albuterol  neb at home for prn use   Update if not starting to improve in a week or if worsening  Call back and Er precautions noted in detail today

## 2023-02-21 NOTE — Patient Instructions (Addendum)
 This is a good time to quit smoking for good   Drink fluids Rest  Take prednisone  as directed for wheezing/tight chest  Tessalon  for cough  Also try the prometh  dm for cough at night (caution of sedation)   Chest xray today  We will contact you with result

## 2023-02-21 NOTE — Progress Notes (Signed)
 Subjective:    Patient ID: Tammy Neal, female    DOB: 1950-10-28, 72 y.o.   MRN: [id number]  HPI  Wt Readings from Last 3 Encounters:  02/21/23 132 lb 6 oz (60 kg)  09/06/22 131 lb (59.4 kg)  09/01/22 135 lb (61.2 kg)   26.07 kg/m  Vitals:   02/21/23 1015  BP: 122/64  Pulse: 74  Temp: 98.7 F (37.1 C)  SpO2: 97%    Pt presents with c/o cough and chest congestion for 5 days   Cough - junky sounding  Phlegm - clear to green  Perhaps a little wheezing / or possibly a crackle Not shortness of breath  No fever  Worse at night- cannot sleep due to cough   Some nasal congestion when she coughs  No headache  No ST Not hoarse  Very tired     Has albuterol  nebs  Tessalon  pearles  Day quil  Tylenol   Albuterol  neb    Smoking status : 2 cig per day usually / less than that due to cough    Sent in tessalon  to pharmacy yesterday   Imaging  DG Chest 2 View Result Date: 02/21/2023 CLINICAL DATA:  Smoker with cough and wheezing for 5 days EXAM: CHEST - 2 VIEW COMPARISON:  05/05/2021 FINDINGS: Cervical spine fixation. Lateral view degraded by patient arm position. Midline trachea. Normal heart size. Atherosclerosis in the transverse aorta. No pleural effusion or pneumothorax. Mild S shaped thoracolumbar spine curvature. IMPRESSION: No acute process or explanation for cough/wheezing. Aortic Atherosclerosis (ICD10-I70.0). Electronically Signed   By: Rockey Kilts M.D.   On: 02/21/2023 12:27       Patient Active Problem List   Diagnosis Date Noted   Bronchitis, acute 02/21/2023   History of colon polyps 09/06/2022   Skin lesion 09/06/2022   Encounter for screening mammogram for breast cancer 09/06/2022   Osteoarthritis of right hip 08/11/2021   Pill dysphagia 07/05/2021   Epidermal cyst of face 04/13/2021   Right hip pain 12/29/2020   Hypothyroid 06/23/2020   Essential hypertension 04/29/2020   Spondylosis without myelopathy or radiculopathy, cervical region  02/28/2020   Low vitamin B12 level 03/20/2018   Anxiety disorder 03/20/2018   OA (osteoarthritis) of hip 11/16/2016   Aortic atherosclerosis (HCC) 11/06/2016   Pre-op examination 11/04/2016   Arthritis of left hip 07/13/2016   Estrogen deficiency 12/29/2015   Cough 04/30/2015   Mild cognitive impairment 01/02/2015   Intertrigo 11/02/2011   Routine general medical examination at a health care facility 10/26/2011   Encounter for gynecological examination 07/05/2010   DEPRESSION 05/03/2010   Neck pain 06/10/2008   Hyperlipidemia 02/26/2007   Smoker 02/26/2007   Psoriasis 02/26/2007   Past Medical History:  Diagnosis Date   Allergy    allergic rhinitis   Anxiety    Arthritis    COPD (chronic obstructive pulmonary disease) (HCC)    GERD (gastroesophageal reflux disease)    Hyperlipidemia    no meds   Hypertension    Hypothyroidism    Neck pain, chronic    with fusion   Osteoporosis    Psoriasis    Past Surgical History:  Procedure Laterality Date   ABDOMINAL HYSTERECTOMY     endometriosis 1 ovary left   APPENDECTOMY     NECK SURGERY     SPINE SURGERY  1977   cervical fusion Dr Gaither x2   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   Procedure: LEFT TOTAL HIP ARTHROPLASTY ANTERIOR APPROACH;  Surgeon: Melodi,  Dempsey, MD;  Location: [facility];  Service: Orthopedics;  Laterality: Left;   TOTAL HIP ARTHROPLASTY Right 08/11/2021   Procedure: TOTAL HIP ARTHROPLASTY ANTERIOR APPROACH;  Surgeon: Melodi Dempsey, MD;  Location: [facility];  Service: Orthopedics;  Laterality: Right;   Social History   Tobacco Use   Smoking status: Some Days    Types: Cigarettes   Smokeless tobacco: Never  Vaping Use   Vaping status: Never Used  Substance Use Topics   Alcohol use: No   Drug use: No   Family History  Problem Relation Age of Onset   Hypertension Mother    Diabetes Mother    Stroke Mother 36   Asthma Father    COPD Father    Colon cancer Neg Hx    Esophageal cancer Neg Hx     Ulcerative colitis Neg Hx    Stomach cancer Neg Hx    Allergies  Allergen Reactions   Voltaren  [Diclofenac  Sodium] Anaphylaxis, Hives and Itching   Aricept  [Donepezil  Hcl] Itching    Itchy, jittery (no hives)   Aspirin Other (See Comments)    REACTION: GI upset   Lipitor [Atorvastatin]     REACTION: pain   Zocor [Simvastatin]     REACTION: muscle pain   Current Outpatient Medications on File Prior to Visit  Medication Sig Dispense Refill   acetaminophen  (TYLENOL ) 160 MG/5ML liquid Take 480 mg by mouth every 4 (four) hours as needed for pain.     amLODipine  (NORVASC ) 5 MG tablet Take 1 tablet (5 mg total) by mouth daily. 90 tablet 2   benzonatate  (TESSALON ) 200 MG capsule Take 1 capsule (200 mg total) by mouth 3 (three) times daily as needed. Swallow whole 30 capsule 1   busPIRone  (BUSPAR ) 15 MG tablet TAKE ONE QUARTER (1/4) TABLET BY MOUTH TWICE A DAY 45 tablet 2   Cyanocobalamin  (VITAMIN B-12 PO) Take 3,000 mcg by mouth in the morning. Gummies     famotidine  (PEPCID ) 20 MG tablet Take 1 tablet (20 mg total) by mouth daily. 90 tablet 0   levothyroxine  (SYNTHROID ) 50 MCG tablet Take 1 tablet (50 mcg total) by mouth daily before breakfast. 90 tablet 2   memantine  (NAMENDA ) 10 MG tablet Take 1 tablet (10 mg total) by mouth 2 (two) times daily. 180 tablet 2   nystatin  cream (MYCOSTATIN ) Apply 1 Application topically 2 (two) times daily as needed for dry skin (yeast rash). 30 g 3   rosuvastatin  (CRESTOR ) 10 MG tablet TAKE 1 TABLET BY MOUTH ONCE DAILY 90 tablet 3   No current facility-administered medications on file prior to visit.    Review of Systems  Constitutional:  Positive for fatigue. Negative for appetite change and fever.  HENT:  Positive for congestion, postnasal drip, rhinorrhea, sinus pressure and sneezing. Negative for ear discharge, ear pain and sore throat.   Eyes:  Negative for pain and discharge.  Respiratory:  Positive for cough, chest tightness and wheezing. Negative  for shortness of breath and stridor.   Cardiovascular:  Negative for chest pain.  Gastrointestinal:  Negative for diarrhea, nausea and vomiting.  Genitourinary:  Negative for frequency, hematuria and urgency.  Musculoskeletal:  Negative for arthralgias and myalgias.  Skin:  Negative for rash.  Neurological:  Negative for dizziness, weakness, light-headedness and headaches.  Psychiatric/Behavioral:  Negative for confusion and dysphoric mood.        Objective:   Physical Exam Constitutional:      General: She is not in acute distress.  Appearance: Normal appearance. She is well-developed and normal weight. She is not ill-appearing, toxic-appearing or diaphoretic.  HENT:     Head: Normocephalic and atraumatic.     Comments: Nares are injected and congested      Right Ear: Tympanic membrane, ear canal and external ear normal.     Left Ear: Tympanic membrane, ear canal and external ear normal.     Ears:     Comments: Scant cerumen bilateral    Nose: Congestion and rhinorrhea present.     Mouth/Throat:     Mouth: Mucous membranes are moist.     Pharynx: Oropharynx is clear. No oropharyngeal exudate or posterior oropharyngeal erythema.     Comments: Clear pnd  Eyes:     General:        Right eye: No discharge.        Left eye: No discharge.     Conjunctiva/sclera: Conjunctivae normal.     Pupils: Pupils are equal, round, and reactive to light.  Cardiovascular:     Rate and Rhythm: Normal rate.     Heart sounds: Normal heart sounds.  Pulmonary:     Effort: Pulmonary effort is normal. No respiratory distress.     Breath sounds: No stridor. Wheezing and rhonchi present. No rales.     Comments: Diffusely distant bs Scattered rhonchi Upper airway sounds Wheeze on forced exp (slightly prolonged exp phase)  Chest:     Chest wall: No tenderness.  Musculoskeletal:     Cervical back: Normal range of motion and neck supple.  Lymphadenopathy:     Cervical: No cervical adenopathy.   Skin:    General: Skin is warm and dry.     Capillary Refill: Capillary refill takes less than 2 seconds.     Findings: No rash.  Neurological:     Mental Status: She is alert.     Cranial Nerves: No cranial nerve deficit.  Psychiatric:        Mood and Affect: Mood normal.           Assessment & Plan:   Problem List Items Addressed This Visit       Respiratory   Bronchitis, acute - Primary   In a light smoker (smoked heavier in past)  Prod cough /some wheezing  Reassuring exam  Cxr : no infiltrate/ reassuring  Declines covid testing Prescription prednisone  40 mg taper (aware of side effects) Tessalon  for cough  Prometh  dm for cough- caution of sedation  Pt has albuterol  neb at home for prn use   Update if not starting to improve in a week or if worsening  Call back and Er precautions noted in detail today        Relevant Orders   DG Chest 2 View (Completed)     Other   Smoker   Smokes 2 cig per day  No doubt worsening her acute bronchitis currently -discussed how smoking increase risk for more severe dz   Encouraged strongly to try and quit while she is sick  Cxr ordered: reassuring   Info given re: steps to quit smoking        Relevant Orders   DG Chest 2 View (Completed)

## 2023-02-21 NOTE — Assessment & Plan Note (Addendum)
Smokes 2 cig per day  No doubt worsening her acute bronchitis currently -discussed how smoking increase risk for more severe dz   Encouraged strongly to try and quit while she is sick  Cxr ordered: reassuring   Info given re: steps to quit smoking

## 2023-03-30 ENCOUNTER — Telehealth: Payer: Self-pay | Admitting: Family Medicine

## 2023-03-30 DIAGNOSIS — J441 Chronic obstructive pulmonary disease with (acute) exacerbation: Secondary | ICD-10-CM | POA: Diagnosis not present

## 2023-03-30 DIAGNOSIS — J069 Acute upper respiratory infection, unspecified: Secondary | ICD-10-CM | POA: Diagnosis not present

## 2023-03-30 DIAGNOSIS — R059 Cough, unspecified: Secondary | ICD-10-CM | POA: Diagnosis not present

## 2023-03-30 NOTE — Telephone Encounter (Signed)
 Pt's daughter notified of Dr. Belva Boyden comments records request sent

## 2023-03-30 NOTE — Telephone Encounter (Signed)
 Copied from CRM [id number]. Topic: Clinical - Medication Question >> Mar 30, 2023  1:25 PM Joanell B wrote: Reason for CRM: Pt daughter Mylinda called and stated that the pt is at a urgent care and she has been prescribed albuterol  and wanted to confirm with the pt pcp that the albuterol  is okay for her to take? Pt dughter would like a callback to confirm.

## 2023-03-30 NOTE — Telephone Encounter (Signed)
 I assume she means inhaler or neb treatment?   If so -I don't see anything in chart that would prohibit that unless she had a rxn to it in past I am unaware of  Please send for UC note if not in epic  Follow up if not feeling better

## 2023-04-18 ENCOUNTER — Encounter: Payer: Self-pay | Admitting: Family Medicine

## 2023-04-24 ENCOUNTER — Other Ambulatory Visit: Payer: Self-pay | Admitting: Family Medicine

## 2023-05-03 ENCOUNTER — Encounter: Payer: Self-pay | Admitting: Internal Medicine

## 2023-06-02 ENCOUNTER — Ambulatory Visit: Admitting: *Deleted

## 2023-06-02 ENCOUNTER — Telehealth: Payer: Self-pay | Admitting: *Deleted

## 2023-06-02 VITALS — Ht 59.0 in | Wt 125.0 lb

## 2023-06-02 DIAGNOSIS — Z860100 Personal history of colon polyps, unspecified: Secondary | ICD-10-CM

## 2023-06-02 MED ORDER — NA SULFATE-K SULFATE-MG SULF 17.5-3.13-1.6 GM/177ML PO SOLN: 1.0000 | Freq: Once | ORAL | 0 refills | Status: AC

## 2023-06-02 NOTE — Telephone Encounter (Signed)
Attempt to reach pt for pre-visit. LM with call back #.   Will attempt other number in profile Second attempt reached pt

## 2023-06-02 NOTE — Progress Notes (Signed)
 Pt's name and DOB verified at the beginning of the pre-visit wit 2 identifiers  Permission given to speak with daughter   Pt denies any difficulty with ambulating,sitting, laying down or rolling side to side  Pt has no issues with ambulation   Pt has no issues moving head neck or swallowing  No egg or soy allergy known to patient   No issues known to pt with past sedation with any surgeries or procedures  Pt denies having issues being intubated  No FH of Malignant Hyperthermia  Pt is not on diet pills or shots  Pt is not on home 02   Pt is not on blood thinners   Pt denies issues with constipation   Pt is not on dialysis  Pt denise any abnormal heart rhythms   Pt denies any upcoming cardiac testing  Patient's chart reviewed by Cathlyn Parsons CNRA prior to pre-visit and patient appropriate for the [facility].  Pre-visit completed and red dot placed by patient's name on their procedure day (on provider's schedule).    Visit by phone  Pt states weight is 125 lb   IInstructions reviewed. Pt given , [facility] main # and MD on call # prior to instructions.  Pt states understanding of instructions. Instructed to review again prior to procedure. Pt states they will.

## 2023-06-07 ENCOUNTER — Other Ambulatory Visit

## 2023-06-07 ENCOUNTER — Ambulatory Visit

## 2023-06-19 ENCOUNTER — Encounter: Admitting: Internal Medicine

## 2023-06-27 ENCOUNTER — Encounter: Payer: Self-pay | Admitting: Internal Medicine

## 2023-07-03 ENCOUNTER — Encounter (HOSPITAL_COMMUNITY): Payer: Self-pay

## 2023-07-10 ENCOUNTER — Ambulatory Visit: Admitting: Internal Medicine

## 2023-07-10 ENCOUNTER — Encounter: Payer: Self-pay | Admitting: Internal Medicine

## 2023-07-10 VITALS — BP 130/69 | HR 60 | Temp 98.1°F | Resp 15 | Ht 59.0 in | Wt 129.6 lb

## 2023-07-10 DIAGNOSIS — D125 Benign neoplasm of sigmoid colon: Secondary | ICD-10-CM | POA: Diagnosis not present

## 2023-07-10 DIAGNOSIS — E039 Hypothyroidism, unspecified: Secondary | ICD-10-CM | POA: Diagnosis not present

## 2023-07-10 DIAGNOSIS — Z860101 Personal history of adenomatous and serrated colon polyps: Secondary | ICD-10-CM | POA: Diagnosis not present

## 2023-07-10 DIAGNOSIS — J449 Chronic obstructive pulmonary disease, unspecified: Secondary | ICD-10-CM | POA: Diagnosis not present

## 2023-07-10 DIAGNOSIS — F419 Anxiety disorder, unspecified: Secondary | ICD-10-CM | POA: Diagnosis not present

## 2023-07-10 DIAGNOSIS — K5730 Diverticulosis of large intestine without perforation or abscess without bleeding: Secondary | ICD-10-CM

## 2023-07-10 DIAGNOSIS — D122 Benign neoplasm of ascending colon: Secondary | ICD-10-CM | POA: Diagnosis not present

## 2023-07-10 DIAGNOSIS — Z1211 Encounter for screening for malignant neoplasm of colon: Secondary | ICD-10-CM

## 2023-07-10 DIAGNOSIS — Z860100 Personal history of colon polyps, unspecified: Secondary | ICD-10-CM

## 2023-07-10 DIAGNOSIS — K56609 Unspecified intestinal obstruction, unspecified as to partial versus complete obstruction: Secondary | ICD-10-CM | POA: Diagnosis not present

## 2023-07-10 DIAGNOSIS — I10 Essential (primary) hypertension: Secondary | ICD-10-CM | POA: Diagnosis not present

## 2023-07-10 MED ORDER — SODIUM CHLORIDE 0.9 % IV SOLN: 500.0000 mL | INTRAVENOUS | Status: DC

## 2023-07-10 NOTE — Progress Notes (Signed)
 Pt A/O x 3, gd SR's, pleased with anesthesia, report to RN

## 2023-07-10 NOTE — Progress Notes (Signed)
 Called to room to assist during endoscopic procedure.  Patient ID and intended procedure confirmed with present staff. Received instructions for my participation in the procedure from the performing physician.

## 2023-07-10 NOTE — Progress Notes (Signed)
 Pt's states no medical or surgical changes since previsit or office visit.

## 2023-07-10 NOTE — Progress Notes (Signed)
 HISTORY OF PRESENT ILLNESS:  Tammy Neal is a 73 y.o. 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  Previous examinations 2004, 2010, 2015.  Presents today for surveillance colonoscopy  REVIEW OF SYSTEMS:  All non-GI ROS negative except for  Past Medical History:  Diagnosis Date   Allergy    allergic rhinitis   Anxiety    Arthritis    COPD (chronic obstructive pulmonary disease) (HCC)    GERD (gastroesophageal reflux disease)    Hyperlipidemia    no meds   Hypertension    Hypothyroidism    Neck pain, chronic    with fusion   Osteoporosis    Psoriasis     Past Surgical History:  Procedure Laterality Date   ABDOMINAL HYSTERECTOMY     endometriosis 1 ovary left   APPENDECTOMY     NECK SURGERY     SPINE SURGERY  1977   cervical fusion Dr Joanette Moynahan x2   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   Procedure: LEFT TOTAL HIP ARTHROPLASTY ANTERIOR APPROACH;  Surgeon: Liliane Rei, MD;  Location: [facility];  Service: Orthopedics;  Laterality: Left;   TOTAL HIP ARTHROPLASTY Right 08/11/2021   Procedure: TOTAL HIP ARTHROPLASTY ANTERIOR APPROACH;  Surgeon: Liliane Rei, MD;  Location: [facility];  Service: Orthopedics;  Laterality: Right;    Social History Tammy Neal  reports that she has been smoking cigarettes. She has never used smokeless tobacco. She reports that she does not drink alcohol and does not use drugs.  family history includes Asthma in her father; COPD in her father; Diabetes in her mother; Hypertension in her mother; Stroke (age of onset: 64) in her mother.  Allergies  Allergen Reactions   Voltaren  [Diclofenac  Sodium] Anaphylaxis, Hives and Itching   Aricept  [Donepezil  Hcl] Itching    Itchy, jittery (no hives)   Aspirin Other (See Comments)    REACTION: GI upset   Lipitor [Atorvastatin] Other (See Comments)    REACTION: pain   Zocor [Simvastatin] Other (See Comments)    REACTION: muscle pain       PHYSICAL EXAMINATION: Vital  signs: BP 131/78   Pulse 71   Temp 98.1 F (36.7 C)   Ht 4\' 11"  (1.499 m)   Wt 129 lb 9.6 oz (58.8 kg)   SpO2 94%   BMI 26.18 kg/m  General: Well-developed, well-nourished, no acute distress HEENT: Sclerae are anicteric, conjunctiva pink. Oral mucosa intact Lungs: Clear Heart: Regular Abdomen: soft, nontender, nondistended, no obvious ascites, no peritoneal signs, normal bowel sounds. No organomegaly. Extremities: No edema Psychiatric: alert and oriented x3. Cooperative     ASSESSMENT:  History of adenomatous and sessile serrated polyps   PLAN:  Surveillance colonoscopy

## 2023-07-10 NOTE — Patient Instructions (Signed)
 Discharge instructions given. Handouts on polyps and Diverticulosis. Schedule Virtual colonoscopy (office will schedule) incomplete colonoscopy,clear right colon. Resume previous medications. YOU HAD AN ENDOSCOPIC PROCEDURE TODAY AT THE [facility]:   Refer to the procedure report that was given to you for any specific questions about what was found during the examination.  If the procedure report does not answer your questions, please call your gastroenterologist to clarify.  If you requested that your care partner not be given the details of your procedure findings, then the procedure report has been included in a sealed envelope for you to review at your convenience later.  YOU SHOULD EXPECT: Some feelings of bloating in the abdomen. Passage of more gas than usual.  Walking can help get rid of the air that was put into your GI tract during the procedure and reduce the bloating. If you had a lower endoscopy (such as a colonoscopy or flexible sigmoidoscopy) you may notice spotting of blood in your stool or on the toilet paper. If you underwent a bowel prep for your procedure, you may not have a normal bowel movement for a few days.  Please Note:  You might notice some irritation and congestion in your nose or some drainage.  This is from the oxygen used during your procedure.  There is no need for concern and it should clear up in a day or so.  SYMPTOMS TO REPORT IMMEDIATELY:  Following lower endoscopy (colonoscopy or flexible sigmoidoscopy):  Excessive amounts of blood in the stool  Significant tenderness or worsening of abdominal pains  Swelling of the abdomen that is new, acute  Fever of 100F or higher   For urgent or emergent issues, a gastroenterologist can be reached at any hour by calling [phone]. Do not use MyChart messaging for urgent concerns.    DIET:  We do recommend a small meal at first, but then you may proceed to your regular diet.  Drink plenty of fluids  but you should avoid alcoholic beverages for 24 hours.  ACTIVITY:  You should plan to take it easy for the rest of today and you should NOT DRIVE or use heavy machinery until tomorrow (because of the sedation medicines used during the test).    FOLLOW UP: Our staff will call the number listed on your records the next business day following your procedure.  We will call around 7:15- 8:00 am to check on you and address any questions or concerns that you may have regarding the information given to you following your procedure. If we do not reach you, we will leave a message.     If any biopsies were taken you will be contacted by phone or by letter within the next 1-3 weeks.  Please call us  at [phone] if you have not heard about the biopsies in 3 weeks.    SIGNATURES/CONFIDENTIALITY: You and/or your care partner have signed paperwork which will be entered into your electronic medical record.  These signatures attest to the fact that that the information above on your After Visit Summary has been reviewed and is understood.  Full responsibility of the confidentiality of this discharge information lies with you and/or your care-partner.

## 2023-07-10 NOTE — Op Note (Signed)
 [facility] Patient Name: Tammy Neal Procedure Date: 07/10/2023 12:14 PM MRN: [id number] Endoscopist: Murel Arlington. Elvin Hammer , MD, [id number] Age: 73 Referring MD:  Date of Birth: [birth year] Gender: Female Account #: [id number] Procedure:                Colonoscopy with cold snare polypectomy x 2 Indications:              High risk colon cancer surveillance: Personal                            history of non-advanced adenoma, High risk colon                            cancer surveillance: Personal history of sessile                            serrated colon polyp (less than 10 mm in size) with                            no dysplasia. Previous examinations 2004, 2010,                            2015. 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 The patient's tolerance of                            previous anesthesia was also reviewed. The risks                            and benefits of the procedure and the sedation                            options and risks were discussed with the patient.                            All questions were answered, and informed consent                            was obtained. Prior Anticoagulants: The patient has  taken no anticoagulant or antiplatelet agents. ASA                            Grade Assessment: II - A patient with mild systemic                            disease. After reviewing the risks and benefits,                            the patient was deemed in satisfactory condition to                            undergo the procedure.                            After obtaining informed consent, the colonoscope                            was passed under direct vision. Throughout the                            procedure, the patient's blood pressure, pulse, and                            oxygen saturations were monitored continuously. The                            [id number]-[id number] Slim SN [id number] was introduced through                            the anus and advanced to the the right colon with                            ileocecal valve seen at a distance. Imaged. The                            ileocecal valve, and rectum were photographed. The                            quality of the bowel preparation was excellent. The                            colonoscopy was performed with difficulty                            (rectosigmoid stenosis). The patient tolerated the                            procedure well. The bowel preparation used was                            SUPREP via split dose instruction. Scope In: 12:20:13 PM Scope Out: 12:46:44 PM Scope Withdrawal Time: 0 hours 16 minutes 7 seconds  Total Procedure Duration: 0 hours 26 minutes 31 seconds  Findings:                 Two polyps were found in the sigmoid colon and                            ascending colon. The polyps were 3 to 4 mm in size.                            These polyps were removed with a cold snare.                            Resection and retrieval were complete.                           Many diverticula were found in the left colon and                            right colon. There was severe stenosis in the                            rectosigmoid region. This was eventually traversed                            using the ultraslim colonoscope, water , and change                            in body position. The cecum could not be deeply                            intubated                           The exam was otherwise without abnormality on                            direct  and retroflexion views. Complications:            No immediate complications. Estimated blood loss:                            None. Estimated Blood Loss:     Estimated blood loss: none. Impression:               - Two 3 to 4 mm polyps in the sigmoid colon and in                            the ascending colon, removed with a cold snare.                            Resected and retrieved.                           - Diverticulosis in the left colon and in the right  colon.                           - The examination was otherwise normal on direct                            and retroflexion views. Recommendation:           - Repeat colonoscopy for routine surveillance not                            recommended if cecum cleared with imaging.                           - Patient has a contact number available for                            emergencies. The signs and symptoms of potential                            delayed complications were discussed with the                            patient. Return to normal activities tomorrow.                            Written discharge instructions were provided to the                            patient.                           - Resume previous diet.                           - Continue present medications.                           - Schedule virtual colonoscopy "incomplete                            colonoscopy, clear right colon"                           - Await pathology results. Murel Arlington. Elvin Hammer, MD 07/10/2023 12:58:40 PM This report has been signed electronically.

## 2023-07-11 ENCOUNTER — Telehealth: Payer: Self-pay | Admitting: *Deleted

## 2023-07-11 NOTE — Telephone Encounter (Signed)
  Follow up Call-     07/10/2023   11:18 AM  Call back number  Post procedure Call Back phone  # [phone] [name]  Permission to leave phone message Yes     Patient questions:  Do you have a fever, pain , or abdominal swelling? No. Pain Score  0 *  Have you tolerated food without any problems? Yes.    Have you been able to return to your normal activities? Yes.    Do you have any questions about your discharge instructions: Diet   No. Medications  No. Follow up visit  No.  Do you have questions or concerns about your Care? No.  Actions: * If pain score is 4 or above: No action needed, pain <4.

## 2023-07-12 ENCOUNTER — Ambulatory Visit: Payer: Self-pay | Admitting: Internal Medicine

## 2023-07-12 ENCOUNTER — Other Ambulatory Visit: Payer: Self-pay

## 2023-07-12 DIAGNOSIS — Z860100 Personal history of colon polyps, unspecified: Secondary | ICD-10-CM

## 2023-07-12 LAB — SURGICAL PATHOLOGY

## 2023-07-14 ENCOUNTER — Telehealth: Payer: Self-pay

## 2023-07-14 NOTE — Telephone Encounter (Signed)
 error

## 2023-07-19 ENCOUNTER — Encounter: Payer: Self-pay | Admitting: Family Medicine

## 2023-07-19 ENCOUNTER — Telehealth: Payer: Self-pay | Admitting: *Deleted

## 2023-07-19 NOTE — Telephone Encounter (Signed)
 Pt's daughter sent a message saying:   I wanted to reach out to you about Dr. Elvin Hammer requesting a Virtual Colonoscopy. I made the appointment for June 23rd with hesitation. Mom had a colonoscopy on May 19 with Dr Elvin Hammer. I am sure you have seen the notes and results. He is requesting the Virtual Colonoscopy to look further into an area that was to narrow to see. [name] and I's concern is the toll that this procedure took on mom. We realize she will not be put to sleep this time. But the prep was pretty rough on mom. As you know her dementia is progressing along. The prep of having to drink the solution and then the aftermath of what drinking it does was tough to navigate with her. She didn't understand why she was having to drink the awful stuff. She didn't understand why she was in and out of the bathroom so much or why she was having accidents because she didn't make it to the bathroom. She cried and ask us  1000 times what was wrong with her. She was scared and confused. She even ask if this was the end. Was she dying.  With all that said learning that even with the Virtual Colonoscopy she would have to go through this same process we ask the question is this 100% necessary? If it is we will certainly follow through. Mom has always trusted you and your word and we are trying to do what's best for her in all areas of her well being. Because mom trust you we wanted you thoughts on this even though we know you are not the referring doctor on this situation.  Thank you,   Christina Coyer

## 2023-07-19 NOTE — Telephone Encounter (Signed)
 I don't think it is worth putting her through the prep  again for screening purposes  Of course there is a risk for missing something but It sounds like the prep was just intolerable  Of course you can reach out to Dr Elvin Hammer and see if he has other options or to see if the prep / procedure would be easier  Thanks so much for letting me know and sorry I am out of town this week

## 2023-07-20 NOTE — Telephone Encounter (Signed)
 Sent mychart letting pt/family know Dr. Belva Boyden comments

## 2023-08-14 ENCOUNTER — Other Ambulatory Visit

## 2023-08-17 ENCOUNTER — Ambulatory Visit: Payer: Medicare HMO | Admitting: Dermatology

## 2023-08-17 ENCOUNTER — Encounter: Payer: Self-pay | Admitting: Dermatology

## 2023-08-17 VITALS — BP 117/72 | HR 70

## 2023-08-17 DIAGNOSIS — D492 Neoplasm of unspecified behavior of bone, soft tissue, and skin: Secondary | ICD-10-CM

## 2023-08-17 DIAGNOSIS — L720 Epidermal cyst: Secondary | ICD-10-CM

## 2023-08-17 DIAGNOSIS — D485 Neoplasm of uncertain behavior of skin: Secondary | ICD-10-CM

## 2023-08-17 MED ORDER — DOXYCYCLINE HYCLATE 100 MG PO CAPS
100.0000 mg | ORAL_CAPSULE | Freq: Two times a day (BID) | ORAL | 0 refills | Status: AC
Start: 2023-08-17 — End: 2023-08-22

## 2023-08-17 MED ORDER — MUPIROCIN 2 % EX OINT: 1.0000 | TOPICAL_OINTMENT | Freq: Two times a day (BID) | CUTANEOUS | 0 refills | Status: AC

## 2023-08-17 NOTE — Patient Instructions (Addendum)

## 2023-08-17 NOTE — Progress Notes (Signed)
   New Patient Visit   Subjective  Tammy Neal is a 73 y.o. female who presents for the following: grothe on right upper cheek Pt has growth on right upper cheek for several years that is bothersome. Pt has no hx of skin cancer. No family hx of MM.     The following portions of the chart were reviewed this encounter and updated as appropriate: medications, allergies, medical history  Review of Systems:  No other skin or systemic complaints except as noted in HPI or Assessment and Plan.  Objective  Well appearing patient in no apparent distress; mood and affect are within normal limits.  A focused examination was performed of the following areas: face  Relevant exam findings are noted in the Assessment and Plan.  Right zygoma 2 cm flesh colored papule    Assessment & Plan   1. Papule on Right Zygoma - Assessment: 2-centimeter, flush-colored papule on the right zygoma, present for a long time. Previously unsuccessfully lanced by Dr. Randeen. No recent growth. Not painful but aggravating to patient. Differential diagnoses include cyst, large sebaceous hyperplasia, or milia. Skin cancer unlikely but not completely ruled out.  - Plan:    Perform excision:     - Use dermoblade after local anesthesia with lidocaine      - Send excised tissue for histopathological examination    Post-procedure care:     - Apply Aquaphor to excision site     - Prescribe mupirocin  ointment for topical application twice daily     - Prescribe doxycycline  100 mg PO BID with food for 5 days     - Apply pressure dressing with mupirocin  ointment    Patient instructions:     - Keep area covered with circular bandages until fully healed  NEOPLASM OF UNCERTAIN BEHAVIOR OF SKIN Right zygoma Skin / nail biopsy Type of biopsy: tangential   Specimen 1 - Surgical pathology Differential Diagnosis: R/O cyst vs large sebaceous hyperplasia vs milia vs NMSC  Check Margins: No  No follow-ups on file.  LILLETTE Darice Smock, CMA, am acting as scribe for [employer], DO.   Documentation: I have reviewed the above documentation for accuracy and completeness, and I agree with the above.  Delon Lenis, DO

## 2023-08-18 LAB — SURGICAL PATHOLOGY

## 2023-08-22 ENCOUNTER — Other Ambulatory Visit: Payer: Self-pay | Admitting: Family Medicine

## 2023-08-23 NOTE — Telephone Encounter (Signed)
 Pt is due for CPE (labs prior) on or after 09/07/23, please schedule (med refilled once)

## 2023-08-27 ENCOUNTER — Ambulatory Visit: Payer: Self-pay | Admitting: Dermatology

## 2023-09-12 ENCOUNTER — Other Ambulatory Visit: Payer: Self-pay | Admitting: Family Medicine

## 2023-09-13 NOTE — Telephone Encounter (Signed)
 Pt is due for her CPE (labs prior), please schedule and then route back to me to refill. Thanks

## 2023-10-08 ENCOUNTER — Telehealth: Payer: Self-pay | Admitting: Family Medicine

## 2023-10-08 DIAGNOSIS — I10 Essential (primary) hypertension: Secondary | ICD-10-CM

## 2023-10-08 DIAGNOSIS — E7800 Pure hypercholesterolemia, unspecified: Secondary | ICD-10-CM

## 2023-10-08 DIAGNOSIS — R7989 Other specified abnormal findings of blood chemistry: Secondary | ICD-10-CM

## 2023-10-08 DIAGNOSIS — E039 Hypothyroidism, unspecified: Secondary | ICD-10-CM

## 2023-10-08 NOTE — Telephone Encounter (Signed)
-----   Message from Harlene Du sent at 09/27/2023  2:14 PM EDT ----- Regarding: Lab Fri 10/13/23 Hello,  Patient is coming in for CPE labs on Friday 10/13/23. Can we get orders please.   Thanks

## 2023-10-13 ENCOUNTER — Other Ambulatory Visit (INDEPENDENT_AMBULATORY_CARE_PROVIDER_SITE_OTHER)

## 2023-10-13 DIAGNOSIS — I10 Essential (primary) hypertension: Secondary | ICD-10-CM | POA: Diagnosis not present

## 2023-10-13 DIAGNOSIS — E7800 Pure hypercholesterolemia, unspecified: Secondary | ICD-10-CM

## 2023-10-13 DIAGNOSIS — R7989 Other specified abnormal findings of blood chemistry: Secondary | ICD-10-CM | POA: Diagnosis not present

## 2023-10-13 DIAGNOSIS — E039 Hypothyroidism, unspecified: Secondary | ICD-10-CM | POA: Diagnosis not present

## 2023-10-13 LAB — COMPREHENSIVE METABOLIC PANEL WITH GFR
ALT: 9 U/L (ref 0–35)
AST: 14 U/L (ref 0–37)
Albumin: 4.1 g/dL (ref 3.5–5.2)
Alkaline Phosphatase: 70 U/L (ref 39–117)
BUN: 8 mg/dL (ref 6–23)
CO2: 33 meq/L — ABNORMAL HIGH (ref 19–32)
Calcium: 9 mg/dL (ref 8.4–10.5)
Chloride: 104 meq/L (ref 96–112)
Creatinine, Ser: 0.73 mg/dL (ref 0.40–1.20)
GFR: 81.12 mL/min (ref >60.00)
Glucose, Bld: 91 mg/dL (ref 70–99)
Potassium: 4.3 meq/L (ref 3.5–5.1)
Sodium: 144 meq/L (ref 135–145)
Total Bilirubin: 0.7 mg/dL (ref 0.2–1.2)
Total Protein: 6.5 g/dL (ref 6.0–8.3)

## 2023-10-13 LAB — CBC WITH DIFFERENTIAL/PLATELET
Basophils Absolute: 0 K/uL (ref 0.0–0.1)
Basophils Relative: 0.6 % (ref 0.0–3.0)
Eosinophils Absolute: 0.2 K/uL (ref 0.0–0.7)
Eosinophils Relative: 2.3 % (ref 0.0–5.0)
HCT: 44.6 % (ref 36.0–46.0)
Hemoglobin: 14.6 g/dL (ref 12.0–15.0)
Lymphocytes Relative: 20.8 % (ref 12.0–46.0)
Lymphs Abs: 1.6 K/uL (ref 0.7–4.0)
MCHC: 32.7 g/dL (ref 30.0–36.0)
MCV: 97.3 fl (ref 78.0–100.0)
Monocytes Absolute: 0.8 K/uL (ref 0.1–1.0)
Monocytes Relative: 10.2 % (ref 3.0–12.0)
Neutro Abs: 4.9 K/uL (ref 1.4–7.7)
Neutrophils Relative %: 66.1 % (ref 43.0–77.0)
Platelets: 244 K/uL (ref 150.0–400.0)
RBC: 4.58 Mil/uL (ref 3.87–5.11)
RDW: 13.6 % (ref 11.5–15.5)
WBC: 7.5 K/uL (ref 4.0–10.5)

## 2023-10-13 LAB — LIPID PANEL
Cholesterol: 158 mg/dL (ref 0–200)
HDL: 50.6 mg/dL (ref >39.00)
LDL Cholesterol: 74 mg/dL (ref 0–99)
NonHDL: 107.14
Total CHOL/HDL Ratio: 3
Triglycerides: 166 mg/dL — ABNORMAL HIGH (ref 0.0–149.0)
VLDL: 33.2 mg/dL (ref 0.0–40.0)

## 2023-10-13 LAB — VITAMIN B12: Vitamin B-12: 685 pg/mL (ref 211–911)

## 2023-10-13 LAB — TSH: TSH: 2.78 u[IU]/mL (ref 0.35–5.50)

## 2023-10-15 ENCOUNTER — Ambulatory Visit: Payer: Self-pay | Admitting: Family Medicine

## 2023-10-20 ENCOUNTER — Encounter: Admitting: Family Medicine

## 2023-11-10 ENCOUNTER — Encounter: Payer: Self-pay | Admitting: Family Medicine

## 2023-11-10 ENCOUNTER — Ambulatory Visit (INDEPENDENT_AMBULATORY_CARE_PROVIDER_SITE_OTHER): Admitting: Family Medicine

## 2023-11-10 ENCOUNTER — Ambulatory Visit

## 2023-11-10 ENCOUNTER — Telehealth: Payer: Self-pay | Admitting: *Deleted

## 2023-11-10 VITALS — Ht 59.5 in | Wt 129.0 lb

## 2023-11-10 VITALS — BP 130/64 | HR 65 | Temp 98.1°F | Ht 59.0 in | Wt 130.2 lb

## 2023-11-10 DIAGNOSIS — Z23 Encounter for immunization: Secondary | ICD-10-CM | POA: Diagnosis not present

## 2023-11-10 DIAGNOSIS — E2839 Other primary ovarian failure: Secondary | ICD-10-CM | POA: Diagnosis not present

## 2023-11-10 DIAGNOSIS — I10 Essential (primary) hypertension: Secondary | ICD-10-CM

## 2023-11-10 DIAGNOSIS — E7800 Pure hypercholesterolemia, unspecified: Secondary | ICD-10-CM

## 2023-11-10 DIAGNOSIS — Z0000 Encounter for general adult medical examination without abnormal findings: Secondary | ICD-10-CM

## 2023-11-10 DIAGNOSIS — E039 Hypothyroidism, unspecified: Secondary | ICD-10-CM | POA: Diagnosis not present

## 2023-11-10 DIAGNOSIS — I700 Atherosclerosis of aorta: Secondary | ICD-10-CM

## 2023-11-10 DIAGNOSIS — R7989 Other specified abnormal findings of blood chemistry: Secondary | ICD-10-CM

## 2023-11-10 DIAGNOSIS — F411 Generalized anxiety disorder: Secondary | ICD-10-CM

## 2023-11-10 DIAGNOSIS — F17200 Nicotine dependence, unspecified, uncomplicated: Secondary | ICD-10-CM

## 2023-11-10 DIAGNOSIS — G3184 Mild cognitive impairment, so stated: Secondary | ICD-10-CM

## 2023-11-10 MED ORDER — FAMOTIDINE 20 MG PO TABS: ORAL_TABLET | ORAL | 3 refills | Status: AC

## 2023-11-10 MED ORDER — ALBUTEROL SULFATE HFA 108 (90 BASE) MCG/ACT IN AERS: 2.0000 | INHALATION_SPRAY | RESPIRATORY_TRACT | 5 refills | Status: AC | PRN

## 2023-11-10 MED ORDER — LEVOTHYROXINE SODIUM 50 MCG PO TABS: 50.0000 ug | ORAL_TABLET | Freq: Every day | ORAL | 3 refills | Status: AC

## 2023-11-10 MED ORDER — AMLODIPINE BESYLATE 5 MG PO TABS: 5.0000 mg | ORAL_TABLET | Freq: Every day | ORAL | 3 refills | Status: AC

## 2023-11-10 MED ORDER — BUSPIRONE HCL 15 MG PO TABS: ORAL_TABLET | ORAL | 3 refills | Status: AC

## 2023-11-10 MED ORDER — MEMANTINE HCL 10 MG PO TABS: 10.0000 mg | ORAL_TABLET | Freq: Two times a day (BID) | ORAL | 3 refills | Status: AC

## 2023-11-10 MED ORDER — ROSUVASTATIN CALCIUM 10 MG PO TABS: ORAL_TABLET | ORAL | 3 refills | Status: AC

## 2023-11-10 NOTE — Assessment & Plan Note (Signed)
 Dexa ordered

## 2023-11-10 NOTE — Progress Notes (Signed)
 Subjective:    Patient ID: Tammy Neal, female    DOB: 08-06-1950, 73 y.o.   MRN: [id number]  HPI  Here for health maintenance exam and to review chronic medical problems   Wt Readings from Last 3 Encounters:  11/10/23 130 lb 4 oz (59.1 kg)  11/10/23 129 lb (58.5 kg)  07/10/23 129 lb 9.6 oz (58.8 kg)   26.31 kg/m  Vitals:   11/10/23 1559  BP: 130/64  Pulse: 65  Temp: 98.1 F (36.7 C)  SpO2: 96%    Immunization History  Administered Date(s) Administered   Fluad Quad(high Dose 65+) 05/05/2021   INFLUENZA, HIGH DOSE SEASONAL PF 11/10/2023   Influenza Split 11/02/2011   Influenza Whole 02/21/1997, 11/22/2006, 11/22/2007   Influenza,inj,Quad PF,6+ Mos 11/25/2014, 10/30/2015, 11/04/2016, 01/04/2018   PFIZER(Purple Top)SARS-COV-2 Vaccination 04/21/2019, 05/14/2019   PNEUMOCOCCAL CONJUGATE-20 09/06/2022   Pneumococcal Conjugate-13 10/30/2015   Pneumococcal Polysaccharide-23 10/30/2002, 05/03/2010   Td 04/25/2005   Zoster, Live 07/06/2010    There are no preventive care reminders to display for this patient.   Flu shot today   Needs tetanus shot   Shingrix -had zostavax    Mammogram 2022 Declines another  Self breast exam -no lumps   Gyn health No problems   Colon cancer screening  Colonoscopy 06/2023   5 y recall if applicable   Bone health  Dexa 2017  Falls-none  Fractures-none  Supplements -has been off ca since her colonoscopy    Exercise  Depends on the day  Stays busy and on her feet all day  Spends alternate weeks with family members  Does some strength training with her daughter    Smoking status  3 cig per day     Mood    11/10/2023    4:42 PM 11/10/2023    1:01 PM 09/01/2022    2:25 PM 08/27/2021    2:36 PM 12/29/2020    3:15 PM  Depression screen PHQ 2/9  Decreased Interest 0 0 0 0 0  Down, Depressed, Hopeless 0 0 0 0 0  PHQ - 2 Score 0 0 0 0 0  Altered sleeping 0      Tired, decreased energy 0      Change in appetite 0       Feeling bad or failure about yourself  0      Trouble concentrating 0      Moving slowly or fidgety/restless 0      Suicidal thoughts 0      PHQ-9 Score 0      Difficult doing work/chores Not difficult at all       History of anxiety disorder Buspar  15 mg  quarter tab bid   MCI Worse in larger groups / or if anxious  Taking namenda  10 mg bid   Spends time with family and grandkids   HTN bp is stable today  No cp or palpitations or headaches or edema  No side effects to medicines  BP Readings from Last 3 Encounters:  11/10/23 130/64  08/17/23 117/72  07/10/23 130/69    Amlodipine  5 mg daily  Lab Results  Component Value Date   NA 144 10/13/2023   K 4.3 10/13/2023   CO2 33 (H) 10/13/2023   GLUCOSE 91 10/13/2023   BUN 8 10/13/2023   CREATININE 0.73 10/13/2023   CALCIUM  9.0 10/13/2023   GFR 81.12 10/13/2023   GFRNONAA >60 08/12/2021    Hypothyroidism  Pt has no clinical changes No change in energy  level/ hair or skin/ edema and no tremor Lab Results  Component Value Date   TSH 2.78 10/13/2023     Levothyroxine  50 mcg daily   Hyperlipidemia Lab Results  Component Value Date   CHOL 158 10/13/2023   CHOL 161 01/09/2023   CHOL 245 (H) 12/13/2022   Lab Results  Component Value Date   HDL 50.60 10/13/2023   HDL 47.30 01/09/2023   HDL 46.50 12/13/2022   Lab Results  Component Value Date   LDLCALC 74 10/13/2023   LDLCALC 71 01/09/2023   LDLCALC 150 (H) 12/13/2022   Lab Results  Component Value Date   TRIG 166.0 (H) 10/13/2023   TRIG 215.0 (H) 01/09/2023   TRIG 243.0 (H) 12/13/2022   Lab Results  Component Value Date   CHOLHDL 3 10/13/2023   CHOLHDL 3 01/09/2023   CHOLHDL 5 12/13/2022   Lab Results  Component Value Date   LDLDIRECT 88.0 09/06/2022   LDLDIRECT 135.0 04/29/2020   LDLDIRECT 121.0 04/16/2019   Crestor  10 mg daily  Eating less sugar   Appetite is lower     Lab Results  Component Value Date   ALT 9 10/13/2023   AST 14  10/13/2023   ALKPHOS 70 10/13/2023   BILITOT 0.7 10/13/2023    B12 def Lab Results  Component Value Date   VITAMINB12 685 10/13/2023      Patient Active Problem List   Diagnosis Date Noted   History of colon polyps 09/06/2022   Osteoarthritis of right hip 08/11/2021   Pill dysphagia 07/05/2021   Right hip pain 12/29/2020   Hypothyroid 06/23/2020   Essential hypertension 04/29/2020   Spondylosis without myelopathy or radiculopathy, cervical region 02/28/2020   Low vitamin B12 level 03/20/2018   Anxiety disorder 03/20/2018   OA (osteoarthritis) of hip 11/16/2016   Aortic atherosclerosis (HCC) 11/06/2016   Arthritis of left hip 07/13/2016   Estrogen deficiency 12/29/2015   Mild cognitive impairment 01/02/2015   Intertrigo 11/02/2011   Routine general medical examination at a health care facility 10/26/2011   Encounter for gynecological examination 07/05/2010   Neck pain 06/10/2008   Hyperlipidemia 02/26/2007   Smoker 02/26/2007   Psoriasis 02/26/2007   Past Medical History:  Diagnosis Date   Allergy    allergic rhinitis   Anxiety    Arthritis    COPD (chronic obstructive pulmonary disease) (HCC)    GERD (gastroesophageal reflux disease)    Hyperlipidemia    no meds   Hypertension    Hypothyroidism    Neck pain, chronic    with fusion   Osteoporosis    Psoriasis    Past Surgical History:  Procedure Laterality Date   ABDOMINAL HYSTERECTOMY     endometriosis 1 ovary left   APPENDECTOMY     NECK SURGERY     SPINE SURGERY  1977   cervical fusion Dr Gaither x2   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   Procedure: LEFT TOTAL HIP ARTHROPLASTY ANTERIOR APPROACH;  Surgeon: Melodi Lerner, MD;  Location: [facility];  Service: Orthopedics;  Laterality: Left;   TOTAL HIP ARTHROPLASTY Right 08/11/2021   Procedure: TOTAL HIP ARTHROPLASTY ANTERIOR APPROACH;  Surgeon: Melodi Lerner, MD;  Location: [facility];  Service: Orthopedics;  Laterality: Right;   Social History   Tobacco  Use   Smoking status: Some Days    Types: Cigarettes   Smokeless tobacco: Never  Vaping Use   Vaping status: Never Used  Substance Use Topics   Alcohol use: No   Drug  use: No   Family History  Problem Relation Age of Onset   Hypertension Mother    Diabetes Mother    Stroke Mother 70   Asthma Father    COPD Father    Colon cancer Neg Hx    Esophageal cancer Neg Hx    Ulcerative colitis Neg Hx    Stomach cancer Neg Hx    Colon polyps Neg Hx    Allergies  Allergen Reactions   Voltaren  [Diclofenac  Sodium] Anaphylaxis, Hives and Itching   Aricept  [Donepezil  Hcl] Itching    Itchy, jittery (no hives)   Aspirin Other (See Comments)    REACTION: GI upset   Lipitor [Atorvastatin] Other (See Comments)    REACTION: pain   Zocor [Simvastatin] Other (See Comments)    REACTION: muscle pain   Current Outpatient Medications on File Prior to Visit  Medication Sig Dispense Refill   acetaminophen  (TYLENOL ) 160 MG/5ML liquid Take 480 mg by mouth every 4 (four) hours as needed for pain.     calcium  carbonate (TITRALAC) 420 MG CHEW chewable tablet Chew 420 mg by mouth.     Cholecalciferol (VITAMIN D3) 10 MCG (400 UNIT) CHEW Chew by mouth.     Cyanocobalamin  (VITAMIN B-12 PO) Take 3,000 mcg by mouth in the morning. Gummies     menthol -zinc oxide (GOLD BOND) powder Apply topically.     mupirocin  ointment (BACTROBAN ) 2 % Apply 1 Application topically 2 (two) times daily. 22 g 0   nystatin  cream (MYCOSTATIN ) Apply 1 Application topically 2 (two) times daily as needed for dry skin (yeast rash). 30 g 3   Salicylic Acid (CERAVE PSORIASIS) 2 % CREA Apply topically.     No current facility-administered medications on file prior to visit.    Review of Systems  Constitutional:  Negative for activity change, appetite change, fatigue, fever and unexpected weight change.  HENT:  Negative for congestion, ear pain, rhinorrhea, sinus pressure and sore throat.   Eyes:  Negative for pain, redness and  visual disturbance.  Respiratory:  Negative for cough, shortness of breath and wheezing.   Cardiovascular:  Negative for chest pain and palpitations.  Gastrointestinal:  Negative for abdominal pain, blood in stool, constipation and diarrhea.  Endocrine: Negative for polydipsia and polyuria.  Genitourinary:  Negative for dysuria, frequency and urgency.  Musculoskeletal:  Negative for arthralgias, back pain and myalgias.  Skin:  Negative for pallor and rash.  Allergic/Immunologic: Negative for environmental allergies.  Neurological:  Negative for dizziness, syncope and headaches.  Hematological:  Negative for adenopathy. Does not bruise/bleed easily.  Psychiatric/Behavioral:  Positive for decreased concentration. Negative for dysphoric mood. The patient is nervous/anxious.        Objective:   Physical Exam Constitutional:      General: She is not in acute distress.    Appearance: Normal appearance. She is well-developed and normal weight. She is not ill-appearing or diaphoretic.  HENT:     Head: Normocephalic and atraumatic.     Right Ear: Tympanic membrane, ear canal and external ear normal.     Left Ear: Tympanic membrane, ear canal and external ear normal.     Nose: Nose normal. No congestion.     Mouth/Throat:     Mouth: Mucous membranes are moist.     Pharynx: Oropharynx is clear. No posterior oropharyngeal erythema.  Eyes:     General: No scleral icterus.    Extraocular Movements: Extraocular movements intact.     Conjunctiva/sclera: Conjunctivae normal.     Pupils:  Pupils are equal, round, and reactive to light.  Neck:     Thyroid : No thyromegaly.     Vascular: No carotid bruit or JVD.  Cardiovascular:     Rate and Rhythm: Normal rate and regular rhythm.     Pulses: Normal pulses.     Heart sounds: Normal heart sounds.     No gallop.  Pulmonary:     Effort: Pulmonary effort is normal. No respiratory distress.     Breath sounds: Normal breath sounds. No wheezing.      Comments: Good air exch Chest:     Chest wall: No tenderness.  Abdominal:     General: Bowel sounds are normal. There is no distension or abdominal bruit.     Palpations: Abdomen is soft. There is no mass.     Tenderness: There is no abdominal tenderness.     Hernia: No hernia is present.  Genitourinary:    Comments: Breast exam: No mass, nodules, thickening, tenderness, bulging, retraction, inflamation, nipple discharge or skin changes noted.  No axillary or clavicular LA.     Musculoskeletal:        General: No tenderness. Normal range of motion.     Cervical back: Normal range of motion and neck supple. No rigidity. No muscular tenderness.     Right lower leg: No edema.     Left lower leg: No edema.     Comments: No kyphosis   Lymphadenopathy:     Cervical: No cervical adenopathy.  Skin:    General: Skin is warm and dry.     Coloration: Skin is not pale.     Findings: No erythema or rash.     Comments: Solar lentigines diffusely   Neurological:     Mental Status: She is alert. Mental status is at baseline.     Cranial Nerves: No cranial nerve deficit.     Motor: No abnormal muscle tone.     Coordination: Coordination normal.     Gait: Gait normal.     Deep Tendon Reflexes: Reflexes are normal and symmetric. Reflexes normal.  Psychiatric:        Mood and Affect: Mood normal.        Cognition and Memory: Cognition and memory normal.     Comments: Baseline mci  In good spirits today           Assessment & Plan:   Problem List Items Addressed This Visit       Cardiovascular and Mediastinum   Essential hypertension   bp in fair control at this time  BP Readings from Last 1 Encounters:  11/10/23 130/64   No changes needed Most recent labs reviewed  Disc lifstyle change with low sodium diet and exercise  Continues amlodipine  5 mg daily      Relevant Medications   amLODipine  (NORVASC ) 5 MG tablet   rosuvastatin  (CRESTOR ) 10 MG tablet   Aortic atherosclerosis  (HCC)   Lab for lipids Blood pressure is well controlled No symptoms      Relevant Medications   amLODipine  (NORVASC ) 5 MG tablet   rosuvastatin  (CRESTOR ) 10 MG tablet     Endocrine   Hypothyroid   Hypothyroidism  Pt has no clinical changes No change in energy level/ hair or skin/ edema and no tremor Lab Results  Component Value Date   TSH 2.78 10/13/2023          Relevant Medications   levothyroxine  (SYNTHROID ) 50 MCG tablet     Other  Smoker   3 cig per day max Disc in detail risks of smoking and possible outcomes including copd, vascular/ heart disease, cancer , respiratory and sinus infections as well as osteoporosis  Pt voices understanding  Pt is not ready to quit       Routine general medical examination at a health care facility - Primary   Reviewed health habits including diet and exercise and skin cancer prevention Reviewed appropriate screening tests for age  Also reviewed health mt list, fam hx and immunization status , as well as social and family history   See HPI Labs reviewed and ordered Health Maintenance  Topic Date Due   COVID-19 Vaccine (3 - Pfizer risk series) 09/21/2024*   DTaP/Tdap/Td vaccine (2 - Tdap) 11/09/2024*   Breast Cancer Screening  11/09/2024*   Zoster (Shingles) Vaccine (1 of 2) 12/06/2024*   Medicare Annual Wellness Visit  11/09/2024   Colon Cancer Screening  07/09/2028   Pneumococcal Vaccine for age over 24  Completed   Flu Shot  Completed   DEXA scan (bone density measurement)  Completed   Hepatitis C Screening  Completed   HPV Vaccine  Aged Out   Meningitis B Vaccine  Aged Out  *Topic was postponed. The date shown is not the original due date.    Flu shot given Considering Td and shingrix at pharmacy  Declines mammogram  Discussed fall prevention, supplements and exercise for bone density  Dexa ordered  PHQ 0      Mild cognitive impairment   Continues namenda  10 mg bid  Per family not much - worse when fatigued  or overwhelmed  Works hard to keep a routine and familiar surroundings       Low vitamin B12 level   Lab Results  Component Value Date   VITAMINB12 685 10/13/2023   Continue oral supplementatoin       Hyperlipidemia   Disc goals for lipids and reasons to control them Rev last labs with pt Rev low sat fat diet in detail Labs ordered today  Taking crestor  10 mg daily LDL 74       Relevant Medications   amLODipine  (NORVASC ) 5 MG tablet   rosuvastatin  (CRESTOR ) 10 MG tablet   Estrogen deficiency   Dexa ordered       Relevant Orders   DG Bone Density   Anxiety disorder   buspar  continues to help her  Plan to continue it  Struggling with slow memory loss  Mood is good today        Relevant Medications   busPIRone  (BUSPAR ) 15 MG tablet   Other Visit Diagnoses       Need for influenza vaccination       Relevant Orders   Flu vaccine HIGH DOSE PF(Fluzone Trivalent) (Completed)

## 2023-11-10 NOTE — Patient Instructions (Addendum)
 You can ask about a tetanus shot at the pharmacy   If you are interested in the new shingles vaccine (Shingrix) - call your local pharmacy to check on coverage and availability   Get back on your calcium   Continue the vitamin D   Stay active Walking is great  Add some strength training to your routine, this is important for bone and brain health and can reduce your risk of falls and help your body use insulin properly and regulate weight  Light weights, exercise bands , and internet videos are a good way to start  Yoga (chair or regular), machines , floor exercises or a gym with machines are also good options   Keep thinking about quitting smoking  It would beneifit your health in many ways   Flu shot today    You have an order for:  []   3D Mammogram  [x]   Bone Density     Please call for appointment:   [x]   [facility]  [address] [place] [address]  [phone]  []   [facility])   [address]  [place], [place] [address]  [phone]  []   [facility]      [address] [place], [place]        [phone]         []   [facility]  [address] [place], [place]  [phone]  []  [facility]   [address]   [place], [place] [address]  [phone]  []  [facility]  [address] [place], [place] [address] [phone]    Make sure to wear two piece clothing  No lotions powders or deodorants the day of the appointment Make sure to bring picture ID and insurance card.  Bring list of medications you are currently taking including any supplements.   Schedule your screening mammogram through MyChart!   Select [place] imaging sites can now be scheduled through MyChart.  Log into your MyChart  account.  Go to 'Visit' (or 'Appointments' if  on mobile App) --> Schedule an  Appointment  Under 'Select a Reason for Visit' choose the Mammogram  Screening option.  Complete the pre-visit questions  and select the time and place that  best fits your schedule

## 2023-11-10 NOTE — Assessment & Plan Note (Signed)
 Disc goals for lipids and reasons to control them Rev last labs with pt Rev low sat fat diet in detail Labs ordered today  Taking crestor  10 mg daily LDL 74

## 2023-11-10 NOTE — Assessment & Plan Note (Signed)
 Reviewed health habits including diet and exercise and skin cancer prevention Reviewed appropriate screening tests for age  Also reviewed health mt list, fam hx and immunization status , as well as social and family history   See HPI Labs reviewed and ordered Health Maintenance  Topic Date Due   COVID-19 Vaccine (3 - Pfizer risk series) 09/21/2024*   DTaP/Tdap/Td vaccine (2 - Tdap) 11/09/2024*   Breast Cancer Screening  11/09/2024*   Zoster (Shingles) Vaccine (1 of 2) 12/06/2024*   Medicare Annual Wellness Visit  11/09/2024   Colon Cancer Screening  07/09/2028   Pneumococcal Vaccine for age over 47  Completed   Flu Shot  Completed   DEXA scan (bone density measurement)  Completed   Hepatitis C Screening  Completed   HPV Vaccine  Aged Out   Meningitis B Vaccine  Aged Out  *Topic was postponed. The date shown is not the original due date.    Flu shot given Considering Td and shingrix at pharmacy  Declines mammogram  Discussed fall prevention, supplements and exercise for bone density  Dexa ordered  PHQ 0

## 2023-11-10 NOTE — Assessment & Plan Note (Signed)
 Lab Results  Component Value Date   VITAMINB12 685 10/13/2023   Continue oral supplementatoin

## 2023-11-10 NOTE — Assessment & Plan Note (Signed)
 3 cig per day max Disc in detail risks of smoking and possible outcomes including copd, vascular/ heart disease, cancer , respiratory and sinus infections as well as osteoporosis  Pt voices understanding  Pt is not ready to quit

## 2023-11-10 NOTE — Patient Instructions (Signed)
 Ms. Fray,  Thank you for taking the time for your Medicare Wellness Visit. I appreciate your continued commitment to your health goals. Please review the care plan we discussed, and feel free to reach out if I can assist you further.  Medicare recommends these wellness visits once per year to help you and your care team stay ahead of potential health issues. These visits are designed to focus on prevention, allowing your provider to concentrate on managing your acute and chronic conditions during your regular appointments.  Please note that Annual Wellness Visits do not include a physical exam. Some assessments may be limited, especially if the visit was conducted virtually. If needed, we may recommend a separate in-person follow-up with your provider.  Ongoing Care Seeing your primary care provider every 3 to 6 months helps us  monitor your health and provide consistent, personalized care.   Referrals If a referral was made during today's visit and you haven't received any updates within two weeks, please contact the referred provider directly to check on the status.  Recommended Screenings:  Health Maintenance  Topic Date Due   DTaP/Tdap/Td vaccine (2 - Tdap) 04/26/2015   Breast Cancer Screening  08/28/2021   Medicare Annual Wellness Visit  09/01/2023   Flu Shot  09/22/2023   COVID-19 Vaccine (3 - Pfizer risk series) 09/21/2024*   Zoster (Shingles) Vaccine (1 of 2) 12/06/2024*   Colon Cancer Screening  07/09/2028   Pneumococcal Vaccine for age over 17  Completed   DEXA scan (bone density measurement)  Completed   Hepatitis C Screening  Completed   HPV Vaccine  Aged Out   Meningitis B Vaccine  Aged Out  *Topic was postponed. The date shown is not the original due date.       09/01/2022    2:16 PM  Advanced Directives  Does Patient Have a Medical Advance Directive? No  Would patient like information on creating a medical advance directive? No - Patient declined   Advance Care  Planning is important because it: Ensures you receive medical care that aligns with your values, goals, and preferences. Provides guidance to your family and loved ones, reducing the emotional burden of decision-making during critical moments.  Vision: Annual vision screenings are recommended for early detection of glaucoma, cataracts, and diabetic retinopathy. These exams can also reveal signs of chronic conditions such as diabetes and high blood pressure.  Dental: Annual dental screenings help detect early signs of oral cancer, gum disease, and other conditions linked to overall health, including heart disease and diabetes.

## 2023-11-10 NOTE — Assessment & Plan Note (Signed)
Lab for lipids Blood pressure is well controlled No symptoms

## 2023-11-10 NOTE — Assessment & Plan Note (Signed)
buspar continues to help her  Plan to continue it  Struggling with slow memory loss  Mood is good today

## 2023-11-10 NOTE — Assessment & Plan Note (Signed)
 Hypothyroidism  Pt has no clinical changes No change in energy level/ hair or skin/ edema and no tremor Lab Results  Component Value Date   TSH 2.78 10/13/2023

## 2023-11-10 NOTE — Telephone Encounter (Signed)
 She is currently not symptomatic unless she has a viral infection

## 2023-11-10 NOTE — Progress Notes (Signed)
 Subjective:   Tammy Neal is a 73 y.o. who presents for a Medicare Wellness preventive visit.  As a reminder, Annual Wellness Visits don't include a physical exam, and some assessments may be limited, especially if this visit is performed virtually. We may recommend an in-person follow-up visit with your provider if needed.  Visit Complete: Virtual I connected with  Tammy Neal on 11/10/23 by a audio enabled telemedicine application and verified that I am speaking with the correct person using two identifiers.  Patient Location: Home  Provider Location: Office/Clinic  I discussed the limitations of evaluation and management by telemedicine. The patient expressed understanding and agreed to proceed.  Vital Signs: Because this visit was a virtual/telehealth visit, some criteria may be missing or patient reported. Any vitals not documented were not able to be obtained and vitals that have been documented are patient reported.  VideoDeclined- This patient declined Librarian, academic. Therefore the visit was completed with audio only.  Persons Participating in Visit: Patient.  AWV Questionnaire: No: Patient Medicare AWV questionnaire was not completed prior to this visit.  Cardiac Risk Factors include: advanced age (>38men, >21 women);dyslipidemia;hypertension;smoking/ tobacco exposure     Objective:    Today's Vitals   11/10/23 1246  Weight: 129 lb (58.5 kg)  Height: 4' 11.5 (1.511 m)   Body mass index is 25.62 kg/m.     11/10/2023    1:05 PM 09/01/2022    2:16 PM 08/27/2021    2:35 PM 08/11/2021    6:00 PM 07/30/2021   10:04 AM 11/16/2016    5:18 PM 11/16/2016    8:14 AM  Advanced Directives  Does Patient Have a Medical Advance Directive? Yes No Yes Yes Yes  Yes   Type of Estate agent of [place];Living will  Healthcare Power of [place];Living will Healthcare Power of [employer] of [place];Living will  Healthcare Power of [employer] Power of Attorney  Does patient want to make changes to medical advance directive?    No - Patient declined  No - Patient declined    Copy of Healthcare Power of Attorney in Chart? No - copy requested  No - copy requested    No - copy requested   Would patient like information on creating a medical advance directive?  No - Patient declined          Data saved with a previous flowsheet row definition    Current Medications (verified) Outpatient Encounter Medications as of 11/10/2023  Medication Sig   acetaminophen  (TYLENOL ) 160 MG/5ML liquid Take 480 mg by mouth every 4 (four) hours as needed for pain.   albuterol  (VENTOLIN  HFA) 108 (90 Base) MCG/ACT inhaler SMARTSIG:1-2 Puff(s) Via Inhaler 4 Times Daily PRN   amLODipine  (NORVASC ) 5 MG tablet Take 1 tablet (5 mg total) by mouth daily.   busPIRone  (BUSPAR ) 15 MG tablet TAKE ONE QUARTER (1/4) TABLET BY MOUTH TWICE A DAY   calcium  carbonate (TITRALAC) 420 MG CHEW chewable tablet Chew 420 mg by mouth.   Cholecalciferol (VITAMIN D3) 10 MCG (400 UNIT) CHEW Chew by mouth.   Cyanocobalamin  (VITAMIN B-12 PO) Take 3,000 mcg by mouth in the morning. Gummies   famotidine  (PEPCID ) 20 MG tablet TAKE ONE TABLET (20 MG TOTAL) BY MOUTH DAILY.   levothyroxine  (SYNTHROID ) 50 MCG tablet Take 1 tablet (50 mcg total) by mouth daily before breakfast.   memantine  (NAMENDA ) 10 MG tablet Take 1 tablet (10 mg total) by mouth 2 (two) times  daily.   menthol -zinc oxide (GOLD BOND) powder Apply topically.   mupirocin  ointment (BACTROBAN ) 2 % Apply 1 Application topically 2 (two) times daily.   nystatin  cream (MYCOSTATIN ) Apply 1 Application topically 2 (two) times daily as needed for dry skin (yeast rash).   rosuvastatin  (CRESTOR ) 10 MG tablet TAKE 1 TABLET BY MOUTH ONCE DAILY   Salicylic Acid (CERAVE PSORIASIS) 2 % CREA Apply topically.   No facility-administered encounter medications on file as of 11/10/2023.    Allergies  (verified) Voltaren  [diclofenac  sodium], Aricept  [donepezil  hcl], Aspirin, Lipitor [atorvastatin], and Zocor [simvastatin]   History: Past Medical History:  Diagnosis Date   Allergy    allergic rhinitis   Anxiety    Arthritis    COPD (chronic obstructive pulmonary disease) (HCC)    GERD (gastroesophageal reflux disease)    Hyperlipidemia    no meds   Hypertension    Hypothyroidism    Neck pain, chronic    with fusion   Osteoporosis    Psoriasis    Past Surgical History:  Procedure Laterality Date   ABDOMINAL HYSTERECTOMY     endometriosis 1 ovary left   APPENDECTOMY     NECK SURGERY     SPINE SURGERY  1977   cervical fusion Dr Gaither x2   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   Procedure: LEFT TOTAL HIP ARTHROPLASTY ANTERIOR APPROACH;  Surgeon: Melodi Lerner, MD;  Location: [facility];  Service: Orthopedics;  Laterality: Left;   TOTAL HIP ARTHROPLASTY Right 08/11/2021   Procedure: TOTAL HIP ARTHROPLASTY ANTERIOR APPROACH;  Surgeon: Melodi Lerner, MD;  Location: [facility];  Service: Orthopedics;  Laterality: Right;   Family History  Problem Relation Age of Onset   Hypertension Mother    Diabetes Mother    Stroke Mother 58   Asthma Father    COPD Father    Colon cancer Neg Hx    Esophageal cancer Neg Hx    Ulcerative colitis Neg Hx    Stomach cancer Neg Hx    Colon polyps Neg Hx    Social History   Socioeconomic History   Marital status: Divorced    Spouse name: Not on file   Number of children: Not on file   Years of education: Not on file   Highest education level: Not on file  Occupational History   Not on file  Tobacco Use   Smoking status: Some Days    Types: Cigarettes   Smokeless tobacco: Never  Vaping Use   Vaping status: Never Used  Substance and Sexual Activity   Alcohol use: No   Drug use: No   Sexual activity: Not Currently    Birth control/protection: Post-menopausal  Other Topics Concern   Not on file  Social History Narrative   Not on file    Social Drivers of Health   Financial Resource Strain: Low Risk  (11/10/2023)   Overall Financial Resource Strain (CARDIA)    Difficulty of Paying Living Expenses: Not hard at all  Food Insecurity: No Food Insecurity (11/10/2023)   Hunger Vital Sign    Worried About Running Out of Food in the Last Year: Never true    Ran Out of Food in the Last Year: Never true  Transportation Needs: No Transportation Needs (11/10/2023)   PRAPARE - Administrator, Civil Service (Medical): No    Lack of Transportation (Non-Medical): No  Physical Activity: Sufficiently Active (11/10/2023)   Exercise Vital Sign    Days of Exercise per Week: 7 days  Minutes of Exercise per Session: 30 min  Stress: No Stress Concern Present (11/10/2023)   [employer] of Occupational Health - Occupational Stress Questionnaire    Feeling of Stress: Only a little  Social Connections: Moderately Integrated (11/10/2023)   Social Connection and Isolation Panel    Frequency of Communication with Friends and Family: More than three times a week    Frequency of Social Gatherings with Friends and Family: More than three times a week    Attends Religious Services: More than 4 times per year    Active Member of [employer] or Organizations: Yes    Attends Engineer, structural: More than 4 times per year    Marital Status: Never married    Tobacco Counseling Ready to quit: Not Answered Counseling given: Not Answered    Clinical Intake:  Pre-visit preparation completed: Yes  Pain : No/denies pain     BMI - recorded: 25.62 Nutritional Status: BMI 25 -29 Overweight Nutritional Risks: None Diabetes: No  No results found for: HGBA1C   How often do you need to have someone help you when you read instructions, pamphlets, or other written materials from your doctor or pharmacy?: 1 - Never  Interpreter Needed?: No  Comments: daughter lives with pt Information entered by ::  B.Lucan Riner,LPN   Activities of Daily Living     11/10/2023    1:05 PM  In your present state of health, do you have any difficulty performing the following activities:  Hearing? 0  Vision? 0  Difficulty concentrating or making decisions? 1  Walking or climbing stairs? 0  Dressing or bathing? 0  Doing errands, shopping? 1  Comment does not drive  Preparing Food and eating ? N  Using the Toilet? N  In the past six months, have you accidently leaked urine? N  Do you have problems with loss of bowel control? N  Managing your Medications? Y  Managing your Finances? Y  Housekeeping or managing your Housekeeping? N    Patient Care Team: [place], Laine LABOR, MD as PCP - General Folt[place]ki, Morna SAILOR, [facility] (Inactive) as Pharmacist (Pharmacist)  I have updated your Care Teams any recent Medical Services you may have received from other providers in the past year.     Assessment:   This is a routine wellness examination for Tammy Neal.  Hearing/Vision screen Hearing Screening - Comments:: Patient denies any hearing difficulties.   Vision Screening - Comments:: Pt says their vision is good with glasses/readers    Goals Addressed             This Visit's Progress    COMPLETED: Increase water  intake       Starting 01/13/2016, I will attempt to drink at least 8 oz of water  with each meal daily.      COMPLETED: Patient Stated       08/27/2021, no goals     Patient Stated   On track    11/10/23-Patients goal is to remain as active as she can.      Patient Stated       Getting her daughter back home       Depression Screen     11/10/2023    1:01 PM 09/01/2022    2:25 PM 08/27/2021    2:36 PM 12/29/2020    3:15 PM 04/16/2019   10:59 AM 04/16/2019   10:45 AM 01/13/2016   10:52 AM  PHQ 2/9 Scores  PHQ - 2 Score 0 0 0 0 0 0 0  PHQ- 9 Score     0 0     Fall Risk     11/10/2023   12:59 PM 09/01/2022    2:24 PM 08/27/2021    2:35 PM 07/05/2021   11:39 AM 06/22/2020    3:35 PM  Fall Risk    Falls in the past year? 0 0 0 0 0  Number falls in past yr: 0 0 0    Injury with Fall? 0 0 0    Risk for fall due to : No Fall Risks No Fall Risks Impaired balance/gait;Impaired mobility;Medication side effect    Follow up Education provided;Falls prevention discussed Falls prevention discussed Falls evaluation completed;Education provided;Falls prevention discussed  Falls evaluation completed       Data saved with a previous flowsheet row definition    MEDICARE RISK AT HOME:  Medicare Risk at Home Any stairs in or around the home?: Yes If so, are there any without handrails?: Yes Home free of loose throw rugs in walkways, pet beds, electrical cords, etc?: Yes Adequate lighting in your home to reduce risk of falls?: Yes Life alert?: No Use of a cane, walker or w/c?: No Grab bars in the bathroom?: Yes Shower chair or bench in shower?: Yes Elevated toilet seat or a handicapped toilet?: No  TIMED UP AND GO:  Was the test performed?  No  Cognitive Function: 6CIT completed    01/13/2016   11:10 AM  MMSE - Mini Mental State Exam  Orientation to time 3   Orientation to time comments pt was unable to state the current year   Orientation to Place 5   Registration 3   Attention/ Calculation 0   Recall 0   Recall-comments pt was unable to recall 3 of 3 words   Language- name 2 objects 0   Language- repeat 1  Language- follow 3 step command 3   Language- read & follow direction 0   Write a sentence 0   Copy design 0   Total score 15      Data saved with a previous flowsheet row definition        11/10/2023    1:08 PM 09/01/2022    2:22 PM  6CIT Screen  What Year? 0 points 4 points  What month? 0 points 3 points  What time? 0 points 0 points  Count back from 20 4 points 0 points  Months in reverse 4 points 4 points  Repeat phrase 10 points 10 points  Total Score 18 points 21 points    Immunizations Immunization History  Administered Date(s) Administered   Fluad  Quad(high Dose 65+) 05/05/2021   Influenza Split 11/02/2011   Influenza Whole 02/21/1997, 11/22/2006, 11/22/2007   Influenza,inj,Quad PF,6+ Mos 11/25/2014, 10/30/2015, 11/04/2016, 01/04/2018   PFIZER(Purple Top)SARS-COV-2 Vaccination 04/21/2019, 05/14/2019   PNEUMOCOCCAL CONJUGATE-20 09/06/2022   Pneumococcal Conjugate-13 10/30/2015   Pneumococcal Polysaccharide-23 10/30/2002, 05/03/2010   Td 04/25/2005   Zoster, Live 07/06/2010    Screening Tests Health Maintenance  Topic Date Due   DTaP/Tdap/Td (2 - Tdap) 04/26/2015   Mammogram  08/28/2021   Influenza Vaccine  09/22/2023   COVID-19 Vaccine (3 - Pfizer risk series) 09/21/2024 (Originally 06/11/2019)   Zoster Vaccines- Shingrix (1 of 2) 12/06/2024 (Originally 07/21/1969)   Medicare Annual Wellness (AWV)  11/09/2024   Colonoscopy  07/09/2028   Pneumococcal Vaccine: 50+ Years  Completed   DEXA SCAN  Completed   Hepatitis C Screening  Completed   HPV VACCINES  Aged Out  Meningococcal B Vaccine  Aged Out    Health Maintenance Items Addressed: None due at this time. Pt will receive vaccines at their pharmcy when decided to obtain   Additional Screening:  Vision Screening: Recommended annual ophthalmology exams for early detection of glaucoma and other disorders of the eye. Is the patient up to date with their annual eye exam?  No  Who is the provider or what is the name of the office in which the patient attends annual eye exams? None-declines referral  Dental Screening: Recommended annual dental exams for proper oral hygiene  Community Resource Referral / Chronic Care Management: CRR required this visit?  No   CCM required this visit?  No   Plan:    I have personally reviewed and noted the following in the patient's chart:   Medical and social history Use of alcohol, tobacco or illicit drugs  Current medications and supplements including opioid prescriptions. Patient is not currently taking opioid  prescriptions. Functional ability and status Nutritional status Physical activity Advanced directives List of other physicians Hospitalizations, surgeries, and ER visits in previous 12 months Vitals Screenings to include cognitive, depression, and falls Referrals and appointments  In addition, I have reviewed and discussed with patient certain preventive protocols, quality metrics, and best practice recommendations. A written personalized care plan for preventive services as well as general preventive health recommendations were provided to patient.   Tammy LITTIE Saris, LPN   [id number]/[id number]   After Visit Summary: (MyChart) Due to this being a telephonic visit, the after visit summary with patients personalized plan was offered to patient via MyChart   Notes: Nothing significant to report at this time.

## 2023-11-10 NOTE — Telephone Encounter (Signed)
 Copied from CRM [id number]. Topic: Clinical - Medication Question >> Nov 10, 2023 10:36 AM [place] A wrote: Reason for CRM: Ami with [facility] called asking due to patient's COPD could she be put on Spriva- please contact [phone]

## 2023-11-10 NOTE — Assessment & Plan Note (Signed)
 bp in fair control at this time  BP Readings from Last 1 Encounters:  11/10/23 130/64   No changes needed Most recent labs reviewed  Disc lifstyle change with low sodium diet and exercise  Continues amlodipine  5 mg daily

## 2023-11-10 NOTE — Assessment & Plan Note (Signed)
 Continues namenda  10 mg bid  Per family not much - worse when fatigued or overwhelmed  Works hard to keep a routine and familiar surroundings

## 2023-11-27 DIAGNOSIS — M542 Cervicalgia: Secondary | ICD-10-CM | POA: Diagnosis not present

## 2023-11-27 DIAGNOSIS — M47892 Other spondylosis, cervical region: Secondary | ICD-10-CM | POA: Diagnosis not present

## 2023-11-27 DIAGNOSIS — M9901 Segmental and somatic dysfunction of cervical region: Secondary | ICD-10-CM | POA: Diagnosis not present

## 2023-11-30 DIAGNOSIS — M9901 Segmental and somatic dysfunction of cervical region: Secondary | ICD-10-CM | POA: Diagnosis not present

## 2023-11-30 DIAGNOSIS — M47892 Other spondylosis, cervical region: Secondary | ICD-10-CM | POA: Diagnosis not present

## 2023-11-30 DIAGNOSIS — M542 Cervicalgia: Secondary | ICD-10-CM | POA: Diagnosis not present

## 2023-12-04 ENCOUNTER — Ambulatory Visit: Payer: Self-pay

## 2023-12-04 NOTE — Telephone Encounter (Signed)
 FYI Only or Action Required?: FYI only for provider.  Patient was last seen in primary care on 11/10/2023 by Randeen Laine LABOR, MD.  Called Nurse Triage reporting Back Pain.  Symptoms began a week ago.  Interventions attempted: OTC medications: Tylenol  and Ice/heat application; 2 visits to chiropractor.  Symptoms are: moderate right lower back pain radiates to right hip/front of right leg; productive cough with clear mucus gradually worsening.  Triage Disposition: See PCP When Office is Open (Within 3 Days)  Patient/caregiver understands and will follow disposition?: Yes           Copied from CRM [id number]. Topic: Clinical - Red Word Triage >> Dec 04, 2023  8:31 AM Gustabo D wrote: Pt hurt her back and it hasn't gotten better she's in a lot of pain. Pt has dementia and doesn't understand what she did or why she is in pain. Reason for Disposition  [1] MODERATE back pain (e.g., interferes with normal activities) AND [2] present > 3 days  Answer Assessment - Initial Assessment Questions 1. ONSET: When did the pain begin? (e.g., minutes, hours, days)     A week ago.  2. LOCATION: Where does it hurt? (upper, mid or lower back)     Lower, right side.  3. SEVERITY: How bad is the pain?  (e.g., Scale 1-10; mild, moderate, or severe)     Hard to assess due to patient has dementia. She states it is hard for her accurately describe the severity. Patient states 5/10 when asked by family.  4. PATTERN: Is the pain constant? (e.g., yes, no; constant, intermittent)      Constant.  5. RADIATION: Does the pain shoot into your legs or somewhere else?     Sometimes patient holds her hand like it is radiating down her hip, she tells her daughter sometimes it is going down the front of her hip/leg.  6. CAUSE:  What do you think is causing the back pain?      Daughter states the patient has a really bad cough and she is unsure if that caused it. The patient has also been helping her  daughter moving, denies her lifting any heavy boxes. Patient has been going to a chiropractor (2 appointments).  7. BACK OVERUSE:  Any recent lifting of heavy objects, strenuous work or exercise?     Helping her daughter with packing for her move.  8. MEDICINES: What have you taken so far for the pain? (e.g., nothing, acetaminophen , NSAIDS)     Ice/heat, Tylenol .  9. NEUROLOGIC SYMPTOMS: Do you have any weakness, numbness, or problems with bowel/bladder control?     No.  10. OTHER SYMPTOMS: Do you have any other symptoms? (e.g., fever, abdomen pain, burning with urination, blood in urine)       Cold several months ago. Productive cough with clear mucus (daughter states she thinks this is from her COPD).  11. PREGNANCY: Is there any chance you are pregnant? When was your last menstrual period?       N/A.  Protocols used: Back Pain-A-AH

## 2023-12-04 NOTE — Telephone Encounter (Signed)
 Will see patient then Agree with ER and UC precautions

## 2023-12-05 ENCOUNTER — Ambulatory Visit (INDEPENDENT_AMBULATORY_CARE_PROVIDER_SITE_OTHER): Admitting: Family Medicine

## 2023-12-05 ENCOUNTER — Ambulatory Visit: Payer: Self-pay | Admitting: Family Medicine

## 2023-12-05 ENCOUNTER — Ambulatory Visit (INDEPENDENT_AMBULATORY_CARE_PROVIDER_SITE_OTHER)
Admission: RE | Admit: 2023-12-05 | Discharge: 2023-12-05 | Disposition: A | Source: Ambulatory Visit | Attending: Family Medicine | Admitting: Family Medicine

## 2023-12-05 ENCOUNTER — Encounter: Payer: Self-pay | Admitting: Family Medicine

## 2023-12-05 VITALS — BP 124/62 | HR 64 | Temp 97.9°F | Ht 59.0 in | Wt 131.2 lb

## 2023-12-05 DIAGNOSIS — M545 Low back pain, unspecified: Secondary | ICD-10-CM | POA: Insufficient documentation

## 2023-12-05 DIAGNOSIS — M5441 Lumbago with sciatica, right side: Secondary | ICD-10-CM | POA: Diagnosis not present

## 2023-12-05 DIAGNOSIS — R059 Cough, unspecified: Secondary | ICD-10-CM | POA: Diagnosis not present

## 2023-12-05 DIAGNOSIS — J449 Chronic obstructive pulmonary disease, unspecified: Secondary | ICD-10-CM | POA: Diagnosis not present

## 2023-12-05 DIAGNOSIS — R053 Chronic cough: Secondary | ICD-10-CM

## 2023-12-05 DIAGNOSIS — F17200 Nicotine dependence, unspecified, uncomplicated: Secondary | ICD-10-CM | POA: Diagnosis not present

## 2023-12-05 DIAGNOSIS — Z981 Arthrodesis status: Secondary | ICD-10-CM | POA: Diagnosis not present

## 2023-12-05 MED ORDER — PREDNISONE 20 MG PO TABS: 20.0000 mg | ORAL_TABLET | Freq: Every day | ORAL | 0 refills | Status: AC

## 2023-12-05 NOTE — Assessment & Plan Note (Addendum)
 Cough s/p uri about a month ago  A little more than her usual smoker's cough  Clear phlegm Hears wheezing at home Long time smoker (light smoker now) Reassuring exam   Cxr ordered-no acute lung changes  Low dose prednisone  5 d  Albuterol  mdi (will ask for spacer at pharmacy) Encouraged smoking cessation  Consider pulm consult if not improved   Update if not starting to improve in a week or if worsening  Call back and Er precautions noted in detail today    Low threshold for pulm referral if needed

## 2023-12-05 NOTE — Patient Instructions (Addendum)
 Continue heat on back for 10 minutes at a time  It helps relax muscles   I think the back pain will continue to get better (if not , let me know)  Take a look at the stretches in the hand out   Chest xray today  We will reach out with results   Ask pharmacy about a spacer for inhaler  Use as needed   Consider quitting smoking   Take prednisone  as directed for cough and wheezing

## 2023-12-05 NOTE — Progress Notes (Signed)
 Subjective:    Patient ID: Tammy Neal, female    DOB: May 04, 1950, 73 y.o.   MRN: [id number]  HPI  Wt Readings from Last 3 Encounters:  12/05/23 131 lb 4 oz (59.5 kg)  11/10/23 130 lb 4 oz (59.1 kg)  11/10/23 129 lb (58.5 kg)   26.51 kg/m  Vitals:   12/05/23 1230  BP: 124/62  Pulse: 64  Temp: 97.9 F (36.6 C)  SpO2: 98%    Pt presents with c/o  Ongoing cough - in setting of copd /smoking  Back and left hip pain   Cough -lingering since uri a month ago  Phlegm-increased but clear ? Smoker's cough  Hears a little wheezing  Not shortness of breath (family notes perhaps a little when getting in bed at end of the day) - exertional   No fever No cold symptoms    Smokes 1-5 cigarettes per day     Given prednisone  in the spring    Got up 2 wk ago with back pain  Feels like a catch that grabs her at certain times  Some stiffness after inactivity- then pain became constant  No burning/tingling or sharp pain  Both sides but worse on the right  Can radiate around to the right hip   Saw chiropractor  Had 2 adjustments and some ice and heat  Bio freeze Tylenol   Massage    Is improved but not gone    Has had laser treatment on neck from chiropractor for neck in the past and it helped   DG Chest 2 View Result Date: 12/05/2023 EXAM: 2 VIEW(S) XRAY OF THE CHEST 12/05/2023 01:15:18 PM COMPARISON: 02/21/2023 CLINICAL HISTORY: ongoing cough / clear phlegm in smoker with h/o copd not getting better after uri a month ago. FINDINGS: LUNGS AND PLEURA: No focal pulmonary opacity. No pulmonary edema. No pleural effusion. No pneumothorax. HEART AND MEDIASTINUM: Calcified aorta. No acute abnormality of the cardiac and mediastinal silhouettes. BONES AND SOFT TISSUES: Cervical spinal fusion hardware noted. Thoracic degenerative changes. Thoracolumbar scoliosis. IMPRESSION: 1. No acute cardiopulmonary process. Electronically signed by: Norman Gatlin MD 12/05/2023 01:43 PM EDT  RP Workstation: [id number]     Patient Active Problem List   Diagnosis Date Noted   Low back pain 12/05/2023   History of colon polyps 09/06/2022   Osteoarthritis of right hip 08/11/2021   Pill dysphagia 07/05/2021   Right hip pain 12/29/2020   Hypothyroid 06/23/2020   Essential hypertension 04/29/2020   Spondylosis without myelopathy or radiculopathy, cervical region 02/28/2020   Low vitamin B12 level 03/20/2018   Anxiety disorder 03/20/2018   OA (osteoarthritis) of hip 11/16/2016   Aortic atherosclerosis 11/06/2016   Arthritis of left hip 07/13/2016   Estrogen deficiency 12/29/2015   Cough 04/30/2015   Mild cognitive impairment 01/02/2015   Intertrigo 11/02/2011   Routine general medical examination at a health care facility 10/26/2011   Encounter for gynecological examination 07/05/2010   Neck pain 06/10/2008   Hyperlipidemia 02/26/2007   Smoker 02/26/2007   Psoriasis 02/26/2007   Past Medical History:  Diagnosis Date   Allergy    allergic rhinitis   Anxiety    Arthritis    COPD (chronic obstructive pulmonary disease) (HCC)    GERD (gastroesophageal reflux disease)    Hyperlipidemia    no meds   Hypertension    Hypothyroidism    Neck pain, chronic    with fusion   Osteoporosis    Psoriasis    Past Surgical History:  Procedure  Laterality Date   ABDOMINAL HYSTERECTOMY     endometriosis 1 ovary left   APPENDECTOMY     NECK SURGERY     SPINE SURGERY  1977   cervical fusion Dr Gaither x2   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   Procedure: LEFT TOTAL HIP ARTHROPLASTY ANTERIOR APPROACH;  Surgeon: Melodi Lerner, MD;  Location: [facility];  Service: Orthopedics;  Laterality: Left;   TOTAL HIP ARTHROPLASTY Right 08/11/2021   Procedure: TOTAL HIP ARTHROPLASTY ANTERIOR APPROACH;  Surgeon: Melodi Lerner, MD;  Location: [facility];  Service: Orthopedics;  Laterality: Right;   Social History   Tobacco Use   Smoking status: Some Days    Types: Cigarettes   Smokeless tobacco:  Never  Vaping Use   Vaping status: Never Used  Substance Use Topics   Alcohol use: No   Drug use: No   Family History  Problem Relation Age of Onset   Hypertension Mother    Diabetes Mother    Stroke Mother 68   Asthma Father    COPD Father    Colon cancer Neg Hx    Esophageal cancer Neg Hx    Ulcerative colitis Neg Hx    Stomach cancer Neg Hx    Colon polyps Neg Hx    Allergies  Allergen Reactions   Voltaren  [Diclofenac  Sodium] Anaphylaxis, Hives and Itching   Aricept  [Donepezil  Hcl] Itching    Itchy, jittery (no hives)   Aspirin Other (See Comments)    REACTION: GI upset   Lipitor [Atorvastatin] Other (See Comments)    REACTION: pain   Zocor [Simvastatin] Other (See Comments)    REACTION: muscle pain   Current Outpatient Medications on File Prior to Visit  Medication Sig Dispense Refill   acetaminophen  (TYLENOL ) 160 MG/5ML liquid Take 480 mg by mouth every 4 (four) hours as needed for pain.     albuterol  (VENTOLIN  HFA) 108 (90 Base) MCG/ACT inhaler Inhale 2 puffs into the lungs every 4 (four) hours as needed for wheezing or shortness of breath. 1 each 5   amLODipine  (NORVASC ) 5 MG tablet Take 1 tablet (5 mg total) by mouth daily. 90 tablet 3   busPIRone  (BUSPAR ) 15 MG tablet TAKE ONE QUARTER (1/4) TABLET BY MOUTH TWICE A DAY 45 tablet 3   calcium  carbonate (TITRALAC) 420 MG CHEW chewable tablet Chew 420 mg by mouth.     Cholecalciferol (VITAMIN D3) 10 MCG (400 UNIT) CHEW Chew by mouth.     Cyanocobalamin  (VITAMIN B-12 PO) Take 3,000 mcg by mouth in the morning. Gummies     famotidine  (PEPCID ) 20 MG tablet TAKE ONE TABLET (20 MG TOTAL) BY MOUTH DAILY. 90 tablet 3   levothyroxine  (SYNTHROID ) 50 MCG tablet Take 1 tablet (50 mcg total) by mouth daily before breakfast. 90 tablet 3   memantine  (NAMENDA ) 10 MG tablet Take 1 tablet (10 mg total) by mouth 2 (two) times daily. 180 tablet 3   menthol -zinc oxide (GOLD BOND) powder Apply topically.     mupirocin  ointment (BACTROBAN )  2 % Apply 1 Application topically 2 (two) times daily. 22 g 0   nystatin  cream (MYCOSTATIN ) Apply 1 Application topically 2 (two) times daily as needed for dry skin (yeast rash). 30 g 3   rosuvastatin  (CRESTOR ) 10 MG tablet TAKE 1 TABLET BY MOUTH ONCE DAILY 90 tablet 3   Salicylic Acid (CERAVE PSORIASIS) 2 % CREA Apply topically.     No current facility-administered medications on file prior to visit.    Review of Systems  Constitutional:  Negative for chills, fatigue and fever.  HENT:  Negative for postnasal drip, rhinorrhea and trouble swallowing.   Respiratory:  Positive for cough and wheezing. Negative for shortness of breath.   Cardiovascular:  Negative for chest pain.  Musculoskeletal:  Positive for back pain.  Neurological:  Negative for weakness and numbness.       Objective:   Physical Exam Constitutional:      General: She is not in acute distress.    Appearance: Normal appearance. She is well-developed and normal weight. She is not ill-appearing or diaphoretic.  HENT:     Head: Normocephalic and atraumatic.     Mouth/Throat:     Mouth: Mucous membranes are moist.     Pharynx: Oropharynx is clear.  Eyes:     Conjunctiva/sclera: Conjunctivae normal.     Pupils: Pupils are equal, round, and reactive to light.  Neck:     Thyroid : No thyromegaly.     Vascular: No carotid bruit or JVD.  Cardiovascular:     Rate and Rhythm: Normal rate and regular rhythm.     Heart sounds: Normal heart sounds.     No gallop.  Pulmonary:     Effort: Pulmonary effort is normal. No respiratory distress.     Breath sounds: No stridor. Wheezing present. No rhonchi or rales.     Comments: Wheezing on forced exp only in lower lung fields bilateral  Abdominal:     General: There is no distension or abdominal bruit.     Palpations: Abdomen is soft.  Musculoskeletal:     Cervical back: Normal range of motion and neck supple.     Lumbar back: Spasms, tenderness and bony tenderness present. No  swelling or deformity. Normal range of motion. No scoliosis.     Right lower leg: No edema.     Left lower leg: No edema.     Comments: Normal rom  Most discomfort with ext (20 deg) and left lateral bend   Tender over Lower LS (mild) and in right SI area  No trochanteric tenderness Normal rom hips   Slr causes back (not leg) pain on right   Lymphadenopathy:     Cervical: No cervical adenopathy.  Skin:    General: Skin is warm and dry.     Coloration: Skin is not pale.     Findings: No rash.  Neurological:     Mental Status: She is alert.     Cranial Nerves: No cranial nerve deficit.     Motor: No weakness.     Coordination: Coordination normal.     Deep Tendon Reflexes: Reflexes are normal and symmetric. Reflexes normal.  Psychiatric:        Mood and Affect: Mood normal.           Assessment & Plan:   Problem List Items Addressed This Visit       Other   Smoker   1-5 cig per day  In setting of memory issues-unable to quit  Given info on lung cancer screening program Will call if interested in referral       Low back pain   2 weeks  Started as a catch on right side/then more constant dull pain  Some radiation to hip area/lateral No neuro c/o otherwise  Reassuring exam Is getting better with time and symptom care  Chiropractor helped also   Suspect this was muscle spasm -gradually getting better  Recommend heat/walking  Given back strain rehab handout   Update  if not starting to improve in a week or if worsening  Call back and Er precautions noted in detail today        Relevant Medications   predniSONE  (DELTASONE ) 20 MG tablet   Cough - Primary   Cough s/p uri about a month ago  A little more than her usual smoker's cough  Clear phlegm Hears wheezing at home Long time smoker (light smoker now) Reassuring exam   Cxr ordered-no acute lung changes  Low dose prednisone  5 d  Albuterol  mdi (will ask for spacer at pharmacy) Encouraged smoking  cessation  Consider pulm consult if not improved   Update if not starting to improve in a week or if worsening  Call back and Er precautions noted in detail today    Low threshold for pulm referral if needed       Relevant Orders   DG Chest 2 View (Completed)

## 2023-12-05 NOTE — Assessment & Plan Note (Signed)
 2 weeks  Started as a catch on right side/then more constant dull pain  Some radiation to hip area/lateral No neuro c/o otherwise  Reassuring exam Is getting better with time and symptom care  Chiropractor helped also   Suspect this was muscle spasm -gradually getting better  Recommend heat/walking  Given back strain rehab handout   Update if not starting to improve in a week or if worsening  Call back and Er precautions noted in detail today

## 2023-12-05 NOTE — Assessment & Plan Note (Signed)
 1-5 cig per day  In setting of memory issues-unable to quit  Given info on lung cancer screening program Will call if interested in referral

## 2023-12-19 ENCOUNTER — Encounter: Payer: Self-pay | Admitting: Family Medicine

## 2023-12-19 DIAGNOSIS — M5441 Lumbago with sciatica, right side: Secondary | ICD-10-CM

## 2023-12-19 NOTE — Telephone Encounter (Signed)
 Please call pt's family to arrange xray It is ordered  Want to make sure there are limited days/times this week in terms of staffing/ etc Thanks

## 2023-12-22 ENCOUNTER — Ambulatory Visit (INDEPENDENT_AMBULATORY_CARE_PROVIDER_SITE_OTHER)
Admission: RE | Admit: 2023-12-22 | Discharge: 2023-12-22 | Disposition: A | Discharge status: Home / Self Care | Source: Ambulatory Visit | Attending: Family Medicine | Admitting: Family Medicine

## 2023-12-22 ENCOUNTER — Encounter: Payer: Self-pay | Admitting: Family Medicine

## 2023-12-22 ENCOUNTER — Ambulatory Visit: Payer: Self-pay | Admitting: Family Medicine

## 2023-12-22 DIAGNOSIS — M47816 Spondylosis without myelopathy or radiculopathy, lumbar region: Secondary | ICD-10-CM | POA: Diagnosis not present

## 2023-12-22 DIAGNOSIS — M48061 Spinal stenosis, lumbar region without neurogenic claudication: Secondary | ICD-10-CM | POA: Diagnosis not present

## 2023-12-22 DIAGNOSIS — M5441 Lumbago with sciatica, right side: Secondary | ICD-10-CM

## 2023-12-24 NOTE — Telephone Encounter (Signed)
 I put the referral in for PT Please let us  know if you don't hear in 1-2 weeks to set that up (mychart message or call or letter)

## 2023-12-27 DIAGNOSIS — I700 Atherosclerosis of aorta: Secondary | ICD-10-CM | POA: Diagnosis not present

## 2023-12-27 DIAGNOSIS — Z823 Family history of stroke: Secondary | ICD-10-CM | POA: Diagnosis not present

## 2023-12-27 DIAGNOSIS — K219 Gastro-esophageal reflux disease without esophagitis: Secondary | ICD-10-CM | POA: Diagnosis not present

## 2023-12-27 DIAGNOSIS — G309 Alzheimer's disease, unspecified: Secondary | ICD-10-CM | POA: Diagnosis not present

## 2023-12-27 DIAGNOSIS — J449 Chronic obstructive pulmonary disease, unspecified: Secondary | ICD-10-CM | POA: Diagnosis not present

## 2023-12-27 DIAGNOSIS — F0284 Dementia in other diseases classified elsewhere, unspecified severity, with anxiety: Secondary | ICD-10-CM | POA: Diagnosis not present

## 2023-12-27 DIAGNOSIS — L409 Psoriasis, unspecified: Secondary | ICD-10-CM | POA: Diagnosis not present

## 2023-12-27 DIAGNOSIS — E785 Hyperlipidemia, unspecified: Secondary | ICD-10-CM | POA: Diagnosis not present

## 2023-12-27 DIAGNOSIS — Z818 Family history of other mental and behavioral disorders: Secondary | ICD-10-CM | POA: Diagnosis not present

## 2023-12-27 DIAGNOSIS — F411 Generalized anxiety disorder: Secondary | ICD-10-CM | POA: Diagnosis not present

## 2023-12-27 DIAGNOSIS — F17210 Nicotine dependence, cigarettes, uncomplicated: Secondary | ICD-10-CM | POA: Diagnosis not present

## 2023-12-27 DIAGNOSIS — E039 Hypothyroidism, unspecified: Secondary | ICD-10-CM | POA: Diagnosis not present

## 2023-12-27 DIAGNOSIS — Z96649 Presence of unspecified artificial hip joint: Secondary | ICD-10-CM | POA: Diagnosis not present

## 2023-12-27 DIAGNOSIS — F0283 Dementia in other diseases classified elsewhere, unspecified severity, with mood disturbance: Secondary | ICD-10-CM | POA: Diagnosis not present

## 2023-12-27 DIAGNOSIS — Z79890 Hormone replacement therapy (postmenopausal): Secondary | ICD-10-CM | POA: Diagnosis not present

## 2023-12-27 DIAGNOSIS — I10 Essential (primary) hypertension: Secondary | ICD-10-CM | POA: Diagnosis not present

## 2023-12-27 DIAGNOSIS — M810 Age-related osteoporosis without current pathological fracture: Secondary | ICD-10-CM | POA: Diagnosis not present

## 2023-12-27 DIAGNOSIS — M1990 Unspecified osteoarthritis, unspecified site: Secondary | ICD-10-CM | POA: Diagnosis not present

## 2024-01-08 NOTE — Therapy (Signed)
 OUTPATIENT PHYSICAL THERAPY THORACOLUMBAR EVALUATION   Patient Name: Tammy Neal MRN: [id number] DOB:1950-12-21, 73 y.o., female Today's Date: 01/10/2024  END OF SESSION:  PT End of Session - 01/09/24 1155     Visit Number 1    Number of Visits 21   1-2x per week   Date for Recertification  03/29/24    Authorization Type [employer]IC[employer]    Authorization - Visit Number 1    PT Start Time 1150    PT Stop Time 1235    PT Time Calculation (min) 45 min    Activity Tolerance Patient tolerated treatment well    Behavior During Therapy WFL for tasks assessed/performed          Past Medical History:  Diagnosis Date   Allergy    allergic rhinitis   Anxiety    Arthritis    COPD (chronic obstructive pulmonary disease) (HCC)    GERD (gastroesophageal reflux disease)    Hyperlipidemia    no meds   Hypertension    Hypothyroidism    Neck pain, chronic    with fusion   Osteoporosis    Psoriasis    Past Surgical History:  Procedure Laterality Date   ABDOMINAL HYSTERECTOMY     endometriosis 1 ovary left   APPENDECTOMY     NECK SURGERY     SPINE SURGERY  1977   cervical fusion Dr Gaither x2   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   Procedure: LEFT TOTAL HIP ARTHROPLASTY ANTERIOR APPROACH;  Surgeon: Melodi Lerner, MD;  Location: [facility];  Service: Orthopedics;  Laterality: Left;   TOTAL HIP ARTHROPLASTY Right 08/11/2021   Procedure: TOTAL HIP ARTHROPLASTY ANTERIOR APPROACH;  Surgeon: Melodi Lerner, MD;  Location: [facility];  Service: Orthopedics;  Laterality: Right;   Patient Active Problem List   Diagnosis Date Noted   Low back pain 12/05/2023   History of colon polyps 09/06/2022   Osteoarthritis of right hip 08/11/2021   Pill dysphagia 07/05/2021   Right hip pain 12/29/2020   Hypothyroid 06/23/2020   Essential hypertension 04/29/2020   Spondylosis without myelopathy or radiculopathy, cervical region 02/28/2020   Low vitamin B12 level 03/20/2018   Anxiety disorder  03/20/2018   OA (osteoarthritis) of hip 11/16/2016   Aortic atherosclerosis 11/06/2016   Arthritis of left hip 07/13/2016   Estrogen deficiency 12/29/2015   Cough 04/30/2015   Mild cognitive impairment 01/02/2015   Intertrigo 11/02/2011   Routine general medical examination at a health care facility 10/26/2011   Encounter for gynecological examination 07/05/2010   Neck pain 06/10/2008   Hyperlipidemia 02/26/2007   Smoker 02/26/2007   Psoriasis 02/26/2007    PCP: Randeen Laine LABOR, MD  REFERRING PROVIDER: Randeen Laine LABOR, MD  REFERRING DIAG: M54.41 (ICD-10-CM) - Acute right-sided low back pain with right-sided sciatica   THERAPY DIAG:  Other low back pain  Muscle spasm of back  Muscle weakness (generalized)  Difficulty in walking, not elsewhere classified  Rationale for Evaluation and Treatment: Rehabilitation  ONSET DATE: Chronic  SUBJECTIVE:   SUBJECTIVE STATEMENT:  Pt's daughter Tammy was present during the eval and assisted with some subjective questions.  Chronic pain which has increased over the past couple of months. Back pain increased after helping a daughter with moving. At that time, she was more active packing boxes, but not lifting anything heavy.  PERTINENT HISTORY: Mild cognitive impairment, Bilat ant THA, cervical fusion 1977  PAIN:  Are you having pain? Yes: NPRS scale: Pt had difficulty rating pain  on NPRS scale- generally mod to high level of pain  Pain location: R low back  Pain description: Ache Aggravating factors: Standing and walking Relieving factors: Sitting, heating pad, tylenol   PRECAUTIONS: None  RED FLAGS: None   WEIGHT BEARING RESTRICTIONS: No  FALLS:  Has patient fallen in last 6 months? No  LIVING ENVIRONMENT: Lives with: lives with their family Lives in: House/apartment Able to access home  OCCUPATION: Retired  PLOF: Independent  PATIENT GOALS: Less pain  NEXT MD VISIT: Nothing scheduled at this time  OBJECTIVE:   Note: Objective measures were completed at Evaluation unless otherwise noted.  DIAGNOSTIC FINDINGS:  12/22/23 Lumbar spine IMPRESSION: 1. No acute abnormality of the lumbar spine. 2. Chronic degenerative change.  PATIENT SURVEYS:  Modified Oswestry: 19/50=38%  Interpretation of scores: Score Category Description  0-20% Minimal Disability The patient can cope with most living activities. Usually no treatment is indicated apart from advice on lifting, sitting and exercise  21-40% Moderate Disability The patient experiences more pain and difficulty with sitting, lifting and standing. Travel and social life are more difficult and they may be disabled from work. Personal care, sexual activity and sleeping are not grossly affected, and the patient can usually be managed by conservative means  41-60% Severe Disability Pain remains the main problem in this group, but activities of daily living are affected. These patients require a detailed investigation  61-80% Crippled Back pain impinges on all aspects of the patient's life. Positive intervention is required  81-100% Bed-bound These patients are either bed-bound or exaggerating their symptoms    Minimally Clinically Important Difference (MCID) = 12.8%  COGNITION: Overall cognitive status: Within functional limits for tasks assessed     SENSATION: WFL  EDEMA:  NT  MUSCLE LENGTH: Hamstrings: Right 45 deg; Left 45 deg Thomas test: Right Tight deg; Left tight deg  POSTURE: rounded shoulders, forward head, decreased lumbar lordosis, and increased thoracic kyphosis  PALPATION: TTP of the R low back with marked muscle tightness of the R paraspinals and QL muscles  LUMBAR ROM:   AROM eval  Flexion 50% limitation, tightness R low back  Extension 50% limitation, stiffness of low back  Right lateral flexion 75% limitation, R low back pain  Left lateral flexion 75% limitation, R low back pain  Right rotation 50% limited, R rib cage  pain  Left rotation 50% limited, L rib cage pain   (Blank rows = not tested)  LOWER EXTREMITY ROM:  WFLs Active ROM Right eval Left eval  Hip flexion    Hip extension    Hip abduction    Hip adduction    Hip internal rotation    Hip external rotation    Knee flexion    Knee extension    Ankle dorsiflexion    Ankle plantarflexion    Ankle inversion    Ankle eversion     (Blank rows = not tested)  LOWER EXTREMITY MMT:  Weak core, general hip weakness MMT Right eval Left eval  Hip flexion 4 4  Hip extension    Hip abduction 4 4  Hip adduction    Hip internal rotation    Hip external rotation 4 4  Knee flexion    Knee extension    Ankle dorsiflexion    Ankle plantarflexion    Ankle inversion    Ankle eversion     (Blank rows = not tested)  LOWER EXTREMITY SPECIAL TESTS:  SLR and slump test were negative  FUNCTIONAL TESTS:  5  times sit to stand: TBA  GAIT: Distance walked: 200' Assistive device utilized: None Level of assistance: Complete Independence Comments: WNLs                                                                                                                                TREATMENT DATE:  [facility] Adult PT Treatment:                                                DATE: 01/09/24 Therapeutic Exercise: Developed, instructed in, and pt completed therex as noted in HEP  Self Care: Findings and the purpose of PT   Care at home options: TENs unit which pt has used in the past, heating pad, massage, massage gun  PATIENT EDUCATION:  Education details: Eval findings, POC, HEP, self care  Person educated: Patient Education method: Explanation, Demonstration, Tactile cues, Verbal cues, and Handouts Education comprehension: verbalized understanding, returned demonstration, verbal cues required, and tactile cues required  HOME EXERCISE PROGRAM: Access Code: [email or web address] URL: [email or web address] Date: 01/09/2024 Prepared by: Dasie Daft  Exercises - Seated Flexion Stretch with Swiss Ball  - 3 x daily - 7 x weekly - 1 sets - 10 reps - 3-20 hold - Seated Flexion Stretch with Swiss Ball  - 3 x daily - 7 x weekly - 1 sets - 10 reps - 3-20 hold - Seated Hamstring Stretch  - 3 x daily - 7 x weekly - 1 sets - 3 reps - 20 hold - Supine Lower Trunk Rotation  - 3 x daily - 7 x weekly - 1 sets - 10 reps - 5 hold  ASSESSMENT:  CLINICAL IMPRESSION: Patient is a 73 y.o. female who was seen today for physical therapy evaluation and treatment for M54.41 (ICD-10-CM) - Acute right-sided low back pain with right-sided sciatica. Pt presents with the following deficits: pain; decreased function- tolerance to standing, walking, bending; moderate limitation to trunk mobility, marked increased muscle tightness of the R paraspinal and QL muscles. A HEP and self care was started as documented above. Pt will benefit from skilled PT 2w10 to address impairments to optimize back function with less pain. Pt may have some limitations with attending PT over the holidays with her spending time with [facility] family.  OBJECTIVE IMPAIRMENTS: decreased activity tolerance, difficulty walking, decreased ROM, decreased strength, increased muscle spasms, impaired flexibility, improper body mechanics, and pain.   ACTIVITY LIMITATIONS: carrying, lifting, bending, standing, squatting, sleeping, and locomotion level  PARTICIPATION LIMITATIONS: meal prep, cleaning, and laundry  PERSONAL FACTORS: Past/current experiences and Time since onset of injury/illness/exacerbation are also affecting patient's functional outcome.   REHAB POTENTIAL: Good  CLINICAL DECISION MAKING: Evolving/moderate complexity  EVALUATION COMPLEXITY: Moderate   GOALS:  SHORT TERM GOALS: Target date: 02/02/24 Pt will be Ind in an initial HEP  Baseline:started Goal status: INITIAL  2.  Pt will report improvement of her R low back pain to primarily moderate Baseline: mod to high levels of  pain Goal status: INITIAL   LONG TERM GOALS: Target date: 03/29/24  Pt will be Ind in a final HEP to maintain achieved LOF  Baseline: started Goal status: INITIAL  2.  Pt will report improvement of her R low back pain to primarily minimal Baseline: mod to high levels of pain Goal status: INITIAL  3.  Increase hamstring flexibility to 60d or greater to decreased low back strain Baseline: 45d Goal status: INITIAL  4.  Increase trunk ROM to min limitation for improved function and QOL Baseline: see flow sheet Goal status: INITIAL  5.  Pt's Mod Oswestry score by the MCID to 25% as indication of improved function  Baseline: 38% Goal status: INITIAL  6.  Improve 5xSTS by MCID as indication of improved functional mobility  Baseline: TBA Goal status: INITIAL   PLAN:  PT FREQUENCY: 1-2x/week  PT DURATION: 10 weeks  PLANNED INTERVENTIONS: 97164- PT Re-evaluation, 97110-Therapeutic exercises, 97530- Therapeutic activity, 97112- Neuromuscular re-education, 97535- Self Care, [id number]- Manual therapy, [id number]- Aquatic Therapy, [id number]- Electrical stimulation (unattended), 20560 (1-2 muscles), 20561 (3+ muscles)- Dry Needling, Patient/Family education, Joint mobilization, Spinal mobilization, Cryotherapy, and Moist heat  PLAN FOR NEXT SESSION: Assess 5xSTS; assess response to HEP; progress therex as indicated; use of modalities, manual therapy; and TPDN as indicated.   Ferguson Gertner MS, PT 01/10/24 8:35 AM   Referring diagnosis?M54.41 (ICD-10-CM) - Acute right-sided low back pain with right-sided sciatica   Treatment diagnosis? (if different than referring diagnosis)  Other low back pain Muscle spasm of back Muscle weakness (generalized) Difficulty in walking, not elsewhere classified  What was this (referring dx) caused by? []  Surgery []  Fall [x]  Ongoing issue [x]  Arthritis [x]  Other: _Muscle spasm___________  Laterality: [x]  Rt []  Lt []  Both  Check all possible CPT  codes:  *CHOOSE 10 OR LESS*    See Planned Interventions listed in the Plan section of the Evaluation.

## 2024-01-09 ENCOUNTER — Ambulatory Visit: Attending: Family Medicine

## 2024-01-09 ENCOUNTER — Other Ambulatory Visit: Payer: Self-pay

## 2024-01-09 DIAGNOSIS — M5441 Lumbago with sciatica, right side: Secondary | ICD-10-CM | POA: Insufficient documentation

## 2024-01-09 DIAGNOSIS — M5459 Other low back pain: Secondary | ICD-10-CM | POA: Insufficient documentation

## 2024-01-09 DIAGNOSIS — R262 Difficulty in walking, not elsewhere classified: Secondary | ICD-10-CM | POA: Diagnosis not present

## 2024-01-09 DIAGNOSIS — M6281 Muscle weakness (generalized): Secondary | ICD-10-CM | POA: Diagnosis not present

## 2024-01-09 DIAGNOSIS — M62830 Muscle spasm of back: Secondary | ICD-10-CM | POA: Insufficient documentation

## 2024-02-23 ENCOUNTER — Ambulatory Visit: Payer: Self-pay

## 2024-02-23 NOTE — Telephone Encounter (Signed)
Left VM requesting pt/family to call the office back  

## 2024-02-23 NOTE — Telephone Encounter (Signed)
 Aware, will watch for correspondence Thanks for seeing her Dr Gasper  Agree with ER/UC precautions   Mucinex may be helpful to loosen phlegm so she can move it better

## 2024-02-23 NOTE — Telephone Encounter (Signed)
 FYI Only or Action Required?: FYI only for provider: appointment scheduled on 02/26/24.  Patient was last seen in primary care on 12/05/2023 by Randeen Laine LABOR, MD.  Called Nurse Triage reporting Cough.  Symptoms began several days ago.  Interventions attempted: OTC medications: has tried multiple cough syrups.  Symptoms are: gradually worsening.  Triage Disposition: See Physician Within 24 Hours  Patient/caregiver understands and will follow disposition?: Yes  Reason for Disposition  [1] Continuous (nonstop) coughing interferes with work or school AND [2] no improvement using cough treatment per Care Advice  Answer Assessment - Initial Assessment Questions Spoke with patient's daughters, Jamie and Natasha, who state that patient has chronic cough but this cough started about 5 days ago. Report green sputum versus patient's normal clear sputum. Patient has SOB and wheezing at baseline and daughters states that this has not worsened, inhaler is working well for patient. Patient is having a difficult time with cough and choking while trying to cough up sputum. She has been able to cough some mucus out, but feels it is stuck. Daughter states she has given her multiple types of cough medicines and nothing has helped. Office visit advised. Requesting medication to help with cough.   1. ONSET: When did the cough begin?      Has chronic cough, but this worsening cough started 5 days ago  2. SEVERITY: How bad is the cough today?      Moderate  3. SPUTUM: Describe the color of your sputum (e.g., none, dry cough; clear, white, yellow, green)     Green  4. HEMOPTYSIS: Are you coughing up any blood? If Yes, ask: How much? (e.g., flecks, streaks, tablespoons, etc.)     No  5. DIFFICULTY BREATHING: Are you having difficulty breathing? If Yes, ask: How bad is it? (e.g., mild, moderate, severe)      Mild SOB at baseline, but denies any worsening symptoms  6. FEVER: Do you have a  fever? If Yes, ask: What is your temperature, how was it measured, and when did it start?     No  7. CARDIAC HISTORY: Do you have any history of heart disease? (e.g., heart attack, congestive heart failure)      No  8. LUNG HISTORY: Do you have any history of lung disease?  (e.g., pulmonary embolus, asthma, emphysema)     Smoker, chronic cough  9. PE RISK FACTORS: Do you have a history of blood clots? (or: recent major surgery, recent prolonged travel, bedridden)     No  10. OTHER SYMPTOMS: Do you have any other symptoms? (e.g., runny nose, wheezing, chest pain)       Runny, sinus pressure, wheezing, but states this is baseline for patient  11. PREGNANCY: Is there any chance you are pregnant? When was your last menstrual period?       NA  12. TRAVEL: Have you traveled out of the country in the last month? (e.g., travel history, exposures)       Unkown  Protocols used: Cough - Acute Productive-A-AH  Copied from CRM [id number]. Topic: Clinical - Red Word Triage >> Feb 23, 2024 10:16 AM Kevelyn M wrote: Red Word that prompted transfer to Nurse Triage: Patient's daughter calling in because the patient has had a cough and congestion for a week and a half. Patient has COPD and dementia. The medication she's taking is not working and they really would like her lungs checked. The earliest appointment was for Monday at [facility]. Made an appointment  on Monday, January 5th at 1pm.

## 2024-02-26 ENCOUNTER — Ambulatory Visit: Admitting: Family Medicine

## 2024-02-26 ENCOUNTER — Encounter: Payer: Self-pay | Admitting: Family Medicine

## 2024-02-26 VITALS — BP 140/50 | HR 68 | Resp 16 | Ht 60.0 in | Wt 130.0 lb

## 2024-02-26 DIAGNOSIS — R052 Subacute cough: Secondary | ICD-10-CM | POA: Diagnosis not present

## 2024-02-26 NOTE — Patient Instructions (Addendum)
 Please review the attached list of medications and notify my office if there are any errors.   I recommend that you get the RSV vaccine (Arexvy) to protect yourself from certain types of viral pneumonia. This vaccine is typically covered under Medicare prescription plans and should be covered if you get it at your pharmacy.

## 2024-02-26 NOTE — Progress Notes (Signed)
 "     Established patient visit   Patient: Tammy Neal   DOB: 07-Jun-1950   74 y.o. Female  MRN: [id number] Visit Date: 02/26/2024  Today's healthcare provider: Nancyann Perry, MD   Chief Complaint  Patient presents with   Acute Visit   Cough   Nasal Congestion    Cough and congestion   Subjective    Discussed the use of AI scribe software for clinical note transcription with the patient, who gave verbal consent to proceed.  History of Present Illness   Tammy Neal is a 74 year old female with COPD who presents with a persistent cough.  She has been experiencing a persistent cough for approximately one and a half to two weeks. Initially, the cough was severe, causing nonstop coughing and preventing sleep both at night and during the day. This lack of sleep was particularly challenging due to her dementia, leading to exhaustion for both her and her caregivers.  The cough is described as deep with significant phlegm production, which is discolored but not dark green. The use of Mucinex has helped in breaking up the cough, leading to some improvement. Despite this, she continues to experience wheezing and has been using her inhaler more frequently, especially at night when symptoms worsen.  She has a history of COPD and has been prescribed prednisone  in the past for flare-ups. She has not used her inhaler today. No nasal congestion or other cold symptoms aside from the cough are present.  Her sleep has improved recently, with last night being the first night she got good sleep.       Medications: Show/hide medication list[1] Review of Systems     Objective    BP (!) 140/50 (BP Location: Left Arm, Patient Position: Sitting, Cuff Size: Normal)   Pulse 68   Resp 16   Ht 5' (1.524 m)   Wt 130 lb (59 kg)   SpO2 99%   BMI 25.39 kg/m    General: Appearance:    Well developed, well nourished female in no acute distress  Eyes:    PERRL, conjunctiva/corneas clear, EOM's  intact       Lungs:     Clear to auscultation bilaterally, respirations unlabored  Heart:    Normal heart rate. Normal rhythm. No murmurs, rubs, or gallops.    MS:   All extremities are intact.    Neurologic:   Awake, alert, oriented x 3. No apparent focal neurological defect.        Assessment & Plan       Subacute cough Cough improved with Mucinex, likely resolving viral infection. - Use inhaler before bed for nighttime symptoms.  Chronic obstructive pulmonary disease COPD exacerbation resolved, symptoms improved. - Use inhaler before bed for nighttime symptoms. - Consider prednisone  if symptoms flare.  General Health Maintenance RSV vaccine recommended due to COPD risk. - Recommended RSV vaccine from pharmacy.     Call if symptoms change or if not rapidly improving.        Nancyann Perry, MD  [facility] [phone] (phone) [phone] (fax)  [facility]    [1]  Outpatient Medications Prior to Visit  Medication Sig   acetaminophen  (TYLENOL ) 160 MG/5ML liquid Take 480 mg by mouth every 4 (four) hours as needed for pain.   albuterol  (VENTOLIN  HFA) 108 (90 Base) MCG/ACT inhaler Inhale 2 puffs into the lungs every 4 (four) hours as needed for wheezing or shortness of breath.  amLODipine  (NORVASC ) 5 MG tablet Take 1 tablet (5 mg total) by mouth daily.   busPIRone  (BUSPAR ) 15 MG tablet TAKE ONE QUARTER (1/4) TABLET BY MOUTH TWICE A DAY   calcium  carbonate (TITRALAC) 420 MG CHEW chewable tablet Chew 420 mg by mouth.   Cholecalciferol (VITAMIN D3) 10 MCG (400 UNIT) CHEW Chew by mouth.   Cyanocobalamin  (VITAMIN B-12 PO) Take 3,000 mcg by mouth in the morning. Gummies   famotidine  (PEPCID ) 20 MG tablet TAKE ONE TABLET (20 MG TOTAL) BY MOUTH DAILY.   menthol -zinc oxide (GOLD BOND) powder Apply topically.   mupirocin  ointment (BACTROBAN ) 2 % Apply 1 Application topically 2 (two) times daily.   nystatin  cream (MYCOSTATIN ) Apply 1  Application topically 2 (two) times daily as needed for dry skin (yeast rash).   Salicylic Acid (CERAVE PSORIASIS) 2 % CREA Apply topically.   levothyroxine  (SYNTHROID ) 50 MCG tablet Take 1 tablet (50 mcg total) by mouth daily before breakfast.   memantine  (NAMENDA ) 10 MG tablet Take 1 tablet (10 mg total) by mouth 2 (two) times daily.   predniSONE  (DELTASONE ) 20 MG tablet Take 1 tablet (20 mg total) by mouth daily with breakfast. (Patient not taking: Reported on 02/26/2024)   rosuvastatin  (CRESTOR ) 10 MG tablet TAKE 1 TABLET BY MOUTH ONCE DAILY   No facility-administered medications prior to visit.   "

## 2024-02-28 NOTE — Telephone Encounter (Signed)
 Pt was seen on 02/26/24 for this

## 2024-03-01 ENCOUNTER — Telehealth: Payer: Self-pay | Admitting: *Deleted

## 2024-03-01 MED ORDER — PREDNISONE 10 MG PO TABS: ORAL_TABLET | ORAL | 0 refills | Status: AC

## 2024-03-01 MED ORDER — PROMETHAZINE-DM 6.25-15 MG/5ML PO SYRP: 5.0000 mL | ORAL_SOLUTION | Freq: Three times a day (TID) | ORAL | 0 refills | Status: AC | PRN

## 2024-03-01 NOTE — Telephone Encounter (Signed)
 Will route to PCP since pt was seen at another [facility] office for this issue

## 2024-03-01 NOTE — Telephone Encounter (Signed)
I did send it.

## 2024-03-01 NOTE — Telephone Encounter (Signed)
 Copied from CRM #[id number]. Topic: Clinical - Medication Question >> Mar 01, 2024 11:12 AM Burnard DEL wrote: Reason for CRM: Patients daughter called in stating that her mom still has a horrible cough that causes her to be up all night coughing. She has been taking the tessalon  pearls,however they aren't working. She would like to know if she could be prescribed some cough medicine?  [facility] - [place], [place] - [address]  Phone: [phone] Fax: [phone]

## 2024-03-01 NOTE — Telephone Encounter (Signed)
 Spoke with daughter and she does want to try a round of prednisone , she said even though the provider they saw said her lungs sound clear pt has been wheezing daily. She has been wheezing to the point they are giving her an inhaler nightly to help her.   [facility]

## 2024-03-01 NOTE — Telephone Encounter (Signed)
 I sent prometh  DM   Do they think she needs prednisone ?  ? Wheezing

## 2024-11-11 ENCOUNTER — Ambulatory Visit
